# Patient Record
Sex: Male | Born: 1935
Health system: Southern US, Community
[De-identification: ages and names within clinical notes are randomized; demographics above are authoritative.]

## PROBLEM LIST (undated history)

## (undated) DIAGNOSIS — N200 Calculus of kidney: Secondary | ICD-10-CM

## (undated) DIAGNOSIS — E785 Hyperlipidemia, unspecified: Secondary | ICD-10-CM

## (undated) DIAGNOSIS — M1990 Unspecified osteoarthritis, unspecified site: Secondary | ICD-10-CM

## (undated) DIAGNOSIS — K5792 Diverticulitis of intestine, part unspecified, without perforation or abscess without bleeding: Secondary | ICD-10-CM

## (undated) DIAGNOSIS — K589 Irritable bowel syndrome without diarrhea: Secondary | ICD-10-CM

## (undated) DIAGNOSIS — I10 Essential (primary) hypertension: Secondary | ICD-10-CM

## (undated) DIAGNOSIS — K219 Gastro-esophageal reflux disease without esophagitis: Secondary | ICD-10-CM

## (undated) HISTORY — DX: Irritable bowel syndrome, unspecified: K58.9

## (undated) HISTORY — DX: Calculus of kidney: N20.0

## (undated) HISTORY — DX: Essential (primary) hypertension: I10

## (undated) HISTORY — DX: Hyperlipidemia, unspecified: E78.5

## (undated) HISTORY — PX: BACK SURGERY: SHX140

## (undated) HISTORY — DX: Diverticulitis of intestine, part unspecified, without perforation or abscess without bleeding: K57.92

## (undated) HISTORY — DX: Unspecified osteoarthritis, unspecified site: M19.90

## (undated) HISTORY — DX: Gastro-esophageal reflux disease without esophagitis: K21.9

## (undated) HISTORY — PX: OTHER SURGICAL HISTORY: SHX169

---
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
## 1998-11-27 ENCOUNTER — Emergency Department (HOSPITAL_COMMUNITY): Admission: EM | Admit: 1998-11-27 | Discharge: 1998-11-27 | Payer: Self-pay | Admitting: Emergency Medicine

## 1998-11-28 ENCOUNTER — Encounter: Payer: Self-pay | Admitting: Emergency Medicine

## 2000-07-01 ENCOUNTER — Encounter (INDEPENDENT_AMBULATORY_CARE_PROVIDER_SITE_OTHER): Payer: Self-pay | Admitting: Specialist

## 2000-07-01 ENCOUNTER — Other Ambulatory Visit: Admission: RE | Admit: 2000-07-01 | Discharge: 2000-07-01 | Payer: Self-pay | Admitting: Internal Medicine

## 2002-10-08 ENCOUNTER — Encounter: Payer: Self-pay | Admitting: Emergency Medicine

## 2002-10-08 ENCOUNTER — Emergency Department (HOSPITAL_COMMUNITY): Admission: EM | Admit: 2002-10-08 | Discharge: 2002-10-08 | Payer: Self-pay | Admitting: *Deleted

## 2002-10-29 ENCOUNTER — Ambulatory Visit (HOSPITAL_COMMUNITY): Admission: RE | Admit: 2002-10-29 | Discharge: 2002-10-29 | Payer: Self-pay | Admitting: Internal Medicine

## 2004-04-09 ENCOUNTER — Ambulatory Visit: Payer: Self-pay | Admitting: Internal Medicine

## 2004-05-09 ENCOUNTER — Ambulatory Visit: Payer: Self-pay | Admitting: Internal Medicine

## 2004-05-16 ENCOUNTER — Ambulatory Visit: Payer: Self-pay | Admitting: Internal Medicine

## 2004-06-20 ENCOUNTER — Ambulatory Visit: Payer: Self-pay | Admitting: Internal Medicine

## 2004-07-02 ENCOUNTER — Ambulatory Visit: Payer: Self-pay | Admitting: Internal Medicine

## 2004-07-11 ENCOUNTER — Ambulatory Visit: Payer: Self-pay | Admitting: Internal Medicine

## 2004-08-24 ENCOUNTER — Ambulatory Visit: Payer: Self-pay | Admitting: Internal Medicine

## 2004-09-14 ENCOUNTER — Ambulatory Visit: Payer: Self-pay | Admitting: Internal Medicine

## 2004-09-19 ENCOUNTER — Ambulatory Visit: Payer: Self-pay | Admitting: Family Medicine

## 2004-09-21 ENCOUNTER — Ambulatory Visit: Payer: Self-pay | Admitting: Internal Medicine

## 2004-09-25 ENCOUNTER — Ambulatory Visit: Payer: Self-pay | Admitting: Internal Medicine

## 2004-11-23 ENCOUNTER — Ambulatory Visit: Payer: Self-pay | Admitting: Internal Medicine

## 2004-12-31 ENCOUNTER — Ambulatory Visit: Payer: Self-pay | Admitting: Internal Medicine

## 2005-01-02 ENCOUNTER — Ambulatory Visit: Payer: Self-pay | Admitting: Internal Medicine

## 2005-01-07 ENCOUNTER — Ambulatory Visit: Payer: Self-pay | Admitting: Internal Medicine

## 2005-01-11 ENCOUNTER — Ambulatory Visit: Payer: Self-pay | Admitting: Internal Medicine

## 2005-03-25 ENCOUNTER — Ambulatory Visit: Payer: Self-pay | Admitting: Internal Medicine

## 2005-04-10 ENCOUNTER — Ambulatory Visit: Payer: Self-pay | Admitting: Internal Medicine

## 2005-06-14 ENCOUNTER — Ambulatory Visit: Payer: Self-pay | Admitting: Internal Medicine

## 2005-06-27 ENCOUNTER — Ambulatory Visit: Payer: Self-pay | Admitting: Internal Medicine

## 2005-11-05 ENCOUNTER — Ambulatory Visit: Payer: Self-pay | Admitting: Internal Medicine

## 2005-11-19 ENCOUNTER — Ambulatory Visit: Payer: Self-pay | Admitting: Internal Medicine

## 2006-07-18 ENCOUNTER — Ambulatory Visit: Payer: Self-pay | Admitting: Internal Medicine

## 2006-07-18 LAB — CONVERTED CEMR LAB
ALT: 17 units/L (ref 0–40)
AST: 18 units/L (ref 0–37)
Albumin: 3.9 g/dL (ref 3.5–5.2)
Alkaline Phosphatase: 52 units/L (ref 39–117)
BUN: 17 mg/dL (ref 6–23)
Basophils Absolute: 0.4 10*3/uL — ABNORMAL HIGH (ref 0.0–0.1)
Basophils Relative: 4.6 % — ABNORMAL HIGH (ref 0.0–1.0)
Bilirubin, Direct: 0.2 mg/dL (ref 0.0–0.3)
CO2: 25 meq/L (ref 19–32)
Calcium: 9.4 mg/dL (ref 8.4–10.5)
Chloride: 107 meq/L (ref 96–112)
Cholesterol: 208 mg/dL (ref 0–200)
Creatinine, Ser: 1.3 mg/dL (ref 0.4–1.5)
Direct LDL: 106.4 mg/dL
Eosinophils Absolute: 0.3 10*3/uL (ref 0.0–0.6)
Eosinophils Relative: 3.9 % (ref 0.0–5.0)
GFR calc Af Amer: 70 mL/min
GFR calc non Af Amer: 58 mL/min
Glucose, Bld: 92 mg/dL (ref 70–99)
HCT: 42.5 % (ref 39.0–52.0)
HDL: 73 mg/dL (ref >39.0)
Hemoglobin: 14.7 g/dL (ref 13.0–17.0)
Lymphocytes Relative: 25.4 % (ref 12.0–46.0)
MCHC: 34.7 g/dL (ref 30.0–36.0)
MCV: 102.7 fL — ABNORMAL HIGH (ref 78.0–100.0)
Monocytes Absolute: 0.6 10*3/uL (ref 0.2–0.7)
Monocytes Relative: 7.1 % (ref 3.0–11.0)
Neutro Abs: 4.9 10*3/uL (ref 1.4–7.7)
Neutrophils Relative %: 59 % (ref 43.0–77.0)
PSA: 0.9 ng/mL (ref 0.10–4.00)
Platelets: 212 10*3/uL (ref 150–400)
Potassium: 4.6 meq/L (ref 3.5–5.1)
RBC: 4.14 M/uL — ABNORMAL LOW (ref 4.22–5.81)
RDW: 13 % (ref 11.5–14.6)
Sodium: 139 meq/L (ref 135–145)
TSH: 0.89 microintl units/mL (ref 0.35–5.50)
Total Bilirubin: 0.9 mg/dL (ref 0.3–1.2)
Total CHOL/HDL Ratio: 2.8
Total Protein: 6 g/dL (ref 6.0–8.3)
Triglycerides: 138 mg/dL (ref 0–149)
VLDL: 28 mg/dL (ref 0–40)
WBC: 8.3 10*3/uL (ref 4.5–10.5)

## 2006-07-25 ENCOUNTER — Ambulatory Visit: Payer: Self-pay | Admitting: Internal Medicine

## 2006-08-01 DIAGNOSIS — E039 Hypothyroidism, unspecified: Secondary | ICD-10-CM

## 2006-08-01 DIAGNOSIS — E785 Hyperlipidemia, unspecified: Secondary | ICD-10-CM | POA: Insufficient documentation

## 2006-08-01 DIAGNOSIS — K5730 Diverticulosis of large intestine without perforation or abscess without bleeding: Secondary | ICD-10-CM | POA: Insufficient documentation

## 2006-08-11 ENCOUNTER — Ambulatory Visit: Payer: Self-pay | Admitting: Internal Medicine

## 2006-11-13 ENCOUNTER — Ambulatory Visit: Payer: Self-pay | Admitting: Internal Medicine

## 2006-11-21 ENCOUNTER — Ambulatory Visit: Payer: Self-pay | Admitting: Internal Medicine

## 2006-11-21 DIAGNOSIS — C44300 Unspecified malignant neoplasm of skin of unspecified part of face: Secondary | ICD-10-CM | POA: Insufficient documentation

## 2006-11-21 DIAGNOSIS — C44309 Unspecified malignant neoplasm of skin of other parts of face: Secondary | ICD-10-CM | POA: Insufficient documentation

## 2006-11-28 ENCOUNTER — Ambulatory Visit: Payer: Self-pay | Admitting: Internal Medicine

## 2006-12-01 ENCOUNTER — Encounter: Payer: Self-pay | Admitting: Internal Medicine

## 2006-12-03 ENCOUNTER — Telehealth: Payer: Self-pay | Admitting: Internal Medicine

## 2007-04-22 ENCOUNTER — Ambulatory Visit: Payer: Self-pay | Admitting: Internal Medicine

## 2007-04-24 ENCOUNTER — Ambulatory Visit (HOSPITAL_COMMUNITY): Admission: RE | Admit: 2007-04-24 | Discharge: 2007-04-24 | Payer: Self-pay | Admitting: Internal Medicine

## 2007-04-28 ENCOUNTER — Encounter: Payer: Self-pay | Admitting: Internal Medicine

## 2007-04-28 ENCOUNTER — Ambulatory Visit: Payer: Self-pay | Admitting: Internal Medicine

## 2007-05-05 ENCOUNTER — Ambulatory Visit: Payer: Self-pay | Admitting: Internal Medicine

## 2007-05-05 LAB — CONVERTED CEMR LAB
Protein, U semiquant: NEGATIVE
Urobilinogen, UA: 0.2

## 2007-05-06 ENCOUNTER — Ambulatory Visit: Payer: Self-pay | Admitting: Internal Medicine

## 2007-05-06 DIAGNOSIS — T887XXA Unspecified adverse effect of drug or medicament, initial encounter: Secondary | ICD-10-CM | POA: Insufficient documentation

## 2007-05-06 LAB — CONVERTED CEMR LAB: BUN: 16 mg/dL (ref 6–23)

## 2007-05-07 ENCOUNTER — Ambulatory Visit: Payer: Self-pay | Admitting: Cardiology

## 2007-06-04 ENCOUNTER — Ambulatory Visit: Payer: Self-pay | Admitting: Internal Medicine

## 2007-06-05 ENCOUNTER — Ambulatory Visit: Payer: Self-pay | Admitting: Gastroenterology

## 2007-06-05 ENCOUNTER — Telehealth: Payer: Self-pay | Admitting: Gastroenterology

## 2007-06-09 ENCOUNTER — Ambulatory Visit (HOSPITAL_COMMUNITY): Admission: RE | Admit: 2007-06-09 | Discharge: 2007-06-09 | Payer: Self-pay | Admitting: Gastroenterology

## 2007-06-09 ENCOUNTER — Encounter: Payer: Self-pay | Admitting: Gastroenterology

## 2007-06-09 ENCOUNTER — Encounter: Payer: Self-pay | Admitting: Internal Medicine

## 2007-06-15 ENCOUNTER — Ambulatory Visit: Payer: Self-pay | Admitting: Gastroenterology

## 2007-08-19 ENCOUNTER — Telehealth: Payer: Self-pay | Admitting: Internal Medicine

## 2007-08-20 ENCOUNTER — Ambulatory Visit: Payer: Self-pay | Admitting: Internal Medicine

## 2007-08-21 ENCOUNTER — Encounter: Payer: Self-pay | Admitting: Internal Medicine

## 2007-11-13 ENCOUNTER — Ambulatory Visit: Payer: Self-pay | Admitting: Internal Medicine

## 2007-11-25 ENCOUNTER — Telehealth: Payer: Self-pay | Admitting: Internal Medicine

## 2008-05-04 ENCOUNTER — Encounter: Payer: Self-pay | Admitting: Internal Medicine

## 2008-05-17 ENCOUNTER — Encounter: Payer: Self-pay | Admitting: Internal Medicine

## 2008-08-19 ENCOUNTER — Ambulatory Visit: Payer: Self-pay | Admitting: Internal Medicine

## 2008-08-19 LAB — CONVERTED CEMR LAB
Bilirubin Urine: NEGATIVE
Glucose, Urine, Semiquant: NEGATIVE
Ketones, urine, test strip: NEGATIVE
Protein, U semiquant: NEGATIVE
Urobilinogen, UA: 0.2
pH: 5.5

## 2008-08-22 LAB — CONVERTED CEMR LAB
BUN: 19 mg/dL (ref 6–23)
Basophils Absolute: 0.1 10*3/uL (ref 0.0–0.1)
Bilirubin, Direct: 0.1 mg/dL (ref 0.0–0.3)
Chloride: 107 meq/L (ref 96–112)
Cholesterol: 175 mg/dL (ref 0–200)
Creatinine, Ser: 1.4 mg/dL (ref 0.4–1.5)
Eosinophils Absolute: 0.3 10*3/uL (ref 0.0–0.7)
Eosinophils Relative: 3.6 % (ref 0.0–5.0)
Glucose, Bld: 110 mg/dL — ABNORMAL HIGH (ref 70–99)
HCT: 42 % (ref 39.0–52.0)
LDL Cholesterol: 92 mg/dL (ref 0–99)
Lymphs Abs: 1.5 10*3/uL (ref 0.7–4.0)
MCHC: 34.6 g/dL (ref 30.0–36.0)
MCV: 102.9 fL — ABNORMAL HIGH (ref 78.0–100.0)
Monocytes Absolute: 0.6 10*3/uL (ref 0.1–1.0)
Neutrophils Relative %: 65.9 % (ref 43.0–77.0)
PSA: 1.03 ng/mL (ref 0.10–4.00)
Platelets: 207 10*3/uL (ref 150.0–400.0)
RDW: 13.1 % (ref 11.5–14.6)
TSH: 1.65 microintl units/mL (ref 0.35–5.50)
Total Bilirubin: 1.1 mg/dL (ref 0.3–1.2)
Triglycerides: 80 mg/dL (ref 0.0–149.0)
WBC: 7.3 10*3/uL (ref 4.5–10.5)

## 2008-10-10 ENCOUNTER — Encounter: Payer: Self-pay | Admitting: Internal Medicine

## 2008-10-13 ENCOUNTER — Encounter: Payer: Self-pay | Admitting: Internal Medicine

## 2008-10-25 ENCOUNTER — Encounter: Admission: RE | Admit: 2008-10-25 | Discharge: 2008-10-25 | Payer: Self-pay | Admitting: Orthopedic Surgery

## 2008-10-28 ENCOUNTER — Telehealth: Payer: Self-pay | Admitting: Internal Medicine

## 2008-11-08 ENCOUNTER — Encounter: Admission: RE | Admit: 2008-11-08 | Discharge: 2008-11-08 | Payer: Self-pay | Admitting: Orthopedic Surgery

## 2008-12-02 ENCOUNTER — Encounter (INDEPENDENT_AMBULATORY_CARE_PROVIDER_SITE_OTHER): Payer: Self-pay | Admitting: *Deleted

## 2008-12-04 ENCOUNTER — Emergency Department (HOSPITAL_COMMUNITY): Admission: EM | Admit: 2008-12-04 | Discharge: 2008-12-04 | Payer: Self-pay | Admitting: Emergency Medicine

## 2008-12-06 ENCOUNTER — Ambulatory Visit: Payer: Self-pay | Admitting: Internal Medicine

## 2008-12-06 DIAGNOSIS — M12519 Traumatic arthropathy, unspecified shoulder: Secondary | ICD-10-CM | POA: Insufficient documentation

## 2008-12-06 DIAGNOSIS — M48061 Spinal stenosis, lumbar region without neurogenic claudication: Secondary | ICD-10-CM | POA: Insufficient documentation

## 2008-12-13 ENCOUNTER — Telehealth: Payer: Self-pay | Admitting: Internal Medicine

## 2009-01-30 ENCOUNTER — Encounter: Payer: Self-pay | Admitting: Internal Medicine

## 2009-02-08 ENCOUNTER — Encounter: Payer: Self-pay | Admitting: Internal Medicine

## 2009-03-13 ENCOUNTER — Encounter: Payer: Self-pay | Admitting: Internal Medicine

## 2009-03-20 ENCOUNTER — Encounter: Payer: Self-pay | Admitting: Internal Medicine

## 2009-04-24 ENCOUNTER — Encounter: Payer: Self-pay | Admitting: Internal Medicine

## 2009-06-14 DIAGNOSIS — K589 Irritable bowel syndrome without diarrhea: Secondary | ICD-10-CM

## 2009-06-14 DIAGNOSIS — Z8711 Personal history of peptic ulcer disease: Secondary | ICD-10-CM

## 2009-06-14 DIAGNOSIS — Z860100 Personal history of colon polyps, unspecified: Secondary | ICD-10-CM | POA: Insufficient documentation

## 2009-06-14 DIAGNOSIS — K648 Other hemorrhoids: Secondary | ICD-10-CM | POA: Insufficient documentation

## 2009-06-14 DIAGNOSIS — K219 Gastro-esophageal reflux disease without esophagitis: Secondary | ICD-10-CM

## 2009-06-14 DIAGNOSIS — E739 Lactose intolerance, unspecified: Secondary | ICD-10-CM

## 2009-06-14 DIAGNOSIS — K5732 Diverticulitis of large intestine without perforation or abscess without bleeding: Secondary | ICD-10-CM | POA: Insufficient documentation

## 2009-06-14 DIAGNOSIS — Z87442 Personal history of urinary calculi: Secondary | ICD-10-CM

## 2009-06-19 ENCOUNTER — Ambulatory Visit: Payer: Self-pay | Admitting: Internal Medicine

## 2009-07-12 ENCOUNTER — Telehealth: Payer: Self-pay | Admitting: Internal Medicine

## 2009-07-17 ENCOUNTER — Ambulatory Visit: Payer: Self-pay | Admitting: Internal Medicine

## 2009-07-17 DIAGNOSIS — T07XXXA Unspecified multiple injuries, initial encounter: Secondary | ICD-10-CM | POA: Insufficient documentation

## 2009-07-17 DIAGNOSIS — M542 Cervicalgia: Secondary | ICD-10-CM | POA: Insufficient documentation

## 2009-07-27 ENCOUNTER — Encounter: Payer: Self-pay | Admitting: Internal Medicine

## 2009-10-23 ENCOUNTER — Encounter: Payer: Self-pay | Admitting: Internal Medicine

## 2009-11-23 ENCOUNTER — Ambulatory Visit: Payer: Self-pay | Admitting: Internal Medicine

## 2010-02-24 IMAGING — CR DG HUMERUS 2V *L*
3 series · 3 of 3 positions shown · non-contrast
Comparison: None

CLINICAL DATA: Fall.  Left proximal humeral pain.

LEFT HUMERUS - 2+ VIEW

[t humerus ap left (1 of 3)]
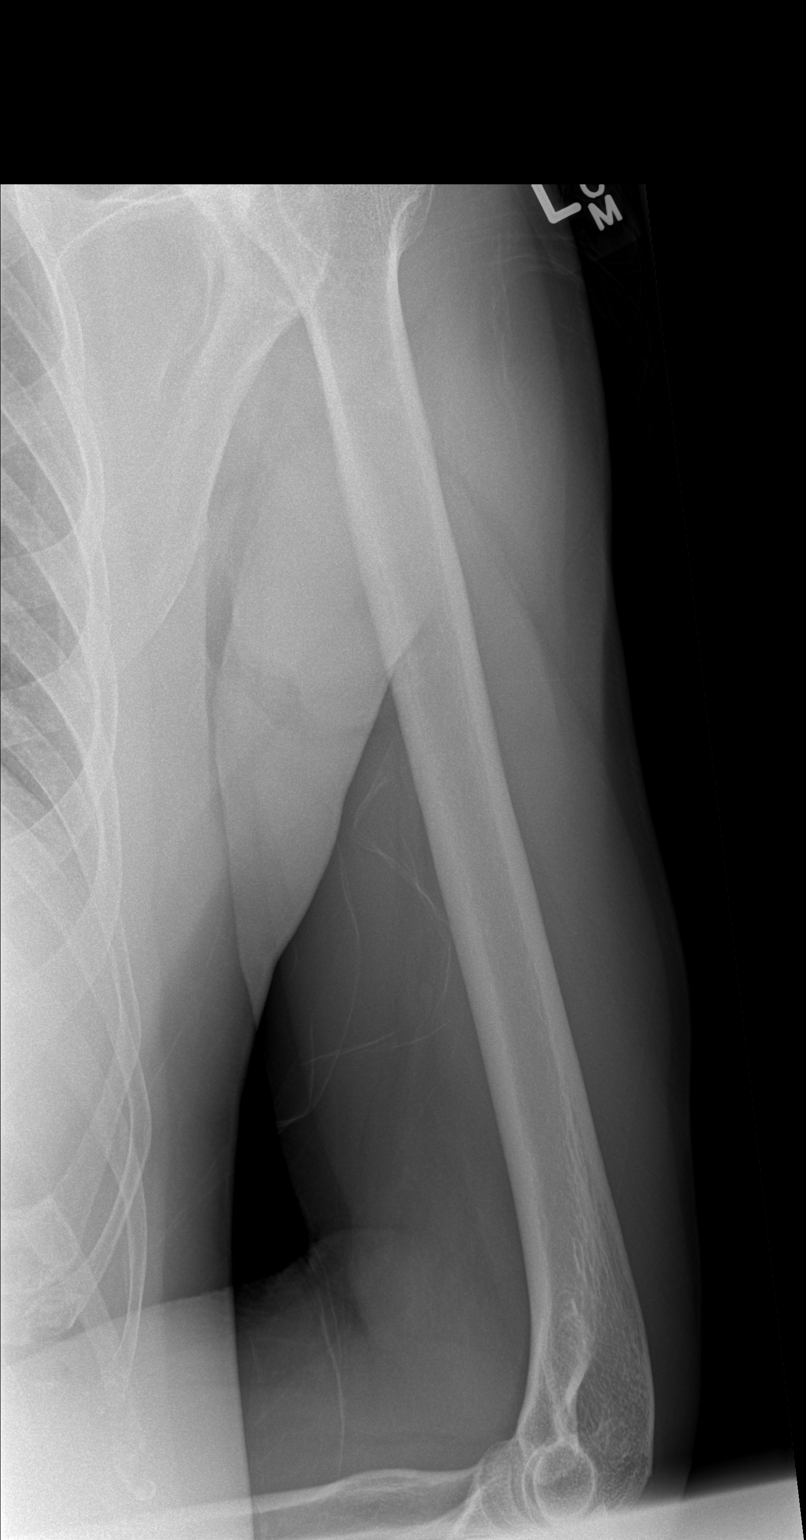

[t humerus ap left (2 of 3)]
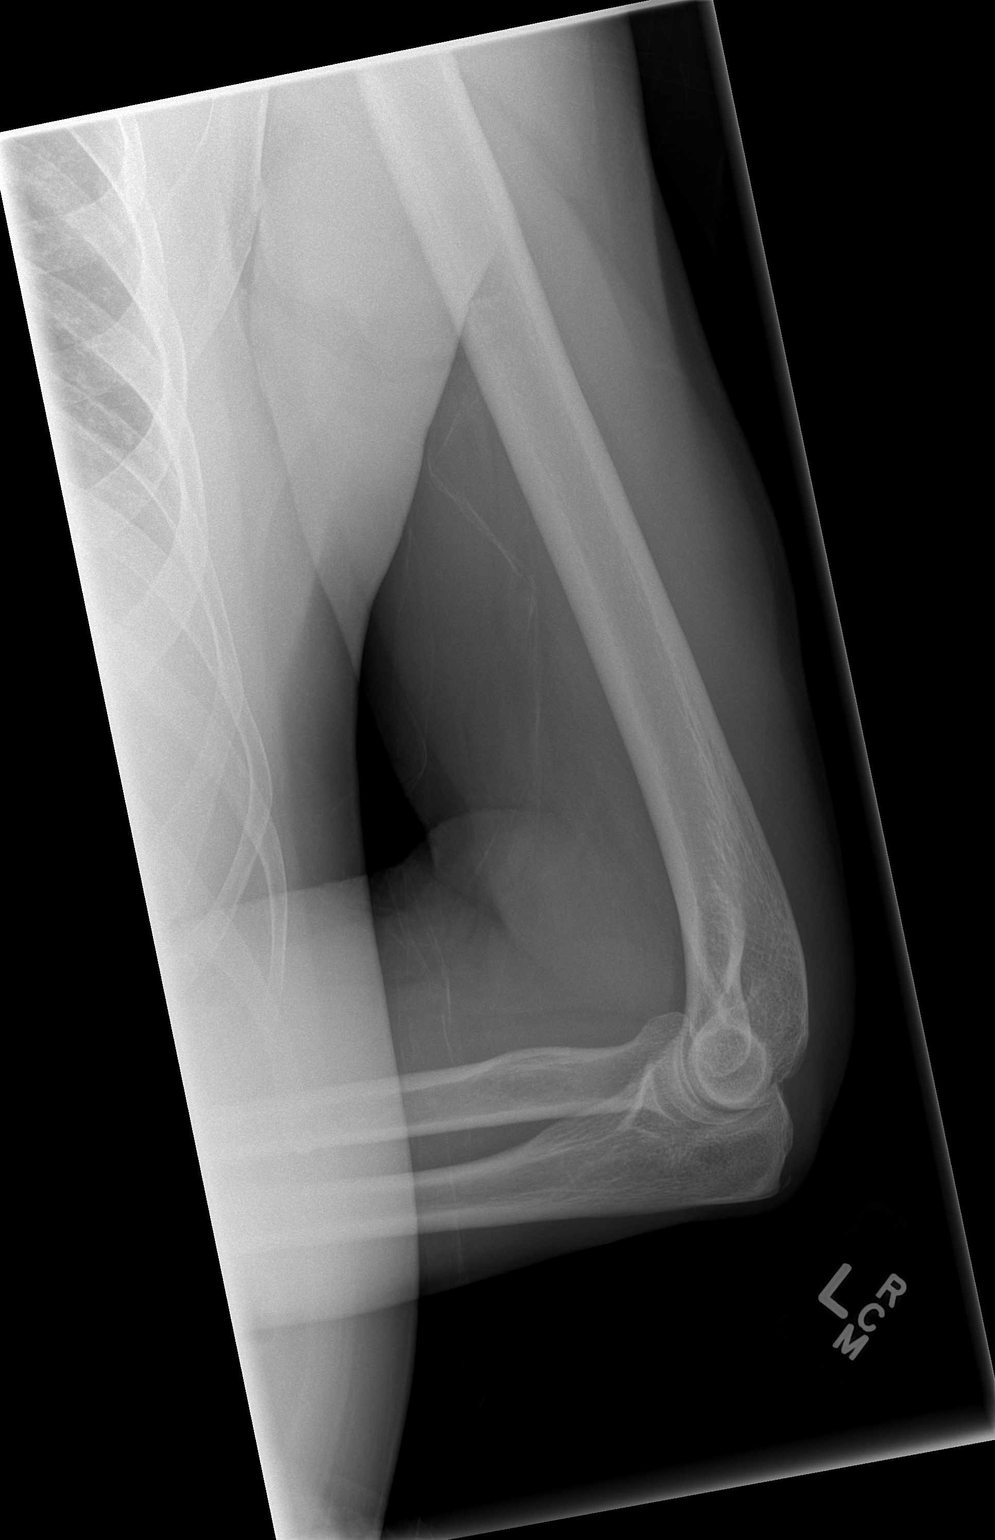

[t humerus ap left (3 of 3)]
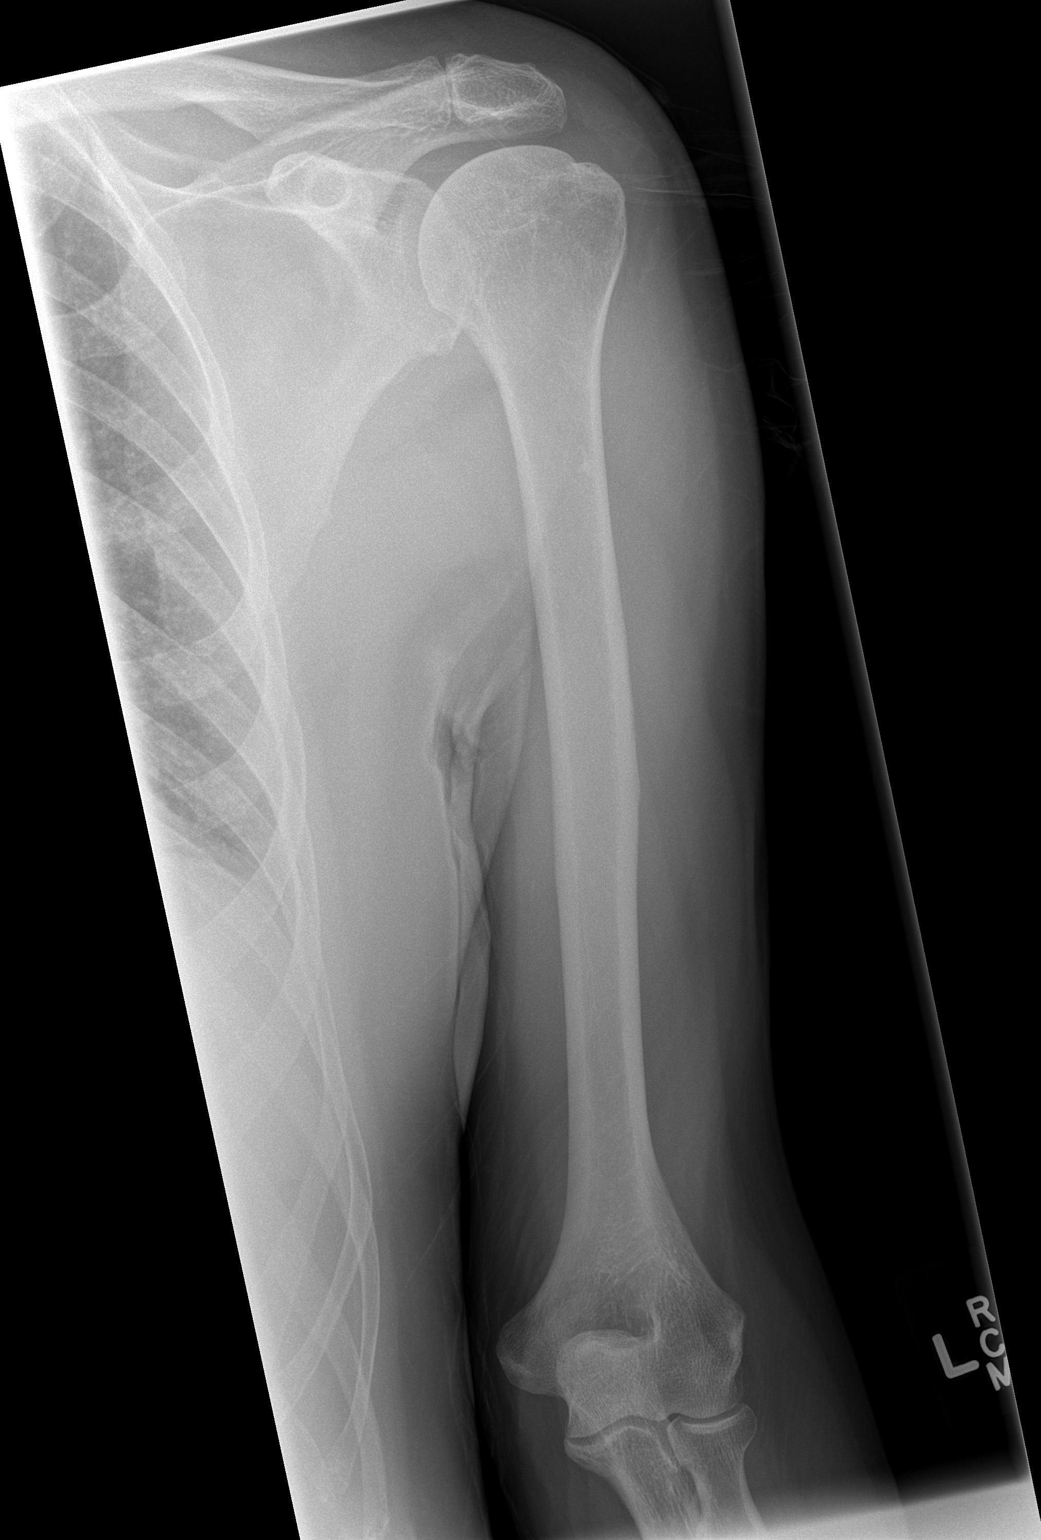

[3 of 3 positions shown; findings below may reference images not displayed]

FINDINGS: No humeral fracture noted.  No specific findings of
shoulder dislocation on this two view series.
IMPRESSION: 1.  No acute bony findings.

## 2010-03-15 NOTE — Assessment & Plan Note (Signed)
Summary: INJURIES FROM A FALL // RS   Vital Signs:  Patient profile:   77 year old male Weight:      155 pounds BMI:     24.36 Pulse rate:   78 / minute Pulse rhythm:   regular BP sitting:   120 / 76  (left arm) Cuff size:   regular  Vitals Entered By: Raechel Ache, RN (July 17, 2009 9:10 AM) CC: Larey Seat down 5 steps on deck yesterday afternoon. C/o pain neck, L shoulder, face, L thigh.   Primary Care Provider:  Birdie Sons, MD  CC:  Larey Seat down 5 steps on deck yesterday afternoon. C/o pain neck, L shoulder, face, and L thigh..  History of Present Illness: fell walking down steps yesterday (he still blames on left leg being numb after surgery).  fell and hit both forearms/face/thighs no weakness main issue now is that neck hurts especially when turning to right no radiation of pain no arm weakness  Reviewed immunization: tetanus UTD   Allergies (verified): No Known Drug Allergies  Past History:  Past Medical History: Last updated: 08/01/2006 Colonic polyps, hx of-adematous Diverticulosis, colon Hyperlipidemia Hypothyroidism congenital R ear deafness  Past Surgical History: Last updated: 07/16/09 Hemorrhoidectomy ulcer surgery  (vagotomy and surgical resection) 1964 T & A Back Surgery---January 2011  Family History: Last updated: 2009/07/16 mother--HTN deceased 59 yo father MI 39 yo sister MI 89 yo Family History of Stomach Cancer: Mother, Paternal Aunt Family History of Heart Disease: Maternal Uncle, Sister, Neice, Father No FH of Colon Cancer:  Social History: Last updated: 07/16/09 Married  Former Smoker--quit 1965 Alcohol Use - yes-very rare Illicit Drug Use - no  Risk Factors: Smoking Status: quit (12/06/2008)  Review of Systems       vision normal,chronic hearing loss All other systems reviewed and were negative   Physical Exam  General:  alert and well-developed.  multiple contusions around face no palpable tenderness to  face Eyes:  bruising around left eye Neck:  No deformities, masses, or tenderness noted. Lungs:  Normal respiratory effort, chest expands symmetrically. Lungs are clear to auscultation, no crackles or wheezes. Heart:  Normal rate and regular rhythm. S1 and S2 normal without gallop, murmur, click, rub or other extra sounds. Abdomen:  Bowel sounds positive,abdomen soft and non-tender without masses, organomegaly or hernias noted. Msk:  pain with rotating head to right DTRs normal   Impression & Recommendations:  Problem # 1:  CONTUSION OF MULTIPLE SITES NEC (ICD-924.8) suspect these will heal without problem  Problem # 2:  NECK PAIN (ICD-723.1)  given fall I'll send for xray suspect soft tissue injury/inflammation  side effects of meds discussed with patient and wife His updated medication list for this problem includes:    Aspirin 81 Mg Tbec (Aspirin) ..... Once daily    Diclofenac Sodium 50 Mg Tbec (Diclofenac sodium) .Marland Kitchen... 1 by mouth 2 times daily. take with food for 2 weeks    Hydrocodone-acetaminophen 5-325 Mg Tabs (Hydrocodone-acetaminophen) .Marland Kitchen... 1 by mouth up to 4 times per day as needed for pain  Orders: T-Cervical Spine Comp w/Flex & Ext (52841LK)  Complete Medication List: 1)  Lisinopril 20 Mg Tabs (Lisinopril) .... Take 1 tablet by mouth once a day 2)  Lipitor 20 Mg Tabs (Atorvastatin calcium) .... Take 1 tablet by mouth once a day 3)  Lorazepam 1 Mg Tabs (Lorazepam) .... Qd  as needed 4)  Aspirin 81 Mg Tbec (Aspirin) .... Once daily 5)  Dicyclomine Hcl 20 Mg  Tabs (Dicyclomine hcl) .... Take 1 tablet by mouth two times a day as needed 6)  Pantoprazole Sodium 40 Mg Tbec (Pantoprazole sodium) .... Take 1 tablet by mouth once a day 7)  Levothyroxine Sodium 50 Mcg Tabs (Levothyroxine sodium) .... One by mouth daily 8)  Tamsulosin Hcl 0.4 Mg Caps (Tamsulosin hcl) .... One capsule by mouth once daily 9)  Diclofenac Sodium 50 Mg Tbec (Diclofenac sodium) .Marland Kitchen.. 1 by mouth 2  times daily. take with food for 2 weeks 10)  Hydrocodone-acetaminophen 5-325 Mg Tabs (Hydrocodone-acetaminophen) .Marland Kitchen.. 1 by mouth up to 4 times per day as needed for pain  Patient Instructions: 1)  we will call you with xray result Prescriptions: HYDROCODONE-ACETAMINOPHEN 5-325 MG TABS (HYDROCODONE-ACETAMINOPHEN) 1 by mouth up to 4 times per day as needed for pain  #30 x 0   Entered and Authorized by:   Birdie Sons MD   Signed by:   Birdie Sons MD on 07/17/2009   Method used:   Print then Give to Patient   RxID:   [id number] DICLOFENAC SODIUM 50 MG TBEC (DICLOFENAC SODIUM) 1 by mouth 2 times daily. take with food for 2 weeks  #30 x 0   Entered and Authorized by:   Birdie Sons MD   Signed by:   Birdie Sons MD on 07/17/2009   Method used:   Electronically to        [facility]* (retail)       [address]       [place], [place]  [address]       Ph: [phone] or [phone]       Fax: [phone]   RxID:   [id number]

## 2010-03-15 NOTE — Letter (Signed)
[facility]: [facility]  [facility]   Imported By: Maryln Gottron 11/21/2009 13:03:41  _____________________________________________________________________  External Attachment:    Type:   Image     Comment:   External Document

## 2010-03-15 NOTE — Consult Note (Signed)
Summary: [facility]  [facility]   Imported By: Maryln Gottron 02/24/2009 08:25:22  _____________________________________________________________________  External Attachment:    Type:   Image     Comment:   External Document

## 2010-03-15 NOTE — Consult Note (Signed)
Summary: [facility]  [facility]   Imported By: Maryln Gottron 03/16/2009 14:22:41  _____________________________________________________________________  External Attachment:    Type:   Image     Comment:   External Document

## 2010-03-15 NOTE — Letter (Signed)
Summary: [facility]  [facility]   Imported By: Maryln Gottron 05/09/2009 15:55:37  _____________________________________________________________________  External Attachment:    Type:   Image     Comment:   External Document

## 2010-03-15 NOTE — Letter (Signed)
Summary: [facility]  [facility]   Imported By: Maryln Gottron 11/07/2009 15:20:38  _____________________________________________________________________  External Attachment:    Type:   Image     Comment:   External Document

## 2010-03-15 NOTE — Letter (Signed)
Summary: [facility]  [facility]   Imported By: Maryln Gottron 03/28/2009 13:20:15  _____________________________________________________________________  External Attachment:    Type:   Image     Comment:   External Document

## 2010-03-15 NOTE — Letter (Signed)
Summary: [facility]  [facility]   Imported By: Maryln Gottron 02/24/2009 09:30:31  _____________________________________________________________________  External Attachment:    Type:   Image     Comment:   External Document

## 2010-03-15 NOTE — Progress Notes (Signed)
Summary: lorazepam  Phone Note Refill Request Message from:  Patient on July 12, 2009 2:41 PM  Refills Requested: Medication #1:  LORAZEPAM 1 MG  TABS qd  as needed   Notes: Please sent Rx to [facility]...Marland KitchenMarland KitchenMarland Kitchen Pt will keep this med / Rx with [facility] because [facility] will not cover.    Initial call taken by: Debbra Riding,  July 12, 2009 2:42 PM  Follow-up for Phone Call        .see Rx. Patient wife notified.  Follow-up by: Gladis Riffle, RN,  July 12, 2009 3:47 PM    Prescriptions: LORAZEPAM 1 MG  TABS (LORAZEPAM) qd  as needed  #30 x 0   Entered by:   Gladis Riffle, RN   Authorized by:   Birdie Sons MD   Signed by:   Gladis Riffle, RN on 07/12/2009   Method used:   Telephoned to ...       [facility] (retail)       [address] [place], [place]  [address]       Ph: [phone] or [phone]       Fax: [phone]   RxID:   [id number]

## 2010-03-15 NOTE — Assessment & Plan Note (Signed)
Summary: RX RENEWAL.Marland KitchenMarland KitchenEM    History of Present Illness Visit Type: Follow-up Visit Primary GI MD: Lina Sar MD Primary Provider: Birdie Sons, MD Requesting Provider: na Chief Complaint: Patient states that he needs a new PPI because Pantoprazole is not working. Pt also c/o diarrhea and continued heartburn and acid reflux History of Present Illness:   This is a 77 year old white male with a history of peptic ulcer disease status post vagotomy and pyloroplasty in 1970s for duodenal ulcer. She has dumping syndrome causing intermittent diarrhea. He also has moderately severe diverticulosis of the left colon documented on his last endoscopy in May 2007. He had an adenomatous polyp in 2002. His small bowel biopsies taken during an endoscopy in 2009 did not show any evidence of sprue. He is on pantoprazole 40 mg a day and levbid 0.375 mg twice a day but he is not sure that it is helping. He has a history of kidney stones and a family history of stomach cancer in his mother.   GI Review of Systems    Reports acid reflux and  heartburn.      Denies abdominal pain, belching, bloating, chest pain, dysphagia with liquids, dysphagia with solids, loss of appetite, nausea, vomiting, vomiting blood, weight loss, and  weight gain.      Reports diarrhea.     Denies anal fissure, black tarry stools, change in bowel habit, constipation, diverticulosis, fecal incontinence, heme positive stool, hemorrhoids, irritable bowel syndrome, jaundice, light color stool, liver problems, rectal bleeding, and  rectal pain.    Current Medications (verified): 1)  Lisinopril 20 Mg  Tabs (Lisinopril) .... Take 1 Tablet By Mouth Once A Day 2)  Lipitor 20 Mg  Tabs (Atorvastatin Calcium) .... Take 1 Tablet By Mouth Once A Day 3)  Lorazepam 1 Mg  Tabs (Lorazepam) .... Qd  As Needed 4)  Aspirin 81 Mg  Tbec (Aspirin) .... Once Daily 5)  Dicyclomine Hcl 20 Mg  Tabs (Dicyclomine Hcl) .... Take 1 Tablet By Mouth Two Times A Day As  Needed 6)  Pantoprazole Sodium 40 Mg  Tbec (Pantoprazole Sodium) .... Take 1 Tablet By Mouth Once A Day 7)  Levothyroxine Sodium 50 Mcg Tabs (Levothyroxine Sodium) .... One By Mouth Daily 8)  Tamsulosin Hcl 0.4 Mg Caps (Tamsulosin Hcl) .... One Capsule By Mouth Once Daily  Allergies (verified): No Known Drug Allergies  Past History:  Past Medical History: Reviewed history from 08/01/2006 and no changes required. Colonic polyps, hx of-adematous Diverticulosis, colon Hyperlipidemia Hypothyroidism congenital R ear deafness  Past Surgical History: Hemorrhoidectomy ulcer surgery  (vagotomy and surgical resection) 1964 T & A Back Surgery---January 2011  Family History: mother--HTN deceased 44 yo father MI 54 yo sister MI 26 yo Family History of Stomach Cancer: Mother, Paternal Aunt Family History of Heart Disease: Maternal Uncle, Sister, Orlie Dakin, Father No FH of Colon Cancer:  Social History: Reviewed history from 06/14/2009 and no changes required. Married  Former Smoker--quit 1965 Alcohol Use - yes-very rare Illicit Drug Use - no  Review of Systems  The patient denies allergy/sinus, anemia, anxiety-new, arthritis/joint pain, back pain, blood in urine, breast changes/lumps, change in vision, confusion, cough, coughing up blood, depression-new, fainting, fatigue, fever, headaches-new, hearing problems, heart murmur, heart rhythm changes, itching, muscle pains/cramps, night sweats, nosebleeds, shortness of breath, skin rash, sleeping problems, sore throat, swelling of feet/legs, swollen lymph glands, thirst - excessive, urination - excessive, urination changes/pain, urine leakage, vision changes, and voice change.  Pertinent positive and negative review of systems were noted in the above HPI. All other ROS was otherwise negative.   Vital Signs:  Patient profile:   77 year old male Height:      67 inches Weight:      160 pounds BMI:     25.15 BSA:     1.84 Pulse rate:    72 / minute Pulse rhythm:   regular BP sitting:   132 / 80  (left arm) Cuff size:   regular  Vitals Entered By: Ok Anis CMA (Jun 19, 2009 3:22 PM)  Physical Exam  General:  Well developed, well nourished, no acute distress. Mouth:  No deformity or lesions, dentition normal. Neck:  Supple; no masses or thyromegaly. Lungs:  Clear throughout to auscultation. Heart:  Regular rate and rhythm; no murmurs, rubs,  or bruits. Abdomen:  Soft, nontender and nondistended. No masses, hepatosplenomegaly or hernias noted. Normal bowel sounds. Rectal:  soft Hemoccult negative stool. Extremities:  No clubbing, cyanosis, edema or deformities noted. Skin:  Intact without significant lesions or rashes. Psych:  Alert and cooperative. Normal mood and affect.   Impression & Recommendations:  Problem # 1:  GERD (ICD-530.81) Patient currently has no symptoms. He is on pantoprazole 40 mg daily. We will refill it.  Problem # 2:  PUD, HX OF (ICD-V12.71) Patient is status post vagotomy and  pyloroplasty in 1970. His diarrhea is likely related to this. He has tried on antispasmodics but does not think it helps. I have instructed him on a dumping diet. He is free to use the levbid on a p.r.n. basis. I have also suggested using Imodium. His recall colonoscopy is due in May 2017.  Patient Instructions: 1)  dumping diet brochure and instructions given. 2)  Recall colonoscopy May 2017. 3)  Pantoprazole 40 mg daily p.r.n. 4)  Copy sent to : Dr Cato Mulligan 5)  The medication list was reviewed and reconciled.  All changed / newly prescribed medications were explained.  A complete medication list was provided to the patient / caregiver. Prescriptions: PANTOPRAZOLE SODIUM 40 MG  TBEC (PANTOPRAZOLE SODIUM) Take 1 tablet by mouth once a day  #30 x 5   Entered by:   Lamona Curl CMA (AAMA)   Authorized by:   Hart Carwin MD   Signed by:   Lamona Curl CMA (AAMA) on 06/19/2009   Method used:    Electronically to        [facility]* (retail)       [address] [place], [place]  [address]       Ph: [phone] or [phone]       Fax: [phone]   RxID:   [id number]

## 2010-03-15 NOTE — Letter (Signed)
Summary: [facility]  [facility]   Imported By: Maryln Gottron 03/01/2009 13:09:50  _____________________________________________________________________  External Attachment:    Type:   Image     Comment:   External Document

## 2010-03-15 NOTE — Letter (Signed)
Summary: [facility]  [facility]   Imported By: Maryln Gottron 08/17/2009 12:26:00  _____________________________________________________________________  External Attachment:    Type:   Image     Comment:   External Document

## 2010-03-15 NOTE — Procedures (Signed)
Summary: COLON   Colonoscopy  Procedure date:  06/27/2005  Findings:      Location:  [facility].   Patient Name: Timothy Garrett, Timothy Garrett MRN:  Procedure Procedures: Colonoscopy CPT: [phone].  Personnel: Endoscopist: Brylinn Teaney L. Juanda Chance, MD.  Exam Location: Exam performed in Outpatient Clinic. Outpatient  Patient Consent: Procedure, Alternatives, Risks and Benefits discussed, consent obtained, from patient. Consent was obtained by the RN.  Indications  Surveillance of: Adenomatous Polyp(s). Initial polypectomy was performed in 2002. in May. 1-2 Polyps were found at Index Exam. Largest polyp removed was 1 to 5 mm. Prior polyp located in distal colon.  History  Current Medications: Patient is not currently taking Coumadin.  Pre-Exam Physical: Performed Jun 27, 2005. Entire physical exam was normal.  Comments: Pt. history reviewed/updated, physical exam performed prior to initiation of sedation?yes Exam Exam: Extent of exam reached: Cecum, extent intended: Cecum.  The cecum was identified by appendiceal orifice and IC valve. Colon retroflexion performed. ASA Classification: II. Tolerance: good.  Monitoring: Pulse and BP monitoring, Oximetry used. Supplemental O2 given.  Colon Prep Used Miralax for colon prep. Prep results: good.  Sedation Meds: Patient assessed and found to be appropriate for moderate (conscious) sedation. Fentanyl 75 mcg. given IV. Versed 7 mg. given IV.  Findings - NORMAL EXAM: Cecum.  - DIVERTICULOSIS: Descending Colon to Sigmoid Colon. ICD9: Diverticulosis: 562.10. Comments: moderately severe, hypertrophied folds,.  - HEMORRHOIDS: Internal. Size: Grade I. ICD9: Hemorrhoids, Internal: 455.0.    Comments: sclope withdrawal time about 8 minutes Assessment Abnormal examination, see findings above.  Diagnoses: 562.10: Diverticulosis.  455.0: Hemorrhoids, Internal.   Comments: no polyps Events  Unplanned Interventions: No intervention  was required.  Unplanned Events: There were no complications. Plans Patient Education: Patient given standard instructions for: Yearly hemoccult testing recommended. Patient instructed to get routine colonoscopy every 10 years.  Disposition: After procedure patient sent to recovery. After recovery patient sent home.   This report was created from the original endoscopy report, which was reviewed and signed by the above listed endoscopist.

## 2010-03-15 NOTE — Assessment & Plan Note (Signed)
Summary: FLU SHOT//[facility]   Nurse Visit   Allergies: No Known Drug Allergies  Orders Added: 1)  Admin 1st Vaccine [90471] 2)  Flu Vaccine 59yrs + [29562]   Flu Vaccine Consent Questions     Do you have a history of severe allergic reactions to this vaccine? no    Any prior history of allergic reactions to egg and/or gelatin? no    Do you have a sensitivity to the preservative Thimersol? no    Do you have a past history of Guillan-Barre Syndrome? no    Do you currently have an acute febrile illness? no    Have you ever had a severe reaction to latex? no    Vaccine information given and explained to patient? yes    Are you currently pregnant? no    Lot Number:[id number]   Exp Date:08/11/2010   Site Given  Left Deltoid IM Romualdo Bolk, CMA (AAMA)  November 23, 2009 2:42 PM

## 2010-03-15 NOTE — Progress Notes (Signed)
Summary: lisinopril, levothyroxine, lipitor  Phone Note Refill Request Message from:  Patient on July 12, 2009 2:37 PM  Refills Requested: Medication #1:  LISINOPRIL 20 MG  TABS Take 1 tablet by mouth once a day   Notes: Pt adv they are switching to [facility] and would like Rx for this med sent if possible.  Medication #2:  LEVOTHYROXINE SODIUM 50 MCG TABS one by mouth daily   Notes: Pt adv they are switching to [facility] and would like Rx for this med sent if possible.  Medication #3:  LIPITOR 20 MG  TABS Take 1 tablet by mouth once a day   Notes: Pt adv they are switching to [facility] and would like Rx for this med sent if possible.    Initial call taken by: Debbra Riding,  July 12, 2009 2:39 PM  Follow-up for Phone Call        see Rx. Patient wife notified.  Follow-up by: Gladis Riffle, RN,  July 12, 2009 3:38 PM    Prescriptions: LEVOTHYROXINE SODIUM 50 MCG TABS (LEVOTHYROXINE SODIUM) one by mouth daily  #90 x 3   Entered by:   Gladis Riffle, RN   Authorized by:   Birdie Sons MD   Signed by:   Gladis Riffle, RN on 07/12/2009   Method used:   Electronically to        [facility]* (mail-order)             ,          Ph: [phone]       Fax: [phone]   RxID:   [id number] LIPITOR 20 MG  TABS (ATORVASTATIN CALCIUM) Take 1 tablet by mouth once a day  # 90 x 3   Entered by:   Gladis Riffle, RN   Authorized by:   Birdie Sons MD   Signed by:   Gladis Riffle, RN on 07/12/2009   Method used:   Electronically to        [facility]* (mail-order)             ,          Ph: [phone]       Fax: [phone]   RxID:   [id number] LISINOPRIL 20 MG  TABS (LISINOPRIL) Take 1 tablet by mouth once a day  #90 x 3   Entered by:   Gladis Riffle, RN   Authorized by:   Birdie Sons MD   Signed by:   Gladis Riffle, RN on 07/12/2009   Method used:   Electronically to        [facility]* (mail-order)             ,          Ph: [phone]       Fax: [phone]   RxID:    [id number]

## 2010-03-15 NOTE — Letter (Signed)
Summary: [facility]  [facility]   Imported By: Maryln Gottron 05/09/2009 15:54:24  _____________________________________________________________________  External Attachment:    Type:   Image     Comment:   External Document

## 2010-04-16 ENCOUNTER — Other Ambulatory Visit: Payer: Self-pay | Admitting: Internal Medicine

## 2010-04-18 ENCOUNTER — Other Ambulatory Visit: Payer: Self-pay | Admitting: Internal Medicine

## 2010-04-18 DIAGNOSIS — F419 Anxiety disorder, unspecified: Secondary | ICD-10-CM

## 2010-04-18 NOTE — Telephone Encounter (Signed)
Triage vm-------wife would like to know why his Lorazepam was denied at the pharmacy? He hasn't been requested to come in to see Dr Cato Mulligan.

## 2010-04-19 MED ORDER — LORAZEPAM 1 MG PO TABS: 1.0000 mg | ORAL_TABLET | Freq: Every day | ORAL | Status: DC | PRN

## 2010-04-19 NOTE — Telephone Encounter (Signed)
rx called in, pt aware 

## 2010-05-17 LAB — URINALYSIS, ROUTINE W REFLEX MICROSCOPIC
Bilirubin Urine: NEGATIVE
Nitrite: NEGATIVE
Specific Gravity, Urine: 1.025 (ref 1.005–1.030)
Urobilinogen, UA: 0.2 mg/dL (ref 0.0–1.0)
pH: 5.5 (ref 5.0–8.0)

## 2010-05-17 LAB — URINE MICROSCOPIC-ADD ON

## 2010-06-05 ENCOUNTER — Other Ambulatory Visit: Payer: Self-pay | Admitting: Internal Medicine

## 2010-06-05 DIAGNOSIS — I10 Essential (primary) hypertension: Secondary | ICD-10-CM

## 2010-06-05 DIAGNOSIS — E039 Hypothyroidism, unspecified: Secondary | ICD-10-CM

## 2010-06-26 NOTE — Assessment & Plan Note (Signed)
[facility]                         GASTROENTEROLOGY OFFICE NOTE   NAME:Timothy Garrett                       MRN:          [id number]  DATE:04/22/2007                            DOB:          November 30, 1931    Timothy Garrett is a very nice 77 year old gentleman, who has a history of  diverticulosis, irritable bowel syndrome and lactose intolerance.  We  have seen him for screening colonoscopies.  The last one in May 2007,  showed moderately severe diverticulosis of the left colon and  hyperplastic polyp of the left colon.  He is here today because of  increasing bloating.  While he is eating or shortly after he eats, his  abdomen swells and he is quite uncomfortable, sometimes nauseated.  He  has occasional heartburn.  His weight has been stable.  His bowel habits  are quite irregular because of history of vagotomy and pyloroplasty by  Dr. __________ in 7829F for what sounds like chronic peptic ulcer  disease of the duodenum.  He has had what resembles dumping syndrome,  which he has managed with dietary modifications and most recently with  Levbid 0.375 mg, which was then switched to dicyclomine, but patient  feels that Levbid worked better.  He denies any rectal bleeding.   MEDICATIONS:  1. Lipitor 10 mg p.o. daily.  2. Synthroid 0.5 mg daily.  3. Lisinopril 10 mg p.o. daily.   PAST HISTORY:  Significant for high blood pressure, high cholesterol,  thyroid problems, kidney stones on the right, vagotomy and pyloroplasty,  hemorrhoid surgery.   FAMILY HISTORY:  Positive for heart disease in his father.   SOCIAL HISTORY:  Married with two children.  He is retired, does not  smoke and does not drink alcohol.   REVIEW OF SYSTEMS:  Weight has been stable.  He wears glasses.  He has  occasional back pain and right-flank pain, hearing problems and  musculoskeletal pain.   PHYSICAL EXAM:  Blood pressure 112/68, pulse 60 and weight 156.8 pounds,  which is  stable for him.  He was alert, oriented, in no distress.  NECK:  Supple, without adenopathy.  Sclera is not icteric.  LUNGS:  Clear to auscultation.  COR:  With normal S1 and normal S2.  ABDOMEN:  Soft with well-healed post exploratory laparotomy scar in mid-  abdomen.  Bowel sounds were normoactive, somewhat hyperactive in upper  abdomen.  Lower abdomen was normal with mild tenderness in epigastrium  to the left of the epigastrium.  No pulsations.  Liver edge at costal  margin.  RECTAL EXAM:  There was soft Hemoccult-negative stool.   IMPRESSION:  Patient is a 77 year old gentleman with progressive  symptoms of bloating and abdominal distention in the setting of prior  vagotomy and pyloroplasty for peptic ulcer disease, which was done more  than 40 years ago.  One would suspect that he may have some degree of  gastroparesis and incomplete gastric emptying.  There is also an  increased incidence of gallbladder disease in post-vagotomy patients  and, with his complaints of abdominal pain, radiating to the right  flank, we need  to rule out possibility of cholelithiasis.  His symptoms  of cramping and bloating also could be related to the post-vagotomy  diarrhea and dumping syndrome.  We need to rule out possibility of  restenosis of the gastric or duodenal outlet or recurrent peptic ulcer  disease.   PLAN:  1. Samples of pantoprazole 40 mg daily.  2. Refill Levbid 0.375 mg twice a day.  3. Upper endoscopy scheduled.  4. Upper abdominal ultrasound.  5. Consider gastric emptying scan, depending on the findings of the      ultrasound, endoscopy and also on the response to the PPIs.     Timothy Garrett. Timothy Chance, MD  Electronically Signed    DMB/MedQ  DD: 04/22/2007  DT: 04/22/2007  Job #: [id number]   cc:   Valetta Mole. Swords, MD

## 2010-07-03 ENCOUNTER — Other Ambulatory Visit: Payer: Self-pay | Admitting: Internal Medicine

## 2010-09-19 ENCOUNTER — Other Ambulatory Visit: Payer: Self-pay | Admitting: Internal Medicine

## 2010-10-01 ENCOUNTER — Encounter: Payer: Self-pay | Admitting: Family Medicine

## 2010-10-01 ENCOUNTER — Ambulatory Visit (INDEPENDENT_AMBULATORY_CARE_PROVIDER_SITE_OTHER): Payer: Medicare Other | Admitting: Family Medicine

## 2010-10-01 VITALS — BP 118/72 | HR 81 | Temp 98.3°F | Wt 155.0 lb

## 2010-10-01 DIAGNOSIS — K5732 Diverticulitis of large intestine without perforation or abscess without bleeding: Secondary | ICD-10-CM

## 2010-10-01 DIAGNOSIS — K5792 Diverticulitis of intestine, part unspecified, without perforation or abscess without bleeding: Secondary | ICD-10-CM

## 2010-10-01 MED ORDER — CIPROFLOXACIN HCL 500 MG PO TABS: 500.0000 mg | ORAL_TABLET | Freq: Two times a day (BID) | ORAL | Status: AC

## 2010-10-01 NOTE — Progress Notes (Signed)
  Subjective:    Patient ID: Timothy Garrett, male    DOB: May 15, 1931, 77 y.o.   MRN: [id number]  HPI Here for 3 days of sharp intermittent LLQ pains and fever to 100 degrees. No urinary symptoms. No nausea. He says it feels like his diverticulitis again. No change in BMs.    Review of Systems  Constitutional: Positive for fever.  Gastrointestinal: Positive for abdominal pain. Negative for nausea, vomiting, diarrhea, constipation, blood in stool, abdominal distention, anal bleeding and rectal pain.  Genitourinary: Negative.        Objective:   Physical Exam  Constitutional: He appears well-developed and well-nourished.  Cardiovascular: Normal rate, regular rhythm, normal heart sounds and intact distal pulses.   Pulmonary/Chest: Effort normal and breath sounds normal.  Abdominal: Soft. Bowel sounds are normal. He exhibits no distension and no mass. There is no rebound and no guarding.       Moderately tender in the LLQ          Assessment & Plan:  Drink fluids. Recheck prn

## 2010-10-05 ENCOUNTER — Other Ambulatory Visit: Payer: Self-pay | Admitting: Internal Medicine

## 2010-10-05 DIAGNOSIS — I10 Essential (primary) hypertension: Secondary | ICD-10-CM

## 2010-10-05 DIAGNOSIS — E039 Hypothyroidism, unspecified: Secondary | ICD-10-CM

## 2010-10-05 MED ORDER — LISINOPRIL 20 MG PO TABS: 20.0000 mg | ORAL_TABLET | Freq: Every day | ORAL | Status: DC

## 2010-10-05 MED ORDER — LEVOTHYROXINE SODIUM 50 MCG PO TABS: 50.0000 ug | ORAL_TABLET | Freq: Every day | ORAL | Status: DC

## 2010-10-05 MED ORDER — ATORVASTATIN CALCIUM 20 MG PO TABS: 20.0000 mg | ORAL_TABLET | Freq: Every day | ORAL | Status: DC

## 2010-10-05 NOTE — Telephone Encounter (Signed)
Pt was in for ov to see Dr Clent Ridges on 10/01/10 because Dr Cato Mulligan was not avail. Pt is req a refill for lisinopril (PRINIVIL,ZESTRIL) 20 MG tablet,atorvastatin 20 mg tab, Levothyroxine 50 mcg,  to be sent in to [facility] order pharmacy.

## 2010-10-08 ENCOUNTER — Telehealth: Payer: Self-pay | Admitting: Internal Medicine

## 2010-10-08 DIAGNOSIS — I10 Essential (primary) hypertension: Secondary | ICD-10-CM

## 2010-10-08 DIAGNOSIS — E039 Hypothyroidism, unspecified: Secondary | ICD-10-CM

## 2010-10-08 MED ORDER — LEVOTHYROXINE SODIUM 50 MCG PO TABS: 50.0000 ug | ORAL_TABLET | Freq: Every day | ORAL | Status: DC

## 2010-10-08 MED ORDER — ATORVASTATIN CALCIUM 20 MG PO TABS: 20.0000 mg | ORAL_TABLET | Freq: Every day | ORAL | Status: DC

## 2010-10-08 MED ORDER — LISINOPRIL 20 MG PO TABS: 20.0000 mg | ORAL_TABLET | Freq: Every day | ORAL | Status: DC

## 2010-10-08 NOTE — Telephone Encounter (Signed)
rx's sent in electronically 

## 2010-10-08 NOTE — Telephone Encounter (Signed)
Please resend his Lipitor, Synthroid and Lisinopril to [facility] order. Thanks.

## 2010-10-10 ENCOUNTER — Other Ambulatory Visit: Payer: Self-pay | Admitting: *Deleted

## 2010-10-10 DIAGNOSIS — E039 Hypothyroidism, unspecified: Secondary | ICD-10-CM

## 2010-10-10 DIAGNOSIS — I10 Essential (primary) hypertension: Secondary | ICD-10-CM

## 2010-10-10 MED ORDER — ATORVASTATIN CALCIUM 20 MG PO TABS: 20.0000 mg | ORAL_TABLET | Freq: Every day | ORAL | Status: DC

## 2010-10-10 MED ORDER — LEVOTHYROXINE SODIUM 50 MCG PO TABS: 50.0000 ug | ORAL_TABLET | Freq: Every day | ORAL | Status: DC

## 2010-10-10 MED ORDER — LISINOPRIL 20 MG PO TABS: 20.0000 mg | ORAL_TABLET | Freq: Every day | ORAL | Status: DC

## 2010-10-10 NOTE — Telephone Encounter (Signed)
Sent in error to [facility].  Sent in [facility] per pt request

## 2010-10-10 NOTE — Telephone Encounter (Signed)
Refill Lipitor, Synthroid,Lisinopril to [facility].

## 2010-10-10 NOTE — Telephone Encounter (Signed)
Pt needs appt then I call in meds

## 2010-11-16 ENCOUNTER — Ambulatory Visit (INDEPENDENT_AMBULATORY_CARE_PROVIDER_SITE_OTHER): Payer: Medicare Other

## 2010-11-16 DIAGNOSIS — Z23 Encounter for immunization: Secondary | ICD-10-CM

## 2011-01-21 ENCOUNTER — Other Ambulatory Visit: Payer: Self-pay | Admitting: Internal Medicine

## 2014-02-11 HISTORY — PX: EYE SURGERY: SHX253

## 2014-09-21 ENCOUNTER — Encounter: Payer: Self-pay | Admitting: Internal Medicine

## 2015-02-01 ENCOUNTER — Encounter: Payer: Self-pay | Admitting: Gastroenterology

## 2015-03-22 ENCOUNTER — Encounter: Payer: Self-pay | Admitting: Gastroenterology

## 2015-03-22 ENCOUNTER — Ambulatory Visit (INDEPENDENT_AMBULATORY_CARE_PROVIDER_SITE_OTHER): Payer: Medicare Other | Admitting: Gastroenterology

## 2015-03-22 VITALS — BP 122/68 | HR 66 | Ht 67.0 in | Wt 157.2 lb

## 2015-03-22 DIAGNOSIS — K5733 Diverticulitis of large intestine without perforation or abscess with bleeding: Secondary | ICD-10-CM

## 2015-03-22 DIAGNOSIS — R1031 Right lower quadrant pain: Secondary | ICD-10-CM | POA: Diagnosis not present

## 2015-03-22 DIAGNOSIS — R935 Abnormal findings on diagnostic imaging of other abdominal regions, including retroperitoneum: Secondary | ICD-10-CM

## 2015-03-22 DIAGNOSIS — R1032 Left lower quadrant pain: Secondary | ICD-10-CM | POA: Diagnosis not present

## 2015-03-22 MED ORDER — METRONIDAZOLE 500 MG PO TABS: 500.0000 mg | ORAL_TABLET | Freq: Three times a day (TID) | ORAL | Status: AC

## 2015-03-22 MED ORDER — CIPROFLOXACIN HCL 500 MG PO TABS: 500.0000 mg | ORAL_TABLET | Freq: Two times a day (BID) | ORAL | Status: AC

## 2015-03-22 NOTE — Patient Instructions (Addendum)
We have sent the following medications to your pharmacy for you to pick up at your convenience: Cipro and Flagyl   Please call back in 2 weeks with an update.  Thank-you for choosing [facility]  Dr Wilfrid Lund III

## 2015-03-22 NOTE — Progress Notes (Signed)
Patient ID: Timothy Garrett, male   DOB: Apr 29, 1931, 82 y.o.   MRN: [id number] Gastroenterology and Hepatology Consult Note:  History: Timothy Garrett 03/22/2015  Referring physician: Chesley Noon, MD  Reason for consult/chief complaint: Diverticulitis   Subjective HPI:  This is an 82 yo man referred after a recent ED visit for LLQ abd pain.  He had about  2 weeks of escalating LLQ pain, went to PCP and saw PA.  They sent him to ED, and reportedly found either heme pos stool or visible blood.  The patient had not seen any blood, and still has not.  All ED reports reviewed Timothy Garrett, Timothy Garrett - seen on Care everywhere).  WBC 14 k, Cr 1.5  LFTs nml, CT scan showed sig diverticulitis.  There were also a few scattered "nodules"  around liver and spleen, no prior for comparison.  rec'd 80 days cipro/flgyl, improved a lot, but never pain free.  Limited historian, unclear if there was chronic abd pain prior to that , carries Dx of IBS, is on dicyclomine.  Saw Timothy Garrett for GERD as well.   Now has occ'l crampy LLQ and RLQ pain.  ROS:  Review of Systems Positive for arthritis, hearing loss, remainder of systems negative except as above.  Past Medical History: Past Medical History  Diagnosis Date  . Diverticulitis   . Arthritis   . Hyperlipemia   . Hypertension   . IBS (irritable bowel syndrome)   . Kidney stones      Past Surgical History: Past Surgical History  Procedure Laterality Date  . Back surgery    . Ulcer surgery     Pyloroplasty/vagotomy in his 28's for PUD  Family History: Family History  Problem Relation Age of Onset  . Stomach cancer Mother   . Heart disease Father     Social History: Social History   Social History  . Marital Status: Married    Spouse Name: Timothy Garrett  . Number of Children: Timothy Garrett  . Years of Education: Timothy Garrett   Social History Main Topics  . Smoking status: Never Smoker   . Smokeless tobacco: Never Used  . Alcohol Use: No  . Drug Use: No  .  Sexual Activity: Not Asked   Other Topics Concern  . None   Social History Narrative    Allergies: No Known Allergies  Outpatient Meds: Current Outpatient Prescriptions  Medication Sig Dispense Refill  . aspirin 81 MG tablet Take 81 mg by mouth daily.    Timothy Garrett Kitchen atorvastatin (LIPITOR) 20 MG tablet Take 1 tablet (20 mg total) by mouth daily. 90 tablet 0  . dicyclomine (BENTYL) 20 MG tablet TAKE 1 TABLET TWICE A DAY AS NEEDED 180 tablet 1  . gabapentin (NEURONTIN) 100 MG capsule Take 100 mg by mouth 3 (three) times daily.    Timothy Garrett Kitchen levothyroxine (SYNTHROID, LEVOTHROID) 50 MCG tablet Take 1 tablet (50 mcg total) by mouth daily. 90 tablet 0  . lisinopril (PRINIVIL,ZESTRIL) 20 MG tablet Take 1 tablet (20 mg total) by mouth daily. 90 tablet 0  . LORazepam (ATIVAN) 1 MG tablet Take 1 tablet (1 mg total) by mouth daily as needed for anxiety. 30 tablet 2  . pantoprazole (PROTONIX) 40 MG tablet TAKE 1 TABLET DAILY 90 tablet 0  . Tamsulosin HCl (FLOMAX) 0.4 MG CAPS Take 0.4 mg by mouth daily.      . ciprofloxacin (CIPRO) 500 MG tablet Take 1 tablet (500 mg total) by mouth 2 (two) times daily. 20 tablet 0  . metroNIDAZOLE (  FLAGYL) 500 MG tablet Take 1 tablet (500 mg total) by mouth 3 (three) times daily. 30 tablet 0   No current facility-administered medications for this visit.      ___________________________________________________________________ Objective  Exam:  BP 122/68 mmHg  Pulse 66  Ht 5\' 7"  (1.702 m)  Wt 71.305 kg (157 lb 3.2 oz)  BMI 24.62 kg/m2  General: this is a well-appearing , elderly white male patient accompanied by daughter  Eyes: sclera anicteric, no redness  ENT: oral mucosa moist without lesions, no cervical or supraclavicular lymphadenopathy, good dentition, HOH  CV: RRR without murmur, S1/S2, no JVD,, no peripheral edema  Resp: clear to auscultation bilaterally, normal RR and effort noted  GI: soft, mild LLQ and RLQ tenderness, with active bowel sounds. No  guarding or palpable organomegaly noted  Skin; warm and dry, no rash or jaundice noted  Neuro: awake, alert and oriented x 3. Normal gross motor function and fluent speech  Labs:  See above. All ED results and notes reviewed Sigmoidoscopy 2009 showed severe sig tics Radiologic Studies:  See above  Assessment: Encounter Diagnoses  Name Primary?  . Diverticulitis of large intestine without perforation or abscess with bleeding Yes  . LLQ pain   . RLQ abdominal pain   . Abnormal CT of the abdomen    Unclear if pain is persistent diverticulitis or underlying IBS. CT "nodules" of unclear significance, likely benign.  Plan: 10 more days cipro and flagyl.  Daughter will call us in 2 weeks with update.  If not better, will repeat CT scan.   Thank you for the courtesy of this consult. 82 y.o  Please call me with any questions or concerns.  Nelida Meuse III

## 2015-04-06 ENCOUNTER — Telehealth: Payer: Self-pay | Admitting: Gastroenterology

## 2015-04-06 NOTE — Telephone Encounter (Addendum)
Pt continues to have abd discomfort on the mid left side, same as the pain before the second course of abx, he finished the course on Saturday and on Monday the pain returned.  The pain has lessened since Monday, but it is still noticeable when pressing on the abdomen.  Please Advise

## 2015-04-06 NOTE — Telephone Encounter (Signed)
I tried to reach the pt on the number provided and the line has been disconnected.   Message left on daughters number listed under emergency contact.

## 2015-04-06 NOTE — Telephone Encounter (Signed)
Needs CT scan abdomen and pelvis to see if persistent diverticulitis. Needs creatinine first to see if OK for IV contrast as well as oral

## 2015-04-07 NOTE — Telephone Encounter (Signed)
I spoke with the pt and he states today that he has no pain and feels better, he says he wants to wait on any further testing.  He will call back if the pain returns.  Forwarded to Dr Loletha Carrow for review.

## 2015-04-07 NOTE — Telephone Encounter (Signed)
Left message on machine to call back  

## 2015-05-30 ENCOUNTER — Encounter: Payer: Self-pay | Admitting: Gastroenterology

## 2016-03-15 DIAGNOSIS — M1711 Unilateral primary osteoarthritis, right knee: Secondary | ICD-10-CM | POA: Diagnosis not present

## 2016-03-22 DIAGNOSIS — M1711 Unilateral primary osteoarthritis, right knee: Secondary | ICD-10-CM | POA: Diagnosis not present

## 2016-06-11 DIAGNOSIS — R1032 Left lower quadrant pain: Secondary | ICD-10-CM | POA: Diagnosis not present

## 2016-06-11 DIAGNOSIS — Z8719 Personal history of other diseases of the digestive system: Secondary | ICD-10-CM | POA: Diagnosis not present

## 2016-06-11 DIAGNOSIS — I10 Essential (primary) hypertension: Secondary | ICD-10-CM | POA: Diagnosis not present

## 2016-06-12 DIAGNOSIS — K5732 Diverticulitis of large intestine without perforation or abscess without bleeding: Secondary | ICD-10-CM | POA: Diagnosis not present

## 2016-06-13 DIAGNOSIS — K5792 Diverticulitis of intestine, part unspecified, without perforation or abscess without bleeding: Secondary | ICD-10-CM | POA: Diagnosis not present

## 2016-06-13 DIAGNOSIS — I10 Essential (primary) hypertension: Secondary | ICD-10-CM | POA: Diagnosis not present

## 2016-06-13 DIAGNOSIS — H6120 Impacted cerumen, unspecified ear: Secondary | ICD-10-CM | POA: Diagnosis not present

## 2016-06-27 DIAGNOSIS — I10 Essential (primary) hypertension: Secondary | ICD-10-CM | POA: Diagnosis not present

## 2016-06-27 DIAGNOSIS — K5792 Diverticulitis of intestine, part unspecified, without perforation or abscess without bleeding: Secondary | ICD-10-CM | POA: Diagnosis not present

## 2016-07-10 DIAGNOSIS — I10 Essential (primary) hypertension: Secondary | ICD-10-CM | POA: Diagnosis not present

## 2016-07-10 DIAGNOSIS — Z125 Encounter for screening for malignant neoplasm of prostate: Secondary | ICD-10-CM | POA: Diagnosis not present

## 2016-07-10 DIAGNOSIS — E785 Hyperlipidemia, unspecified: Secondary | ICD-10-CM | POA: Diagnosis not present

## 2016-07-10 DIAGNOSIS — E039 Hypothyroidism, unspecified: Secondary | ICD-10-CM | POA: Diagnosis not present

## 2016-09-16 DIAGNOSIS — I10 Essential (primary) hypertension: Secondary | ICD-10-CM | POA: Diagnosis not present

## 2016-09-16 DIAGNOSIS — K5792 Diverticulitis of intestine, part unspecified, without perforation or abscess without bleeding: Secondary | ICD-10-CM | POA: Diagnosis not present

## 2016-09-27 DIAGNOSIS — H26493 Other secondary cataract, bilateral: Secondary | ICD-10-CM | POA: Diagnosis not present

## 2016-09-27 DIAGNOSIS — H524 Presbyopia: Secondary | ICD-10-CM | POA: Diagnosis not present

## 2016-09-27 DIAGNOSIS — H43813 Vitreous degeneration, bilateral: Secondary | ICD-10-CM | POA: Diagnosis not present

## 2016-09-27 DIAGNOSIS — H35362 Drusen (degenerative) of macula, left eye: Secondary | ICD-10-CM | POA: Diagnosis not present

## 2016-11-01 DIAGNOSIS — H43813 Vitreous degeneration, bilateral: Secondary | ICD-10-CM | POA: Diagnosis not present

## 2016-11-01 DIAGNOSIS — H26493 Other secondary cataract, bilateral: Secondary | ICD-10-CM | POA: Diagnosis not present

## 2016-11-01 DIAGNOSIS — H35362 Drusen (degenerative) of macula, left eye: Secondary | ICD-10-CM | POA: Diagnosis not present

## 2016-11-18 DIAGNOSIS — Z23 Encounter for immunization: Secondary | ICD-10-CM | POA: Diagnosis not present

## 2016-11-28 ENCOUNTER — Ambulatory Visit: Payer: Self-pay | Admitting: Podiatry

## 2017-01-13 DIAGNOSIS — I10 Essential (primary) hypertension: Secondary | ICD-10-CM | POA: Diagnosis not present

## 2017-01-13 DIAGNOSIS — Z1211 Encounter for screening for malignant neoplasm of colon: Secondary | ICD-10-CM | POA: Diagnosis not present

## 2017-01-13 DIAGNOSIS — E785 Hyperlipidemia, unspecified: Secondary | ICD-10-CM | POA: Diagnosis not present

## 2017-01-13 DIAGNOSIS — E039 Hypothyroidism, unspecified: Secondary | ICD-10-CM | POA: Diagnosis not present

## 2017-01-13 DIAGNOSIS — Z0000 Encounter for general adult medical examination without abnormal findings: Secondary | ICD-10-CM | POA: Diagnosis not present

## 2017-04-28 ENCOUNTER — Other Ambulatory Visit: Payer: Self-pay

## 2017-04-28 ENCOUNTER — Emergency Department (HOSPITAL_COMMUNITY)
Admission: EM | Admit: 2017-04-28 | Discharge: 2017-04-28 | Disposition: A | Discharge status: Home / Self Care | Payer: Medicare Other | Attending: Emergency Medicine | Admitting: Emergency Medicine

## 2017-04-28 ENCOUNTER — Emergency Department (HOSPITAL_COMMUNITY): Payer: Medicare Other

## 2017-04-28 ENCOUNTER — Encounter (HOSPITAL_COMMUNITY): Payer: Self-pay

## 2017-04-28 DIAGNOSIS — I10 Essential (primary) hypertension: Secondary | ICD-10-CM | POA: Diagnosis not present

## 2017-04-28 DIAGNOSIS — Z79899 Other long term (current) drug therapy: Secondary | ICD-10-CM | POA: Diagnosis not present

## 2017-04-28 DIAGNOSIS — M79602 Pain in left arm: Secondary | ICD-10-CM | POA: Diagnosis not present

## 2017-04-28 DIAGNOSIS — M25512 Pain in left shoulder: Secondary | ICD-10-CM | POA: Diagnosis not present

## 2017-04-28 DIAGNOSIS — R51 Headache: Secondary | ICD-10-CM | POA: Diagnosis not present

## 2017-04-28 DIAGNOSIS — R519 Headache, unspecified: Secondary | ICD-10-CM

## 2017-04-28 DIAGNOSIS — Z7982 Long term (current) use of aspirin: Secondary | ICD-10-CM | POA: Insufficient documentation

## 2017-04-28 LAB — CBC
HCT: 40.9 % (ref 39.0–52.0)
Hemoglobin: 13.5 g/dL (ref 13.0–17.0)
MCH: 34.4 pg — ABNORMAL HIGH (ref 26.0–34.0)
MCHC: 33 g/dL (ref 30.0–36.0)
MCV: 104.1 fL — ABNORMAL HIGH (ref 78.0–100.0)
Platelets: 211 10*3/uL (ref 150–400)
RBC: 3.93 MIL/uL — ABNORMAL LOW (ref 4.22–5.81)
RDW: 13.6 % (ref 11.5–15.5)
WBC: 9.1 10*3/uL (ref 4.0–10.5)

## 2017-04-28 LAB — BASIC METABOLIC PANEL
Anion gap: 9 (ref 5–15)
BUN: 15 mg/dL (ref 6–20)
CO2: 25 mmol/L (ref 22–32)
Calcium: 9.1 mg/dL (ref 8.9–10.3)
Chloride: 106 mmol/L (ref 101–111)
Creatinine, Ser: 1.5 mg/dL — ABNORMAL HIGH (ref 0.61–1.24)
GFR calc Af Amer: 48 mL/min — ABNORMAL LOW (ref >60)
GFR calc non Af Amer: 41 mL/min — ABNORMAL LOW (ref >60)
Glucose, Bld: 93 mg/dL (ref 65–99)
Potassium: 4.1 mmol/L (ref 3.5–5.1)
Sodium: 140 mmol/L (ref 135–145)

## 2017-04-28 LAB — I-STAT TROPONIN, ED: Troponin i, poc: 0 ng/mL (ref 0.00–0.08)

## 2017-04-28 LAB — SEDIMENTATION RATE: Sed Rate: 10 mm/hr (ref 0–16)

## 2017-04-28 LAB — TROPONIN I: Troponin I: <0.03 ng/mL (ref <0.03)

## 2017-04-28 LAB — C-REACTIVE PROTEIN: CRP: 2.2 mg/dL — ABNORMAL HIGH (ref <1.0)

## 2017-04-28 MED ORDER — PREDNISONE 20 MG PO TABS
60.0000 mg | ORAL_TABLET | Freq: Once | ORAL | Status: AC
  Administered 2017-04-28: 60 mg via ORAL
  Filled 2017-04-28: qty 3

## 2017-04-28 MED ORDER — PREDNISONE 50 MG PO TABS: 50.0000 mg | ORAL_TABLET | Freq: Every day | ORAL | 0 refills | Status: DC

## 2017-04-28 MED ORDER — ACETAMINOPHEN 500 MG PO TABS
1000.0000 mg | ORAL_TABLET | Freq: Once | ORAL | Status: AC
Start: 2017-04-28 — End: 2017-04-28
  Administered 2017-04-28: 1000 mg via ORAL
  Filled 2017-04-28: qty 2

## 2017-04-28 NOTE — ED Notes (Signed)
Patient verbalizes understanding of discharge instructions. Opportunity for questioning and answers were provided. Armband removed by staff, pt discharged from ED ambulatory.   

## 2017-04-28 NOTE — ED Triage Notes (Signed)
Pt endorses jaw pain radiating down the left arm x 3 days. Denies CP or any other sx. Sent by Dr For EKG. VSS.

## 2017-04-28 NOTE — ED Provider Notes (Signed)
[facility] EMERGENCY DEPARTMENT Provider Note   CSN: [id number] Arrival date & time: 04/28/17  1530     History   Chief Complaint Chief Complaint  Patient presents with  . Jaw Pain  . Shoulder Pain    HPI Timothy Garrett is a 84 y.o. male.  HPI   84 year old male with headache and jaw pain.  Symptom onset about 3 days ago.  Pain is in the left forehead, left retro-orbital and left temporal region.  Increased with talking and chewing food.  No acute visual changes. Also intermittent pain in L arm. No fever or chills. No hx of similar symptoms. No known cardiac history.   Past Medical History:  Diagnosis Date  . Arthritis   . Diverticulitis   . Hyperlipemia   . Hypertension   . IBS (irritable bowel syndrome)   . Kidney stones     Patient Active Problem List   Diagnosis Date Noted  . NECK PAIN 07/17/2009  . CONTUSION OF MULTIPLE SITES NEC 07/17/2009  . LACTOSE INTOLERANCE 06/14/2009  . INTERNAL HEMORRHOIDS 06/14/2009  . GERD 06/14/2009  . DIVERTICULITIS, COLON 06/14/2009  . IRRITABLE BOWEL SYNDROME 06/14/2009  . PUD, HX OF 06/14/2009  . COLONIC POLYPS, HYPERPLASTIC, HX OF 06/14/2009  . NEPHROLITHIASIS, HX OF 06/14/2009  . TRAUMATIC ARTHROPATHY SHOULDER REGION 12/06/2008  . SPINAL STENOSIS, LUMBAR 12/06/2008  . UNS ADVRS EFF UNS RX MEDICINAL&BIOLOGICAL SBSTNC 05/06/2007  . NEOP, MALIGNANT, SKIN, FACE NEC 11/21/2006  . NEOP, MALIGNANT, SKIN, FACE NEC 11/21/2006  . HYPOTHYROIDISM 08/01/2006  . HYPERLIPIDEMIA 08/01/2006  . DIVERTICULOSIS, COLON 08/01/2006    Past Surgical History:  Procedure Laterality Date  . BACK SURGERY    . ulcer surgery         Home Medications    Prior to Admission medications   Medication Sig Start Date End Date Taking? Authorizing Provider  aspirin 81 MG tablet Take 81 mg by mouth daily.    [provider]  atorvastatin (LIPITOR) 20 MG tablet Take 1 tablet (20 mg total) by mouth daily. 10/10/10   Swords,  Darrick Penna, MD  dicyclomine (BENTYL) 20 MG tablet TAKE 1 TABLET TWICE A DAY AS NEEDED 01/21/11   Lafayette Dragon, MD  gabapentin (NEURONTIN) 100 MG capsule Take 100 mg by mouth 3 (three) times daily.    [provider]  levothyroxine (SYNTHROID, LEVOTHROID) 50 MCG tablet Take 1 tablet (50 mcg total) by mouth daily. 10/10/10   Swords, Darrick Penna, MD  lisinopril (PRINIVIL,ZESTRIL) 20 MG tablet Take 1 tablet (20 mg total) by mouth daily. 10/10/10   Swords, Darrick Penna, MD  LORazepam (ATIVAN) 1 MG tablet Take 1 tablet (1 mg total) by mouth daily as needed for anxiety. 04/19/10   Swords, Darrick Penna, MD  pantoprazole (PROTONIX) 40 MG tablet TAKE 1 TABLET DAILY 09/19/10   Lafayette Dragon, MD  Tamsulosin HCl (FLOMAX) 0.4 MG CAPS Take 0.4 mg by mouth daily.      [provider]    Family History Family History  Problem Relation Age of Onset  . Stomach cancer Mother   . Heart disease Father     Social History Social History   Tobacco Use  . Smoking status: Never Smoker  . Smokeless tobacco: Never Used  Substance Use Topics  . Alcohol use: No  . Drug use: No     Allergies   Patient has no known allergies.   Review of Systems Review of Systems  All systems reviewed and  negative, other than as noted in HPI.  Physical Exam Updated Vital Signs BP 130/71   Pulse 62   Temp 97.9 F (36.6 C) (Oral)   Resp 13   SpO2 98%   Physical Exam  Constitutional: He appears well-developed and well-nourished. No distress.  HENT:  Head: Normocephalic and atraumatic.  TTP focally over temporal artery. Pulse feels symmetric as compared to R though. No palpable beading.   Eyes: Conjunctivae are normal. Right eye exhibits no discharge. Left eye exhibits no discharge.  Neck: Neck supple.  Cardiovascular: Normal rate, regular rhythm and normal heart sounds. Exam reveals no gallop and no friction rub.  No murmur heard. Pulmonary/Chest: Effort normal and breath sounds normal. No respiratory distress.    Abdominal: Soft. He exhibits no distension. There is no tenderness.  Musculoskeletal: He exhibits no edema or tenderness.  Neurological: He is alert.  Skin: Skin is warm and dry.  Psychiatric: He has a normal mood and affect. His behavior is normal. Thought content normal.  Nursing note and vitals reviewed.    ED Treatments / Results  Labs (all labs ordered are listed, but only abnormal results are displayed) Labs Reviewed  BASIC METABOLIC PANEL - Abnormal; Notable for the following components:      Result Value   Creatinine, Ser 1.50 (*)    GFR calc non Af Amer 41 (*)    GFR calc Af Amer 48 (*)    All other components within normal limits  CBC - Abnormal; Notable for the following components:   RBC 3.93 (*)    MCV 104.1 (*)    MCH 34.4 (*)    All other components within normal limits  C-REACTIVE PROTEIN - Abnormal; Notable for the following components:   CRP 2.2 (*)    All other components within normal limits  SEDIMENTATION RATE  TROPONIN I  I-STAT TROPONIN, ED    EKG  EKG Interpretation  Date/Time:  Monday April 28 2017 15:51:29 EDT Ventricular Rate:  74 PR Interval:  170 QRS Duration: 86 QT Interval:  376 QTC Calculation: 417 R Axis:   49 Text Interpretation:  Sinus rhythm with occasional Premature ventricular complexes Otherwise normal ECG Confirmed by Virgel Manifold [id number]) on 04/28/2017 7:29:44 PM       Radiology Dg Chest 2 View  Result Date: 04/28/2017 CLINICAL DATA:  Left arm pain for several days, initial encounter EXAM: CHEST - 2 VIEW COMPARISON:  None. FINDINGS: Cardiac shadow is within normal limits. Mild aortic calcifications are noted. The lungs are well aerated bilaterally. No focal infiltrate or sizable effusion is seen. No acute bony abnormality is noted. IMPRESSION: No acute abnormality seen. Electronically Signed   By: Inez Catalina M.D.   On: 04/28/2017 16:48    Procedures Procedures (including critical care time)  Medications Ordered in  ED Medications - No data to display   Initial Impression / Assessment and Plan / ED Course  I have reviewed the triage vital signs and the nursing notes.  Pertinent labs & imaging results that were available during my care of the patient were reviewed by me and considered in my medical decision making (see chart for details).     84 year old male who clinically I suspect has giant cell arteritis.  Left sided headache.  He reports jaw claudication.  Interestingly, he also reports intermittent left upper extremity pain. Arm claudication can potentially seen in giant cell arteritis although not very commonly.  We will check an ESR and CRP.  If elevated he  will be placed on high-dose steroids and follow-up for temporal artery biopsy.  I doubt a cardiac etiology.  Atypical symptoms.  EKG not suggestive.  Troponins normals.    ESR normal. CRP only minimally elevated. Probably makes GCA a lot less likely but doesn't rule out. Plan is still the same. I still have low suspicion for alternative emergent diagnosis   Final Clinical Impressions(s) / ED Diagnoses   Final diagnoses:  Acute nonintractable headache, unspecified headache type    ED Discharge Orders    None       Virgel Manifold, MD 04/29/17 1709

## 2017-05-07 DIAGNOSIS — M316 Other giant cell arteritis: Secondary | ICD-10-CM | POA: Diagnosis not present

## 2017-05-07 DIAGNOSIS — I10 Essential (primary) hypertension: Secondary | ICD-10-CM | POA: Diagnosis not present

## 2017-05-12 DIAGNOSIS — R6884 Jaw pain: Secondary | ICD-10-CM | POA: Diagnosis not present

## 2017-05-26 ENCOUNTER — Other Ambulatory Visit: Payer: Self-pay

## 2017-05-26 DIAGNOSIS — M316 Other giant cell arteritis: Secondary | ICD-10-CM

## 2017-06-02 DIAGNOSIS — I10 Essential (primary) hypertension: Secondary | ICD-10-CM | POA: Diagnosis not present

## 2017-06-02 DIAGNOSIS — R300 Dysuria: Secondary | ICD-10-CM | POA: Diagnosis not present

## 2017-06-02 DIAGNOSIS — R509 Fever, unspecified: Secondary | ICD-10-CM | POA: Diagnosis not present

## 2017-06-02 DIAGNOSIS — N410 Acute prostatitis: Secondary | ICD-10-CM | POA: Diagnosis not present

## 2017-06-04 DIAGNOSIS — I10 Essential (primary) hypertension: Secondary | ICD-10-CM | POA: Diagnosis not present

## 2017-06-04 DIAGNOSIS — D649 Anemia, unspecified: Secondary | ICD-10-CM | POA: Diagnosis not present

## 2017-06-04 DIAGNOSIS — N419 Inflammatory disease of prostate, unspecified: Secondary | ICD-10-CM | POA: Diagnosis not present

## 2017-06-12 ENCOUNTER — Encounter (HOSPITAL_COMMUNITY): Payer: Medicare Other

## 2017-06-12 ENCOUNTER — Encounter: Payer: Medicare Other | Admitting: Vascular Surgery

## 2017-06-19 DIAGNOSIS — I10 Essential (primary) hypertension: Secondary | ICD-10-CM | POA: Diagnosis not present

## 2017-06-19 DIAGNOSIS — R600 Localized edema: Secondary | ICD-10-CM | POA: Diagnosis not present

## 2017-06-19 DIAGNOSIS — M79662 Pain in left lower leg: Secondary | ICD-10-CM | POA: Diagnosis not present

## 2017-07-14 DIAGNOSIS — M5441 Lumbago with sciatica, right side: Secondary | ICD-10-CM | POA: Diagnosis not present

## 2017-07-14 DIAGNOSIS — D367 Benign neoplasm of other specified sites: Secondary | ICD-10-CM | POA: Diagnosis not present

## 2017-07-14 DIAGNOSIS — I10 Essential (primary) hypertension: Secondary | ICD-10-CM | POA: Diagnosis not present

## 2017-07-14 DIAGNOSIS — E039 Hypothyroidism, unspecified: Secondary | ICD-10-CM | POA: Diagnosis not present

## 2017-07-21 DIAGNOSIS — I10 Essential (primary) hypertension: Secondary | ICD-10-CM | POA: Diagnosis not present

## 2017-07-21 DIAGNOSIS — R2242 Localized swelling, mass and lump, left lower limb: Secondary | ICD-10-CM | POA: Diagnosis not present

## 2017-07-29 DIAGNOSIS — R2242 Localized swelling, mass and lump, left lower limb: Secondary | ICD-10-CM | POA: Diagnosis not present

## 2017-08-04 DIAGNOSIS — R2242 Localized swelling, mass and lump, left lower limb: Secondary | ICD-10-CM | POA: Diagnosis not present

## 2017-08-04 DIAGNOSIS — M7989 Other specified soft tissue disorders: Secondary | ICD-10-CM | POA: Diagnosis not present

## 2017-08-04 DIAGNOSIS — I10 Essential (primary) hypertension: Secondary | ICD-10-CM | POA: Diagnosis not present

## 2017-08-07 DIAGNOSIS — L03116 Cellulitis of left lower limb: Secondary | ICD-10-CM | POA: Diagnosis not present

## 2017-08-07 DIAGNOSIS — I10 Essential (primary) hypertension: Secondary | ICD-10-CM | POA: Diagnosis not present

## 2017-08-11 DIAGNOSIS — I10 Essential (primary) hypertension: Secondary | ICD-10-CM | POA: Diagnosis not present

## 2017-08-11 DIAGNOSIS — L03116 Cellulitis of left lower limb: Secondary | ICD-10-CM | POA: Diagnosis not present

## 2017-08-11 DIAGNOSIS — R2242 Localized swelling, mass and lump, left lower limb: Secondary | ICD-10-CM | POA: Diagnosis not present

## 2017-08-18 DIAGNOSIS — I10 Essential (primary) hypertension: Secondary | ICD-10-CM | POA: Diagnosis not present

## 2017-08-18 DIAGNOSIS — L03116 Cellulitis of left lower limb: Secondary | ICD-10-CM | POA: Diagnosis not present

## 2017-08-22 DIAGNOSIS — R2242 Localized swelling, mass and lump, left lower limb: Secondary | ICD-10-CM | POA: Diagnosis not present

## 2017-08-22 DIAGNOSIS — I10 Essential (primary) hypertension: Secondary | ICD-10-CM | POA: Diagnosis not present

## 2017-09-05 DIAGNOSIS — Z09 Encounter for follow-up examination after completed treatment for conditions other than malignant neoplasm: Secondary | ICD-10-CM | POA: Diagnosis not present

## 2017-09-25 DIAGNOSIS — I10 Essential (primary) hypertension: Secondary | ICD-10-CM | POA: Diagnosis not present

## 2017-09-25 DIAGNOSIS — K5792 Diverticulitis of intestine, part unspecified, without perforation or abscess without bleeding: Secondary | ICD-10-CM | POA: Diagnosis not present

## 2017-09-30 DIAGNOSIS — K5792 Diverticulitis of intestine, part unspecified, without perforation or abscess without bleeding: Secondary | ICD-10-CM | POA: Diagnosis not present

## 2017-09-30 DIAGNOSIS — I10 Essential (primary) hypertension: Secondary | ICD-10-CM | POA: Diagnosis not present

## 2017-11-04 DIAGNOSIS — H26493 Other secondary cataract, bilateral: Secondary | ICD-10-CM | POA: Diagnosis not present

## 2017-11-04 DIAGNOSIS — H43813 Vitreous degeneration, bilateral: Secondary | ICD-10-CM | POA: Diagnosis not present

## 2017-11-04 DIAGNOSIS — H35362 Drusen (degenerative) of macula, left eye: Secondary | ICD-10-CM | POA: Diagnosis not present

## 2017-11-04 DIAGNOSIS — H35371 Puckering of macula, right eye: Secondary | ICD-10-CM | POA: Diagnosis not present

## 2017-11-27 DIAGNOSIS — Z23 Encounter for immunization: Secondary | ICD-10-CM | POA: Diagnosis not present

## 2017-12-29 DIAGNOSIS — M7551 Bursitis of right shoulder: Secondary | ICD-10-CM | POA: Diagnosis not present

## 2017-12-29 DIAGNOSIS — I10 Essential (primary) hypertension: Secondary | ICD-10-CM | POA: Diagnosis not present

## 2018-01-06 DIAGNOSIS — N390 Urinary tract infection, site not specified: Secondary | ICD-10-CM | POA: Diagnosis not present

## 2018-01-06 DIAGNOSIS — I10 Essential (primary) hypertension: Secondary | ICD-10-CM | POA: Diagnosis not present

## 2018-01-06 DIAGNOSIS — R300 Dysuria: Secondary | ICD-10-CM | POA: Diagnosis not present

## 2018-01-12 DIAGNOSIS — E039 Hypothyroidism, unspecified: Secondary | ICD-10-CM | POA: Diagnosis not present

## 2018-01-12 DIAGNOSIS — N390 Urinary tract infection, site not specified: Secondary | ICD-10-CM | POA: Diagnosis not present

## 2018-01-12 DIAGNOSIS — E785 Hyperlipidemia, unspecified: Secondary | ICD-10-CM | POA: Diagnosis not present

## 2018-01-12 DIAGNOSIS — I10 Essential (primary) hypertension: Secondary | ICD-10-CM | POA: Diagnosis not present

## 2018-01-20 DIAGNOSIS — N390 Urinary tract infection, site not specified: Secondary | ICD-10-CM | POA: Diagnosis not present

## 2018-01-20 DIAGNOSIS — R319 Hematuria, unspecified: Secondary | ICD-10-CM | POA: Diagnosis not present

## 2018-01-20 DIAGNOSIS — R300 Dysuria: Secondary | ICD-10-CM | POA: Diagnosis not present

## 2018-01-20 DIAGNOSIS — I10 Essential (primary) hypertension: Secondary | ICD-10-CM | POA: Diagnosis not present

## 2018-01-27 DIAGNOSIS — I10 Essential (primary) hypertension: Secondary | ICD-10-CM | POA: Diagnosis not present

## 2018-02-24 ENCOUNTER — Other Ambulatory Visit: Payer: Self-pay

## 2018-02-24 ENCOUNTER — Encounter: Payer: Self-pay | Admitting: Family Medicine

## 2018-02-24 ENCOUNTER — Ambulatory Visit: Payer: Medicare Other | Admitting: Family Medicine

## 2018-02-24 VITALS — BP 112/68 | HR 75 | Temp 97.9°F | Ht 65.25 in | Wt 157.3 lb

## 2018-02-24 DIAGNOSIS — K5730 Diverticulosis of large intestine without perforation or abscess without bleeding: Secondary | ICD-10-CM | POA: Diagnosis not present

## 2018-02-24 DIAGNOSIS — K219 Gastro-esophageal reflux disease without esophagitis: Secondary | ICD-10-CM | POA: Diagnosis not present

## 2018-02-24 DIAGNOSIS — N400 Enlarged prostate without lower urinary tract symptoms: Secondary | ICD-10-CM

## 2018-02-24 DIAGNOSIS — Z87442 Personal history of urinary calculi: Secondary | ICD-10-CM

## 2018-02-24 DIAGNOSIS — E039 Hypothyroidism, unspecified: Secondary | ICD-10-CM

## 2018-02-24 DIAGNOSIS — E785 Hyperlipidemia, unspecified: Secondary | ICD-10-CM

## 2018-02-24 NOTE — Patient Instructions (Signed)
Consider setting up Medicare Wellness visit with our Health Coach.

## 2018-02-24 NOTE — Progress Notes (Signed)
Subjective:     Patient ID: Timothy Garrett, male   DOB: [birth year], 85 y.o.   MRN: [id number]  HPI Patient is seen new to establish care.  He has chronic problems including history of reported irritable bowel syndrome, GERD, diverticulosis of the colon, hypothyroidism, BPH, history of lumbar stenosis, history of kidney stones, remote history of peptic ulcer disease, hyperlipidemia.    Patient is originally from [facility].  He states his mom died age 104 of reported complications of hypertension.  Dad died reportedly of MI at age 89.  Patient himself has never had any cardiac issues.  Non-smoker  Patient states he has had a couple of diverticulitis flares in the past year.  Overall though doing well.  He takes gabapentin at night reportedly for history of severe cramps.  Denies any adverse side effects.  All medications reviewed.  Compliant with all.  He had labs done in December from prior practice and these were reviewed.  His lipids were well controlled.  His thyroid was at goal.  Past Medical History:  Diagnosis Date  . Arthritis   . Diverticulitis   . Diverticulitis   . GERD (gastroesophageal reflux disease)    Heartburn  . Hyperlipemia   . Hypertension   . IBS (irritable bowel syndrome)   . Kidney stones    Past Surgical History:  Procedure Laterality Date  . BACK SURGERY    . TONSILLECTOMY  1941  . ulcer surgery      reports that he has quit smoking. He has never used smokeless tobacco. He reports that he does not drink alcohol or use drugs. family history includes Heart attack in his father, paternal grandfather, and sister; Heart disease in his father; Hypertension in his mother; Stomach cancer in his mother; Stroke in his maternal grandfather. Allergies  Allergen Reactions  . Sulfamethoxazole-Trimethoprim Itching     Review of Systems  Constitutional: Negative for appetite change, fatigue and unexpected weight change.  Eyes: Negative for visual  disturbance.  Respiratory: Negative for cough, chest tightness and shortness of breath.   Cardiovascular: Negative for chest pain, palpitations and leg swelling.  Gastrointestinal: Negative for abdominal pain.  Endocrine: Negative for polydipsia and polyuria.  Neurological: Negative for dizziness, syncope, weakness, light-headedness and headaches.  Psychiatric/Behavioral: Negative for dysphoric mood.       Objective:   Physical Exam Constitutional:      Appearance: He is well-developed.  HENT:     Right Ear: External ear normal.     Left Ear: External ear normal.  Eyes:     Pupils: Pupils are equal, round, and reactive to light.  Neck:     Musculoskeletal: Neck supple.     Thyroid: No thyromegaly.  Cardiovascular:     Rate and Rhythm: Normal rate and regular rhythm.  Pulmonary:     Effort: Pulmonary effort is normal. No respiratory distress.     Breath sounds: Normal breath sounds. No wheezing or rales.  Musculoskeletal:     Comments: Trace edema lower legs bilaterally  Neurological:     Mental Status: He is alert and oriented to person, place, and time.        Assessment:     #1 hypothyroidism.  Recent TSH at goal  #2 hypertension stable on lisinopril  #3 past history of lumbar stenosis  #4 hyperlipidemia treated with atorvastatin.  Recent lipids at goal  #5 past history of diverticulitis  #6 irritable bowel syndrome-stable on dicyclomine twice daily    Plan:     -  Patient already had flu vaccine.  It appears his immunizations are up-to-date. -We suggested that he consider scheduling Medicare wellness visit -We will plan on follow-up in 6 months for routine medical follow-up and will be due for labs around that time.  Sooner as needed  Eulas Post MD [facility]

## 2018-03-18 ENCOUNTER — Other Ambulatory Visit: Payer: Self-pay

## 2018-03-18 MED ORDER — ATORVASTATIN CALCIUM 20 MG PO TABS: 10.0000 mg | ORAL_TABLET | Freq: Every day | ORAL | 1 refills | Status: DC

## 2018-03-18 MED ORDER — TAMSULOSIN HCL 0.4 MG PO CAPS: 0.4000 mg | ORAL_CAPSULE | Freq: Every day | ORAL | 3 refills | Status: DC

## 2018-03-23 NOTE — Progress Notes (Addendum)
Subjective:   Timothy Garrett is a 85 y.o. male who presents for an Initial Medicare Annual Wellness Visit.  Review of Systems  No ROS.  Medicare Wellness Visit. Additional risk factors are reflected in the social history.  Cardiac Risk Factors include: advanced age (>27men, >46 women);dyslipidemia;male gender;sedentary lifestyle;family history of premature cardiovascular disease Sleep patterns: feels rested on waking, gets up 2 times nightly to void and sleeps 8 hours nightly.    Home Safety/Smoke Alarms: Feels safe in home. Smoke alarms in place.  Living environment; residence and Firearm Safety: 1-story house/ trailer. No use or need for DME at this time. Recently down-sized.  Seat Belt Safety/Bike Helmet: Wears seat belt.  Male:   CCS- 06/2005; abnormal. Due for repeat technically but pt. Over 80yo. Pt. declines repeat exam. States has not had any diverticulitis flare ups recently.  PSA-  Lab Results  Component Value Date   PSA 1.03 08/19/2008   PSA 0.90 07/18/2006      Objective:    Today's Vitals   03/24/18 0938  BP: 120/66  Pulse: 73  Resp: 14  Temp: 97.8 F (36.6 C)  TempSrc: Oral  SpO2: 95%  Weight: 158 lb (71.7 kg)  Height: 5\' 5"  (1.651 m)  PainSc: 2   PainLoc: Shoulder   Body mass index is 26.29 kg/m.  Advanced Directives 03/24/2018 04/28/2017  Does Patient Have a Medical Advance Directive? Yes No  Type of Paramedic of [place];Living will -  Does patient want to make changes to medical advance directive? No - Patient declined -  Copy of [place] in Chart? No - copy requested -  Would patient like information on creating a medical advance directive? - No - Patient declined    Current Medications (verified) Outpatient Encounter Medications as of 03/24/2018  Medication Sig  . acetaminophen (TYLENOL) 500 MG tablet Take 1,000 mg by mouth every 6 (six) hours as needed (for pain).  Marland Kitchen aspirin 81 MG tablet  Take 81 mg by mouth at bedtime.   Marland Kitchen atorvastatin (LIPITOR) 20 MG tablet Take 0.5 tablets (10 mg total) by mouth at bedtime.  . Cholecalciferol (VITAMIN D) 50 MCG (2000 UT) CAPS Take 1 capsule by mouth daily.  Marland Kitchen dicyclomine (BENTYL) 20 MG tablet TAKE 1 TABLET TWICE A DAY AS NEEDED (Patient taking differently: Take 20 mg by mouth two times a day- morning and night)  . gabapentin (NEURONTIN) 100 MG capsule Take 200-300 mg by mouth See admin instructions. Take 200 mg by mouth in the morning and 300 mg at bedtime  . levothyroxine (SYNTHROID, LEVOTHROID) 50 MCG tablet Take 1 tablet (50 mcg total) by mouth daily.  Marland Kitchen lisinopril (PRINIVIL,ZESTRIL) 20 MG tablet Take 1 tablet (20 mg total) by mouth daily. (Patient taking differently: Take 10 mg by mouth at bedtime. )  . LORazepam (ATIVAN) 1 MG tablet Take 1 tablet (1 mg total) by mouth daily as needed for anxiety. (Patient taking differently: Take 0.5-1 mg by mouth at bedtime. )  . Multiple Vitamins-Minerals (CENTRUM SILVER 50+MEN) TABS Take 1 tablet by mouth daily with breakfast.  . pantoprazole (PROTONIX) 40 MG tablet TAKE 1 TABLET DAILY (Patient taking differently: Take 40 mg by mouth once a day)  . ranitidine (ZANTAC) 150 MG tablet Take 150 mg by mouth 2 (two) times daily.  . tamsulosin (FLOMAX) 0.4 MG CAPS capsule Take 1 capsule (0.4 mg total) by mouth at bedtime.   No facility-administered encounter medications on file as of 03/24/2018.  Allergies (verified) Sulfamethoxazole-trimethoprim   History: Past Medical History:  Diagnosis Date  . Arthritis   . Diverticulitis   . Diverticulitis   . GERD (gastroesophageal reflux disease)    Heartburn  . Hyperlipemia   . Hypertension   . IBS (irritable bowel syndrome)   . Kidney stones    Past Surgical History:  Procedure Laterality Date  . BACK SURGERY    . EYE SURGERY  2016   cataract surgery  . TONSILLECTOMY  1941  . ulcer surgery     Family History  Problem Relation Age of Onset  .  Stomach cancer Mother   . Hypertension Mother   . Heart disease Father   . Heart attack Father   . Heart attack Sister   . Stroke Maternal Grandfather   . Heart attack Paternal Grandfather    Social History   Socioeconomic History  . Marital status: Married    Spouse name: Not on file  . Number of children: 2  . Years of education: Not on file  . Highest education level: Not on file  Occupational History  . 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 Financial resource strain: Not hard at all  . Food insecurity:    Worry: Never true    Inability: Never true  . Transportation needs:    Medical: No    Non-medical: No  Tobacco Use  . Smoking status: Former Research scientist (life sciences)  . Smokeless tobacco: Never Used  . Tobacco comment: Quit 60 years ago  Substance and Sexual Activity  . Alcohol use: No  . Drug use: No  . Sexual activity: Not Currently  Lifestyle  . Physical activity:    Days per week: 0 days    Minutes per session: 0 min  . Stress: Not at all  Relationships  . Social connections:    Talks on phone: More than three times a week    Gets together: Twice a week    Attends religious service: Not on file    Active member of club or organization: No    Attends meetings of clubs or organizations: Never    Relationship status: Married  Other Topics Concern  . Not on file  Social History Narrative   Lives with wife on one level condo, recently downsized   Has two children, local, 4 grandchildren   Has one dog, Sam, enjoys walking it, but no other regular exercise   Enjoys doing Haematologist, wood-working   Tobacco Counseling Counseling given: Not Answered Comment: Quit 60 years ago   Activities of Daily Living In your present state of health, do you have any difficulty performing the following activities: 03/24/2018 02/24/2018  Hearing? 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 N N  Difficulty concentrating or making decisions? N N  Walking or  climbing stairs? N N  Dressing or bathing? N N  Doing errands, shopping? N N  Preparing Food and eating ? N -  Using the Toilet? N -  In the past six months, have you accidently leaked urine? N -  Do you have problems with loss of bowel control? N -  Managing your Medications? N -  Managing your Finances? N -  Housekeeping or managing your Housekeeping? N -  Some recent data might be hidden     Immunizations and Health Maintenance Immunization History  Administered Date(s) Administered  . Influenza Split 11/16/2010  . Influenza Whole 11/13/2006, 11/17/2006, 11/13/2007, 11/23/2009  .  Influenza, Seasonal, Injecte, Preservative Fre 11/25/2014  . Pneumococcal Conjugate-13 07/06/2014  . Pneumococcal Polysaccharide-23 02/21/1999  . Pneumococcal-Unspecified 02/21/1999  . Td 02/11/1997, 08/19/2008  . Tdap 08/11/2008   There are no preventive care reminders to display for this patient.  Patient Care Team: Eulas Post, MD as PCP - General (Family Medicine) Loletha Carrow Kirke Corin, MD as Consulting Physician (Gastroenterology) Calvert Cantor, MD as Consulting Physician (Ophthalmology)  Indicate any recent Medical Services you may have received from other than [facility] providers in the past year (date may be approximate).    Assessment:   This is a routine wellness examination for [employer]. Physical assessment deferred to PCP.   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 Difficulty with hearing in L ear. Did not pass whisper test, no hearing aide present during visit.   Wears hearing aide occasionally, which does help, but does not wear it all the time d/t issues with background noise. Audiology providers given to pt.  Vision Screening Comments: No acute vision issues; sees eye doctor routinely.  Dietary issues and exercise activities discussed: Current Exercise Habits: The patient does not participate in regular exercise at present, Exercise limited by:  orthopedic condition(s);cardiac condition(s) Diet (meal preparation, eat out, water intake, caffeinated beverages, dairy products, fruits and vegetables): in general, an "unhealthy" diet, on average, 3 meals per day, high salt. Pt. admitted to not drinking enough water, education provided. DASH diet discussed d/t pt. and familial cardiac hx, written hand-out provided.     Goals    . Patient Stated     Maintain independence!       Depression Screen PHQ 2/9 Scores 03/24/2018 02/24/2018  PHQ - 2 Score 0 0  PHQ- 9 Score 0 -    Fall Risk Fall Risk  03/24/2018 02/24/2018  Falls in the past year? 1 0  Injury with Fall? 1 -  Comment sprained shoulder, but ok now -  Risk for fall due to : History of fall(s) -  Risk for fall due to: Comment tripped by dog -  Follow up Falls prevention discussed;Education provided -     Cognitive Function: MMSE - Mini Mental State Exam 03/24/2018  Orientation to time 5  Orientation to Place 5  Registration 3  Attention/ Calculation 4  Recall 3  Language- name 2 objects 1  Language- repeat 1  Language- follow 3 step command 3  Language- read & follow direction 1  Write a sentence 1  Copy design 1  Total score 28   Pt. Denies any concerns with memory, but had some recall difficulty, particularly with calculation.      Ad8 score reviewed for issues:  Issues making decisions: no  Less interest in hobbies / activities: no  Repeats questions, stories (family complaining): no  Trouble using ordinary gadgets (microwave, computer, phone):no  Forgets the month or year: no  Mismanaging finances: no  Remembering appts: no  Daily problems with thinking and/or memory: no Ad8 score is= 0  Screening Tests Health Maintenance  Topic Date Due  . TETANUS/TDAP  08/20/2018  . INFLUENZA VACCINE  Completed  . PNA vac Low Risk Adult  Completed      Plan:    Bring a copy of your living will and/or healthcare power of attorney to your next office  visit.  Continue doing brain stimulating activities (puzzles, reading, adult coloring books, staying active) to keep memory sharp.   Consider walking more, keeping fall precautions in mind so as to  avoid injury/fracture. Refer to shoulder exercise information provided given R shoulder pain s/p dog pulling at leash. Schedule a visit with Dr. Elease Hashimoto if pain not improved.  DASH diet information provided.   I have personally reviewed and noted the following in the patient's chart:   . Medical and social history . Use of alcohol, tobacco or illicit drugs  . Current medications and supplements . Functional ability and status . Nutritional status . Physical activity . Advanced directives . List of other physicians . Hospitalizations, surgeries, and ER visits in previous 12 months . Vitals . Screenings to include cognitive, depression, and falls . Referrals and appointments  In addition, I have reviewed and discussed with patient certain preventive protocols, quality metrics, and best practice recommendations. A written personalized care plan for preventive services as well as general preventive health recommendations were provided to patient.     Alphia Moh, RN   03/24/2018   I have reviewed the documentation for the AWV and [place] provided by the health coach and agree with their documentation. I was immediately available for any questions  Eulas Post MD [facility]

## 2018-03-24 ENCOUNTER — Ambulatory Visit (INDEPENDENT_AMBULATORY_CARE_PROVIDER_SITE_OTHER): Payer: Medicare Other

## 2018-03-24 VITALS — BP 120/66 | HR 73 | Temp 97.8°F | Resp 14 | Ht 65.0 in | Wt 158.0 lb

## 2018-03-24 DIAGNOSIS — Z0000 Encounter for general adult medical examination without abnormal findings: Secondary | ICD-10-CM | POA: Diagnosis not present

## 2018-03-24 NOTE — Patient Instructions (Addendum)
Bring a copy of your living will and/or healthcare power of attorney to your next office visit.  Continue doing brain stimulating activities (puzzles, reading, adult coloring books, staying active) to keep memory sharp.   Consider walking more, keeping fall precautions in mind so as to avoid injury/fracture.   Timothy Garrett , Thank you for taking time to come for your Medicare Wellness Visit. I appreciate your ongoing commitment to your health goals. Please review the following plan we discussed and let me know if I can assist you in the future.     Fall Prevention in the Home, Adult Falls can cause injuries. They can happen to people of all ages. There are many things you can do to make your home safe and to help prevent falls. Ask for help when making these changes, if needed. What actions can I take to prevent falls? General Instructions  Use good lighting in all rooms. Replace any light bulbs that burn out.  Turn on the lights when you go into a dark area. Use night-lights.  Keep items that you use often in easy-to-reach places. Lower the shelves around your home if necessary.  Set up your furniture so you have a clear path. Avoid moving your furniture around.  Do not have throw rugs and other things on the floor that can make you trip.  Avoid walking on wet floors.  If any of your floors are uneven, fix them.  Add color or contrast paint or tape to clearly mark and help you see: ? Any grab bars or handrails. ? First and last steps of stairways. ? Where the edge of each step is.  If you use a stepladder: ? Make sure that it is fully opened. Do not climb a closed stepladder. ? Make sure that both sides of the stepladder are locked into place. ? Ask someone to hold the stepladder for you while you use it.  If there are any pets around you, be aware of where they are. What can I do in the bathroom?      Keep the floor dry. Clean up any water that spills onto the floor as  soon as it happens.  Remove soap buildup in the tub or shower regularly.  Use non-skid mats or decals on the floor of the tub or shower.  Attach bath mats securely with double-sided, non-slip rug tape.  If you need to sit down in the shower, use a plastic, non-slip stool.  Install grab bars by the toilet and in the tub and shower. Do not use towel bars as grab bars. What can I do in the bedroom?  Make sure that you have a light by your bed that is easy to reach.  Do not use any sheets or blankets that are too big for your bed. They should not hang down onto the floor.  Have a firm chair that has side arms. You can use this for support while you get dressed. What can I do in the kitchen?  Clean up any spills right away.  If you need to reach something above you, use a strong step stool that has a grab bar.  Keep electrical cords out of the way.  Do not use floor polish or wax that makes floors slippery. If you must use wax, use non-skid floor wax. What can I do with my stairs?  Do not leave any items on the stairs.  Make sure that you have a light switch at the top of  the stairs and the bottom of the stairs. If you do not have them, ask someone to add them for you.  Make sure that there are handrails on both sides of the stairs, and use them. Fix handrails that are broken or loose. Make sure that handrails are as long as the stairways.  Install non-slip stair treads on all stairs in your home.  Avoid having throw rugs at the top or bottom of the stairs. If you do have throw rugs, attach them to the floor with carpet tape.  Choose a carpet that does not hide the edge of the steps on the stairway.  Check any carpeting to make sure that it is firmly attached to the stairs. Fix any carpet that is loose or worn. What can I do on the outside of my home?  Use bright outdoor lighting.  Regularly fix the edges of walkways and driveways and fix any cracks.  Remove anything that  might make you trip as you walk through a door, such as a raised step or threshold.  Trim any bushes or trees on the path to your home.  Regularly check to see if handrails are loose or broken. Make sure that both sides of any steps have handrails.  Install guardrails along the edges of any raised decks and porches.  Clear walking paths of anything that might make someone trip, such as tools or rocks.  Have any leaves, snow, or ice cleared regularly.  Use sand or salt on walking paths during winter.  Clean up any spills in your garage right away. This includes grease or oil spills. What other actions can I take?  Wear shoes that: ? Have a low heel. Do not wear high heels. ? Have rubber bottoms. ? Are comfortable and fit you well. ? Are closed at the toe. Do not wear open-toe sandals.  Use tools that help you move around (mobility aids) if they are needed. These include: ? Canes. ? Walkers. ? Scooters. ? Crutches.  Review your medicines with your doctor. Some medicines can make you feel dizzy. This can increase your chance of falling. Ask your doctor what other things you can do to help prevent falls. Where to find more information  Centers for Disease Control and Prevention, STEADI: https://garcia.biz/  [employer] on Aging: BrainJudge.co.uk Contact a doctor if:  You are afraid of falling at home.  You feel weak, drowsy, or dizzy at home.  You fall at home. Summary  There are many simple things that you can do to make your home safe and to help prevent falls.  Ways to make your home safe include removing tripping hazards and installing grab bars in the bathroom.  Ask for help when making these changes in your home. This information is not intended to replace advice given to you by your health care provider. Make sure you discuss any questions you have with your health care provider. Document Released: 11/24/2008 Document Revised: 09/12/2016 Document  Reviewed: 09/12/2016 Elsevier Interactive Patient Education  2019 [place] Maintenance, Male A healthy lifestyle and preventive care is important for your health and wellness. Ask your health care provider about what schedule of regular examinations is right for you. What should I know about weight and diet? Eat a Healthy Diet  Eat plenty of vegetables, fruits, whole grains, low-fat dairy products, and lean protein.  Do not eat a lot of foods high in solid fats, added sugars, or salt.  Maintain a Healthy Weight  Regular exercise can help you achieve or maintain a healthy weight. You should:  Do at least 150 minutes of exercise each week. The exercise should increase your heart rate and make you sweat (moderate-intensity exercise).  Do strength-training exercises at least twice a week. Watch Your Levels of Cholesterol and Blood Lipids  Have your blood tested for lipids and cholesterol every 5 years starting at 83 years of age. If you are at high risk for heart disease, you should start having your blood tested when you are 83 years old. You may need to have your cholesterol levels checked more often if: ? Your lipid or cholesterol levels are high. ? You are older than 83 years of age. ? You are at high risk for heart disease. What should I know about cancer screening? Many types of cancers can be detected early and may often be prevented. Lung Cancer  You should be screened every year for lung cancer if: ? You are a current smoker who has smoked for at least 30 years. ? You are a former smoker who has quit within the past 15 years.  Talk to your health care provider about your screening options, when you should start screening, and how often you should be screened. Colorectal Cancer  Routine colorectal cancer screening usually begins at 83 years of age and should be repeated every 5-10 years until you are 83 years old. You may need to be screened more often if early  forms of precancerous polyps or small growths are found. Your health care provider may recommend screening at an earlier age if you have risk factors for colon cancer.  Your health care provider may recommend using home test kits to check for hidden blood in the stool.  A small camera at the end of a tube can be used to examine your colon (sigmoidoscopy or colonoscopy). This checks for the earliest forms of colorectal cancer. Prostate and Testicular Cancer  Depending on your age and overall health, your health care provider may do certain tests to screen for prostate and testicular cancer.  Talk to your health care provider about any symptoms or concerns you have about testicular or prostate cancer. Skin Cancer  Check your skin from head to toe regularly.  Tell your health care provider about any new moles or changes in moles, especially if: ? There is a change in a mole's size, shape, or color. ? You have a mole that is larger than a pencil eraser.  Always use sunscreen. Apply sunscreen liberally and repeat throughout the day.  Protect yourself by wearing long sleeves, pants, a wide-brimmed hat, and sunglasses when outside. What should I know about heart disease, diabetes, and high blood pressure?  If you are 57-72 years of age, have your blood pressure checked every 3-5 years. If you are 40 years of age or older, have your blood pressure checked every year. You should have your blood pressure measured twice-once when you are at a hospital or clinic, and once when you are not at a hospital or clinic. Record the average of the two measurements. To check your blood pressure when you are not at a hospital or clinic, you can use: ? An automated blood pressure machine at a pharmacy. ? A home blood pressure monitor.  Talk to your health care provider about your target blood pressure.  If you are between 35-50 years old, ask your health care provider if you should take aspirin to prevent heart  disease.  Have  regular diabetes screenings by checking your fasting blood sugar level. ? If you are at a normal weight and have a low risk for diabetes, have this test once every three years after the age of 49. ? If you are overweight and have a high risk for diabetes, consider being tested at a younger age or more often.  A one-time screening for abdominal aortic aneurysm (AAA) by ultrasound is recommended for men aged 98-75 years who are current or former smokers. What should I know about preventing infection? Hepatitis B If you have a higher risk for hepatitis B, you should be screened for this virus. Talk with your health care provider to find out if you are at risk for hepatitis B infection. Hepatitis C Blood testing is recommended for:  Everyone born from [birth year] through 1963.  Anyone with known risk factors for hepatitis C. Sexually Transmitted Diseases (STDs)  You should be screened each year for STDs including gonorrhea and chlamydia if: ? You are sexually active and are younger than 83 years of age. ? You are older than 83 years of age and your health care provider tells you that you are at risk for this type of infection. ? Your sexual activity has changed since you were last screened and you are at an increased risk for chlamydia or gonorrhea. Ask your health care provider if you are at risk.  Talk with your health care provider about whether you are at high risk of being infected with HIV. Your health care provider may recommend a prescription medicine to help prevent HIV infection. What else can I do?  Schedule regular health, dental, and eye exams.  Stay current with your vaccines (immunizations).  Do not use any tobacco products, such as cigarettes, chewing tobacco, and e-cigarettes. If you need help quitting, ask your health care provider.  Limit alcohol intake to no more than 2 drinks per day. One drink equals 12 ounces of beer, 5 ounces of wine, or 1 ounces of hard  liquor.  Do not use street drugs.  Do not share needles.  Ask your health care provider for help if you need support or information about quitting drugs.  Tell your health care provider if you often feel depressed.  Tell your health care provider if you have ever been abused or do not feel safe at home. This information is not intended to replace advice given to you by your health care provider. Make sure you discuss any questions you have with your health care provider. Document Released: 07/27/2007 Document Revised: 09/27/2015 Document Reviewed: 11/01/2014 Elsevier Interactive Patient Education  2019 [employer].   Hearing Loss  Hearing loss is a partial or total loss of the ability to hear. This can be temporary or permanent, and it can happen in one or both ears. Hearing loss may be referred to as deafness. Medical care is necessary to treat hearing loss properly and to prevent the condition from getting worse. Your hearing may partially or completely come back, depending on what caused your hearing loss and how severe it is. In some cases, hearing loss is permanent. What are the causes? Common causes of hearing loss include:  Too much wax in the ear canal.  Infection of the ear canal or middle ear.  Fluid in the middle ear.  Injury to the ear or surrounding area.  An object stuck in the ear.  Prolonged exposure to loud sounds, such as music. Less common causes of hearing loss include:  Tumors  in the ear.  Viral or bacterial infections, such as meningitis.  A hole in the eardrum (perforated eardrum).  Problems with the hearing nerve that sends signals between the brain and the ear.  Certain medicines. What are the signs or symptoms? Symptoms of this condition may include:  Difficulty telling the difference between sounds.  Difficulty following a conversation when there is background noise.  Lack of response to sounds in your environment. This may be most  noticeable when you do not respond to startling sounds.  Needing to turn up the volume on the television, radio, etc.  Ringing in the ears.  Dizziness.  Pain in the ears. How is this diagnosed? This condition is diagnosed based on a physical exam and a hearing test (audiometry). The audiometry test will be performed by a hearing specialist (audiologist). You may also be referred to an ear, nose, and throat (ENT) specialist (otolaryngologist). How is this treated? Treatment for recent onset of hearing loss may include:  Ear wax removal.  Being prescribed medicines to prevent infection (antibiotics).  Being prescribed medicines to reduce inflammation (corticosteroids). Follow these instructions at home:  If you were prescribed an antibiotic medicine, take it as told by your health care provider. Do not stop taking the antibiotic even if you start to feel better.  Take over-the-counter and prescription medicines only as told by your health care provider.  Avoid loud noises.  Return to your normal activities as told by your health care provider. Ask your health care provider what activities are safe for you.  Keep all follow-up visits as told by your health care provider. This is important. Contact a health care provider if:  You feel dizzy.  You develop new symptoms.  You vomit or feel nauseous.  You have a fever. Get help right away if:  You develop sudden changes in your vision.  You have severe ear pain.  You have new or increased weakness.  You have a severe headache. This information is not intended to replace advice given to you by your health care provider. Make sure you discuss any questions you have with your health care provider. Document Released: 01/28/2005 Document Revised: 07/06/2015 Document Reviewed: 06/15/2014 Elsevier Interactive Patient Education  2019 [place].  Shoulder Exercises Ask your health care provider which exercises are safe for  you. Do exercises exactly as told by your health care provider and adjust them as directed. It is normal to feel mild stretching, pulling, tightness, or discomfort as you do these exercises, but you should stop right away if you feel sudden pain or your pain gets worse.Do not begin these exercises until told by your health care provider. Range of Motion Exercises        These exercises warm up your muscles and joints and improve the movement and flexibility of your shoulder. These exercises also help to relieve pain, numbness, and tingling. These exercises involve stretching your injured shoulder directly. Exercise A: Pendulum 1. Stand near a wall or a surface that you can hold onto for balance. 2. Bend at the waist and let your left / right arm hang straight down. Use your other arm to support you. Keep your back straight and do not lock your knees. 3. Relax your left / right arm and shoulder muscles, and move your hips and your trunk so your left / right arm swings freely. Your arm should swing because of the motion of your body, not because you are using your arm or shoulder muscles.  4. Keep moving your body so your arm swings in the following directions, as told by your health care provider: ? Side to side. ? Forward and backward. ? In clockwise and counterclockwise circles. 5. Continue each motion for __________ seconds, or for as long as told by your health care provider. 6. Slowly return to the starting position. Repeat __________ times. Complete this exercise __________ times a day. Exercise B:Flexion, Standing 1. Stand and hold a broomstick, a cane, or a similar object. Place your hands a little more than shoulder-width apart on the object. Your left / right hand should be palm-up, and your other hand should be palm-down. 2. Keep your elbow straight and keep your shoulder muscles relaxed. Push the stick down with your healthy arm to raise your left / right arm in front of your body,  and then over your head until you feel a stretch in your shoulder. ? Avoid shrugging your shoulder while you raise your arm. Keep your shoulder blade tucked down toward the middle of your back. 3. Hold for __________ seconds. 4. Slowly return to the starting position. Repeat __________ times. Complete this exercise __________ times a day. Exercise C: Abduction, Standing 1. Stand and hold a broomstick, a cane, or a similar object. Place your hands a little more than shoulder-width apart on the object. Your left / right hand should be palm-up, and your other hand should be palm-down. 2. While keeping your elbow straight and your shoulder muscles relaxed, push the stick across your body toward your left / right side. Raise your left / right arm to the side of your body and then over your head until you feel a stretch in your shoulder. ? Do not raise your arm above shoulder height, unless your health care provider tells you to do that. ? Avoid shrugging your shoulder while you raise your arm. Keep your shoulder blade tucked down toward the middle of your back. 3. Hold for __________ seconds. 4. Slowly return to the starting position. Repeat __________ times. Complete this exercise __________ times a day. Exercise D:Internal Rotation 1. Place your left / right hand behind your back, palm-up. 2. Use your other hand to dangle an exercise band, a towel, or a similar object over your shoulder. Grasp the band with your left / right hand so you are holding onto both ends. 3. Gently pull up on the band until you feel a stretch in the front of your left / right shoulder. ? Avoid shrugging your shoulder while you raise your arm. Keep your shoulder blade tucked down toward the middle of your back. 4. Hold for __________ seconds. 5. Release the stretch by letting go of the band and lowering your hands. Repeat __________ times. Complete this exercise __________ times a day. Stretching Exercises  These  exercises warm up your muscles and joints and improve the movement and flexibility of your shoulder. These exercises also help to relieve pain, numbness, and tingling. These exercises are done using your healthy shoulder to help stretch the muscles of your injured shoulder. Exercise E: Warehouse manager (External Rotation and Abduction) 1. Stand in a doorway with one of your feet slightly in front of the other. This is called a staggered stance. If you cannot reach your forearms to the door frame, stand facing a corner of a room. 2. Choose one of the following positions as told by your health care provider: ? Place your hands and forearms on the door frame above your head. ? Place your  hands and forearms on the door frame at the height of your head. ? Place your hands on the door frame at the height of your elbows. 3. Slowly move your weight onto your front foot until you feel a stretch across your chest and in the front of your shoulders. Keep your head and chest upright and keep your abdominal muscles tight. 4. Hold for __________ seconds. 5. To release the stretch, shift your weight to your back foot. Repeat __________ times. Complete this stretch __________ times a day. Exercise F:Extension, Standing 1. Stand and hold a broomstick, a cane, or a similar object behind your back. ? Your hands should be a little wider than shoulder-width apart. ? Your palms should face away from your back. 2. Keeping your elbows straight and keeping your shoulder muscles relaxed, move the stick away from your body until you feel a stretch in your shoulder. ? Avoid shrugging your shoulders while you move the stick. Keep your shoulder blade tucked down toward the middle of your back. 3. Hold for __________ seconds. 4. Slowly return to the starting position. Repeat __________ times. Complete this exercise __________ times a day. Strengthening Exercises           These exercises build strength and endurance in  your shoulder. Endurance is the ability to use your muscles for a long time, even after they get tired. Exercise G:External Rotation 1. Sit in a stable chair without armrests. 2. Secure an exercise band at elbow height on your left / right side. 3. Place a soft object, such as a folded towel or a small pillow, between your left / right upper arm and your body to move your elbow a few inches away (about 10 cm) from your side. 4. Hold the end of the band so it is tight and there is no slack. 5. Keeping your elbow pressed against the soft object, move your left / right forearm out, away from your abdomen. Keep your body steady so only your forearm moves. 6. Hold for __________ seconds. 7. Slowly return to the starting position. Repeat __________ times. Complete this exercise __________ times a day. Exercise H:Shoulder Abduction 1. Sit in a stable chair without armrests, or stand. 2. Hold a __________ weight in your left / right hand, or hold an exercise band with both hands. 3. Start with your arms straight down and your left / right palm facing in, toward your body. 4. Slowly lift your left / right hand out to your side. Do not lift your hand above shoulder height unless your health care provider tells you that this is safe. ? Keep your arms straight. ? Avoid shrugging your shoulder while you do this movement. Keep your shoulder blade tucked down toward the middle of your back. 5. Hold for __________ seconds. 6. Slowly lower your arm, and return to the starting position. Repeat __________ times. Complete this exercise __________ times a day. Exercise I:Shoulder Extension 1. Sit in a stable chair without armrests, or stand. 2. Secure an exercise band to a stable object in front of you where it is at shoulder height. 3. Hold one end of the exercise band in each hand. Your palms should face each other. 4. Straighten your elbows and lift your hands up to shoulder height. 5. Step back, away from  the secured end of the exercise band, until the band is tight and there is no slack. 6. Squeeze your shoulder blades together as you pull your hands down to the sides  of your thighs. Stop when your hands are straight down by your sides. Do not let your hands go behind your body. 7. Hold for __________ seconds. 8. Slowly return to the starting position. Repeat __________ times. Complete this exercise __________ times a day. Exercise J:Standing Shoulder Row 1. Sit in a stable chair without armrests, or stand. 2. Secure an exercise band to a stable object in front of you so it is at waist height. 3. Hold one end of the exercise band in each hand. Your palms should be in a thumbs-up position. 4. Bend each of your elbows to an "L" shape (about 90 degrees) and keep your upper arms at your sides. 5. Step back until the band is tight and there is no slack. 6. Slowly pull your elbows back behind you. 7. Hold for __________ seconds. 8. Slowly return to the starting position. Repeat __________ times. Complete this exercise __________ times a day. Exercise K:Shoulder Press-Ups 1. Sit in a stable chair that has armrests. Sit upright, with your feet flat on the floor. 2. Put your hands on the armrests so your elbows are bent and your fingers are pointing forward. Your hands should be about even with the sides of your body. 3. Push down on the armrests and use your arms to lift yourself off of the chair. Straighten your elbows and lift yourself up as much as you comfortably can. ? Move your shoulder blades down, and avoid letting your shoulders move up toward your ears. ? Keep your feet on the ground. As you get stronger, your feet should support less of your body weight as you lift yourself up. 4. Hold for __________ seconds. 5. Slowly lower yourself back into the chair. Repeat __________ times. Complete this exercise __________ times a day. Exercise L: Wall Push-Ups 1. Stand so you are facing a stable  wall. Your feet should be about one arm-length away from the wall. 2. Lean forward and place your palms on the wall at shoulder height. 3. Keep your feet flat on the floor as you bend your elbows and lean forward toward the wall. 4. Hold for __________ seconds. 5. Straighten your elbows to push yourself back to the starting position. Repeat __________ times. Complete this exercise __________ times a day. This information is not intended to replace advice given to you by your health care provider. Make sure you discuss any questions you have with your health care provider. Document Released: 12/12/2004 Document Revised: 06/03/2017 Document Reviewed: 10/09/2014 Elsevier Interactive Patient Education  2019 [employer].

## 2018-03-31 ENCOUNTER — Encounter: Payer: Self-pay | Admitting: Family Medicine

## 2018-03-31 ENCOUNTER — Other Ambulatory Visit: Payer: Self-pay

## 2018-03-31 ENCOUNTER — Ambulatory Visit: Payer: Medicare Other | Admitting: Family Medicine

## 2018-03-31 VITALS — BP 118/74 | HR 80 | Temp 97.7°F | Ht 65.25 in | Wt 160.1 lb

## 2018-03-31 DIAGNOSIS — M25512 Pain in left shoulder: Secondary | ICD-10-CM

## 2018-03-31 MED ORDER — METHYLPREDNISOLONE ACETATE 40 MG/ML IJ SUSP
40.0000 mg | Freq: Once | INTRAMUSCULAR | Status: AC
  Administered 2018-03-31: 40 mg via INTRA_ARTICULAR

## 2018-03-31 NOTE — Addendum Note (Signed)
Addended by: Anibal Henderson on: 03/31/2018 12:23 PM   Modules accepted: Orders

## 2018-03-31 NOTE — Patient Instructions (Signed)

## 2018-03-31 NOTE — Progress Notes (Signed)
  Subjective:     Patient ID: Timothy Garrett, male   DOB: 18-Oct-1931, 85 y.o.   MRN: [id number]  HPI Patient is seen with left shoulder pain.  He states this started around January 3.  He was opening a wooden gate which was fairly heavy and the gait went to swing open and he tried to grab the gate and felt a sudden pull and pain in his left shoulder.  He has had some pain since then.  He is sleeping okay.  No night pain.  Tylenol helps.  No neck pain.  No definite weakness.  Pain with abduction and external rotation.  No prior history of rotator cuff injury.  Pain is moderate with certain movements  Past Medical History:  Diagnosis Date  . Arthritis   . Diverticulitis   . Diverticulitis   . GERD (gastroesophageal reflux disease)    Heartburn  . Hyperlipemia   . Hypertension   . IBS (irritable bowel syndrome)   . Kidney stones    Past Surgical History:  Procedure Laterality Date  . BACK SURGERY    . EYE SURGERY  2016   cataract surgery  . TONSILLECTOMY  1941  . ulcer surgery      reports that he has quit smoking. He has never used smokeless tobacco. He reports that he does not drink alcohol or use drugs. family history includes Heart attack in his father, paternal grandfather, and sister; Heart disease in his father; Hypertension in his mother; Stomach cancer in his mother; Stroke in his maternal grandfather. Allergies  Allergen Reactions  . Sulfamethoxazole-Trimethoprim Itching     Review of Systems  Cardiovascular: Negative for chest pain.  Musculoskeletal: Negative for neck pain.  Neurological: Negative for weakness and numbness.       Objective:   Physical Exam Constitutional:      Appearance: Normal appearance.  Neck:     Musculoskeletal: Neck supple.  Musculoskeletal:     Comments: Patient has some mild sternoclavicular joint tenderness and mild acromioclavicular joint tenderness.  Otherwise no localized tenderness around the shoulder region.  He has slight  prominence of the left sternoclavicular area compared to the right.  He has fairly good range of motion left shoulder with passive and active range of motion.  He has some pain with abduction against resistance.  Question of mild left rotator cuff weakness versus right versus limitation secondary to pain  Neurological:     Mental Status: He is alert.        Assessment:     Left shoulder pain.  Onset about 6 weeks ago as above.  Concern is whether he could have partial or full thickness rotator cuff tear.  Hopefully this represents mostly impingement syndrome    Plan:      -Discussed risks and benefits of corticosteroid injection and patient consented.  After prepping skin with betadine, injected 40 mg depomedrol and 2 cc of plain xylocaine with 25 gauge one and one half inch needle using posterior lateral approach and pt tolerated well. -Gentle range of motion as tolerated.  Will bring back to reassess in 2 weeks.  If not improved at that point consider sports medicine referral  Eulas Post MD [facility] at [facility]

## 2018-04-14 ENCOUNTER — Other Ambulatory Visit: Payer: Self-pay

## 2018-04-14 ENCOUNTER — Ambulatory Visit: Payer: Medicare Other | Admitting: Family Medicine

## 2018-04-14 ENCOUNTER — Encounter: Payer: Self-pay | Admitting: Family Medicine

## 2018-04-14 VITALS — BP 104/68 | HR 70 | Temp 97.8°F | Ht 65.25 in | Wt 156.1 lb

## 2018-04-14 DIAGNOSIS — M7582 Other shoulder lesions, left shoulder: Secondary | ICD-10-CM | POA: Diagnosis not present

## 2018-04-14 NOTE — Patient Instructions (Signed)
Rotator Cuff Tendinitis  Rotator cuff tendinitis is inflammation of the tough, cord-like bands that connect muscle to bone (tendons) in the rotator cuff. The rotator cuff includes all of the muscles and tendons that connect the arm to the shoulder. The rotator cuff holds the head of the upper arm bone (humerus) in the cup (fossa) of the shoulder blade (scapula). This condition can lead to a long-lasting (chronic) tear. The tear may be partial or complete. What are the causes? This condition is usually caused by overusing the rotator cuff. What increases the risk? This condition is more likely to develop in athletes and workers who frequently use their shoulder or reach over their heads. This can include activities such as:  Tennis.  Baseball or softball.  Swimming.  Construction work.  Painting. What are the signs or symptoms? Symptoms of this condition include:  Pain spreading (radiating) from the shoulder to the upper arm.  Swelling and tenderness in front of the shoulder.  Pain when reaching, pulling, or lifting the arm above the head.  Pain when lowering the arm from above the head.  Minor pain in the shoulder when resting.  Increased pain in the shoulder at night.  Difficulty placing the arm behind the back. How is this diagnosed? This condition is diagnosed with a medical history and physical exam. Tests may also be done, including:  X-rays.  MRI.  Ultrasounds.  CT or MR arthrogram. During this test, a contrast material is injected and then images are taken. How is this treated? Treatment for this condition depends on the severity of the condition. In less severe cases, treatment may include:  Rest. This may be done with a sling that holds the shoulder still (immobilization). Your health care provider may also recommend avoiding activities that involve lifting your arm over your head.  Icing the shoulder.  Anti-inflammatory medicines, such as aspirin or  ibuprofen. In more severe cases, treatment may include:  Physical therapy.  Steroid injections.  Surgery. Follow these instructions at home: If you have a sling:  Wear the sling as told by your health care provider. Remove it only as told by your health care provider.  Loosen the sling if your fingers tingle, become numb, or turn cold and blue.  Keep the sling clean.  If the sling is not waterproof, do not let it get wet. Remove it, if allowed, or cover it with a watertight covering when you take a bath or shower. Managing pain, stiffness, and swelling  If directed, put ice on the injured area. ? If you have a removable sling, remove it as told by your health care provider. ? Put ice in a plastic bag. ? Place a towel between your skin and the bag. ? Leave the ice on for 20 minutes, 2-3 times a day.  Move your fingers often to avoid stiffness and to lessen swelling.  Raise (elevate) the injured area above the level of your heart while you are lying down.  Find a comfortable sleeping position or sleep on a recliner, if available. Driving  Do not drive or use heavy machinery while taking prescription pain medicine.  Ask your health care provider when it is safe to drive if you have a sling on your arm. Activity  Rest your shoulder as told by your health care provider.  Return to your normal activities as told by your health care provider. Ask your health care provider what activities are safe for you.  Do any exercises or stretches as   told by your health care provider.  If you do repetitive overhead tasks, take small breaks in between and include stretching exercises as told by your health care provider. General instructions  Do not use any products that contain nicotine or tobacco, such as cigarettes and e-cigarettes. These can delay healing. If you need help quitting, ask your health care provider.  Take over-the-counter and prescription medicines only as told by your  health care provider.  Keep all follow-up visits as told by your health care provider. This is important. Contact a health care provider if:  Your pain gets worse.  You have new pain in your arm, hands, or fingers.  Your pain is not relieved with medicine or does not get better after 6 weeks of treatment.  You have cracking sensations when moving your shoulder in certain directions.  You hear a snapping sound after using your shoulder, followed by severe pain and weakness. Get help right away if:  Your arm, hand, or fingers are numb or tingling.  Your arm, hand, or fingers are swollen or painful or they turn white or blue. Summary  Rotator cuff tendinitis is inflammation of the tough, cord-like bands that connect muscle to bone (tendons) in the rotator cuff.  This condition is usually caused by overusing the rotator cuff, which includes all of the muscles and tendons that connect the arm to the shoulder.  This condition is more likely to develop in athletes and workers who frequently use their shoulder or reach over their heads.  Treatment generally includes rest, anti-inflammatory medicines, and icing. In some cases, physical therapy and steroid injections may be needed. In severe cases, surgery may be needed. This information is not intended to replace advice given to you by your health care provider. Make sure you discuss any questions you have with your health care provider. Document Released: 04/20/2003 Document Revised: 01/15/2016 Document Reviewed: 01/15/2016 Elsevier Interactive Patient Education  2019 [employer] Inc[employer]  

## 2018-04-14 NOTE — Progress Notes (Signed)
  Subjective:     Patient ID: Timothy Garrett, male   DOB: 1931-09-23, 85 y.o.   MRN: [id number]  HPI Patient seen for follow-up regarding left shoulder pain.  We performed steroid injection last visit and he is very pleased with results.  He states is about 75% better.  No night pain.  Improved range of motion.  He has been doing some stretching exercises which he thinks is helped as well.  No neck pain.  No weakness.  Past Medical History:  Diagnosis Date  . Arthritis   . Diverticulitis   . Diverticulitis   . GERD (gastroesophageal reflux disease)    Heartburn  . Hyperlipemia   . Hypertension   . IBS (irritable bowel syndrome)   . Kidney stones    Past Surgical History:  Procedure Laterality Date  . BACK SURGERY    . EYE SURGERY  2016   cataract surgery  . TONSILLECTOMY  1941  . ulcer surgery      reports that he has quit smoking. He has never used smokeless tobacco. He reports that he does not drink alcohol or use drugs. family history includes Heart attack in his father, paternal grandfather, and sister; Heart disease in his father; Hypertension in his mother; Stomach cancer in his mother; Stroke in his maternal grandfather. Allergies  Allergen Reactions  . Sulfamethoxazole-Trimethoprim Itching     Review of Systems  Musculoskeletal: Negative for neck pain.  Neurological: Negative for weakness and numbness.       Objective:   Physical Exam Constitutional:      Appearance: Normal appearance.  Cardiovascular:     Rate and Rhythm: Normal rate and regular rhythm.  Musculoskeletal:     Comments: Full range of motion right shoulder.  Localized tenderness.  Neurological:     Mental Status: He is alert.     Comments: He seems to have symmetric strength comparing left rotator cuff compared with right        Assessment:     Impingement syndrome left shoulder improved following steroid injection    Plan:     -Continue range of motion as instructed -Follow-up  for any recurrent pain or other concerns  Eulas Post MD [facility] at [facility]

## 2018-05-05 ENCOUNTER — Other Ambulatory Visit: Payer: Self-pay

## 2018-05-05 ENCOUNTER — Telehealth: Payer: Self-pay | Admitting: Family Medicine

## 2018-05-05 MED ORDER — GABAPENTIN 100 MG PO CAPS: 200.0000 mg | ORAL_CAPSULE | ORAL | 5 refills | Status: DC

## 2018-05-05 MED ORDER — DICYCLOMINE HCL 20 MG PO TABS: ORAL_TABLET | ORAL | 1 refills | Status: DC

## 2018-05-05 NOTE — Telephone Encounter (Signed)
Refill for 6 months.   He just recently transferred care here and that is why different providers

## 2018-05-05 NOTE — Telephone Encounter (Signed)
Copied from [place] [phone] [id number]. Topic: Quick Communication - Rx Refill/Question >> May 05, 2018 10:25 AM Burchel, Abbi R wrote: Medication: gabapentin (NEURONTIN) 100 MG capsule, dicyclomine (BENTYL) 20 MG tablet   Preferred Pharmacy: [facility] [place], [place] AT [address] [phone] (Phone) [phone] (Fax)   Pt was  advised that RX refills may take up to 3 business days. We ask that you follow-up with your pharmacy.

## 2018-05-05 NOTE — Telephone Encounter (Signed)
Medication has been sent to the pharmacy for the patient per Dr. Elease Hashimoto.

## 2018-05-05 NOTE — Telephone Encounter (Signed)
Last OV 02/24/18  I see the mention of Bentyl in OV notes. OK to fill this and fill the Gabapentin? Are the instructions the same? Last filled by different providers.

## 2018-05-05 NOTE — Telephone Encounter (Signed)
Pt wife Opal Sidles stated she spoke with the pharmacy and she was told that a PA was needed for the dicyclomine (BENTYL) 20 MG tablet. Cb# [id number]

## 2018-05-06 NOTE — Telephone Encounter (Signed)
Prior auth for Dicyclomine 20mg  sent to [email or web address] [id number].

## 2018-05-08 NOTE — Telephone Encounter (Signed)
Scott calling from [employer] called and stated that medication has been approved. Please advise Nicki Reaper stated that this is good for 1 year and we can call the provider line for any concerns

## 2018-05-08 NOTE — Telephone Encounter (Signed)
[place] and let them know the PA has been approved and pharmacy stated that the patient has already picked up this medication.

## 2018-08-25 ENCOUNTER — Encounter: Payer: Self-pay | Admitting: Family Medicine

## 2018-08-25 ENCOUNTER — Ambulatory Visit (INDEPENDENT_AMBULATORY_CARE_PROVIDER_SITE_OTHER): Payer: Medicare Other | Admitting: Family Medicine

## 2018-08-25 ENCOUNTER — Other Ambulatory Visit: Payer: Self-pay

## 2018-08-25 VITALS — BP 106/60 | HR 86 | Temp 98.1°F | Ht 65.25 in | Wt 163.3 lb

## 2018-08-25 DIAGNOSIS — N1832 Chronic kidney disease, stage 3b: Secondary | ICD-10-CM | POA: Insufficient documentation

## 2018-08-25 DIAGNOSIS — N183 Chronic kidney disease, stage 3 unspecified: Secondary | ICD-10-CM | POA: Insufficient documentation

## 2018-08-25 DIAGNOSIS — I10 Essential (primary) hypertension: Secondary | ICD-10-CM

## 2018-08-25 DIAGNOSIS — L570 Actinic keratosis: Secondary | ICD-10-CM | POA: Diagnosis not present

## 2018-08-25 DIAGNOSIS — R252 Cramp and spasm: Secondary | ICD-10-CM | POA: Diagnosis not present

## 2018-08-25 DIAGNOSIS — E785 Hyperlipidemia, unspecified: Secondary | ICD-10-CM | POA: Diagnosis not present

## 2018-08-25 DIAGNOSIS — E039 Hypothyroidism, unspecified: Secondary | ICD-10-CM

## 2018-08-25 LAB — BASIC METABOLIC PANEL
BUN: 23 mg/dL (ref 6–23)
CO2: 26 mEq/L (ref 19–32)
Calcium: 9.3 mg/dL (ref 8.4–10.5)
Chloride: 104 mEq/L (ref 96–112)
Creatinine, Ser: 1.35 mg/dL (ref 0.40–1.50)
GFR: 50.27 mL/min — ABNORMAL LOW (ref >60.00)
Glucose, Bld: 157 mg/dL — ABNORMAL HIGH (ref 70–99)
Potassium: 4.4 mEq/L (ref 3.5–5.1)
Sodium: 140 mEq/L (ref 135–145)

## 2018-08-25 LAB — MAGNESIUM: Magnesium: 2.3 mg/dL (ref 1.5–2.5)

## 2018-08-25 NOTE — Patient Instructions (Addendum)
Leg Cramps Leg cramps occur when one or more muscles tighten and you have no control over this tightening (involuntary muscle contraction). Muscle cramps can develop in any muscle, but the most common place is in the calf muscles of the leg. Those cramps can occur during exercise or when you are at rest. Leg cramps are painful, and they may last for a few seconds to a few minutes. Cramps may return several times before they finally stop. Usually, leg cramps are not caused by a serious medical problem. In many cases, the cause is not known. Some common causes include:  Excessive physical effort (overexertion), such as during intense exercise.  Overuse from repetitive motions, or doing the same thing over and over.  Staying in a certain position for a long period of time.  Improper preparation, form, or technique while performing a sport or an activity.  Dehydration.  Injury.  Side effects of certain medicines.  Abnormally low levels of minerals in your blood (electrolytes), especially potassium and calcium. This could result from: ? Pregnancy. ? Taking diuretic medicines. Follow these instructions at home: Eating and drinking  Drink enough fluid to keep your urine pale yellow. Staying hydrated may help prevent cramps.  Eat a healthy diet that includes plenty of nutrients to help your muscles function. A healthy diet includes fruits and vegetables, lean protein, whole grains, and low-fat or nonfat dairy products. Managing pain, stiffness, and swelling      Try massaging, stretching, and relaxing the affected muscle. Do this for several minutes at a time.  If directed, put ice on areas that are sore or painful after a cramp: ? Put ice in a plastic bag. ? Place a towel between your skin and the bag. ? Leave the ice on for 20 minutes, 2-3 times a day.  If directed, apply heat to muscles that are tense or tight. Do this before you exercise, or as often as told by your health care  provider. Use the heat source that your health care provider recommends, such as a moist heat pack or a heating pad. ? Place a towel between your skin and the heat source. ? Leave the heat on for 20-30 minutes. ? Remove the heat if your skin turns bright red. This is especially important if you are unable to feel pain, heat, or cold. You may have a greater risk of getting burned.  Try taking hot showers or baths to help relax tight muscles. General instructions  If you are having frequent leg cramps, avoid intense exercise for several days.  Take over-the-counter and prescription medicines only as told by your health care provider.  Keep all follow-up visits as told by your health care provider. This is important. Contact a health care provider if:  Your leg cramps get more severe or more frequent, or they do not improve over time.  Your foot becomes cold, numb, or blue. Summary  Muscle cramps can develop in any muscle, but the most common place is in the calf muscles of the leg.  Leg cramps are painful, and they may last for a few seconds to a few minutes.  Usually, leg cramps are not caused by a serious medical problem. Often, the cause is not known.  Stay hydrated and take over-the-counter and prescription medicines only as told by your health care provider. This information is not intended to replace advice given to you by your health care provider. Make sure you discuss any questions you have with your health care  provider. Document Released: 03/07/2004 Document Revised: 01/10/2017 Document Reviewed: 11/07/2016 Elsevier Patient Education  [place].  Actinic Keratosis An actinic keratosis is a precancerous growth on the skin. If there is more than one growth, the condition is called actinic keratoses. Actinic keratoses appear most often on areas of skin that get a lot of sun exposure, including the scalp, face, ears, lips, upper back, forearms, and the backs of the  hands. If left untreated, these growths may develop into a skin cancer called squamous cell carcinoma. It is important to have all these growths checked by a health care provider to determine the best treatment approach. What are the causes? Actinic keratoses are caused by getting too much ultraviolet (UV) radiation from the sun or other UV light sources. What increases the risk? You are more likely to develop this condition if you:  Have light-colored skin and blue eyes.  Have blond or red hair.  Spend a lot of time in the sun.  Do not protect your skin from the sun when outdoors.  Are an older person. The risk of developing an actinic keratosis increases with age. What are the signs or symptoms? Actinic keratoses feel like scaly, rough spots of skin. Symptoms of this condition include growths that may:  Be as small as a pinhead or as big as a quarter.  Itch, hurt, or feel sensitive.  Be skin-colored, light tan, dark tan, pink, or a combination of any of these colors. In most cases, the growths become red.  Have a small piece of pink or gray skin (skin tag) growing from them. It may be easier to notice actinic keratoses by feeling them, rather than seeing them. Sometimes, actinic keratoses disappear, but many reappear a few days to a few weeks later. How is this diagnosed? This condition is usually diagnosed with a physical exam.  A tissue sample may be removed from the actinic keratosis and examined under a microscope (biopsy). How is this treated? If needed, this condition may be treated by:  Scraping off the actinic keratosis (curettage).  Freezing the actinic keratosis with liquid nitrogen (cryosurgery). This causes the growth to eventually fall off the skin.  Applying medicated creams or gels to destroy the cells in the growth.  Applying chemicals to the actinic keratosis to make the outer layers of skin peel off (chemical peel).  Using photodynamic therapy. In this  procedure, medicated cream is applied to the actinic keratosis. This cream increases your skin's sensitivity to light. Then, a strong light is aimed at the actinic keratosis to destroy cells in the growth. Follow these instructions at home: Skin care  Apply cool, wet cloths (cool compresses) to the affected areas.  Do not scratch your skin.  Check your skin regularly for any growths, especially growths that: ? Start to itch or bleed. ? Change in size, shape, or color. Caring for the treated area  Keep the treated area clean and dry as told by your health care provider.  Do not apply any medicine, cream, or lotion to the treated area unless your health care provider tells you to do that.  Do not pick at blisters or try to break them open. This can cause infection and scarring.  If you have red or irritated skin after treatment, follow instructions from your health care provider about how to take care of the treated area. Make sure you: ? Wash your hands with soap and water before you change your bandage (dressing). If soap and water  are not available, use hand sanitizer. ? Change your dressing as told by your health care provider.  If you have red or irritated skin after treatment, check your treated area every day for signs of infection. Check for: ? Redness, swelling, or pain. ? Fluid or blood. ? Warmth. ? Pus or a bad smell. General instructions  Take or apply over-the-counter and prescription medicines only as told by your health care provider.  Return to your normal activities as told by your health care provider. Ask your health care provider what activities are safe for you.  Have a skin exam done every year by a health care provider who is a skin specialist (dermatologist).  Keep all follow-up visits as told by your health care provider. This is important. Lifestyle  Do not use any products that contain nicotine or tobacco, such as cigarettes and e-cigarettes. If you  need help quitting, ask your health care provider.  Take steps to protect your skin from the sun. ? Try to avoid the sun between 10:00 a.m. and 4:00 p.m. This is when the UV light is the strongest. ? Use a sunscreen or sunblock with SPF 30 (sun protection factor 30) or greater. ? Apply sunscreen before you are exposed to sunlight and reapply as often as directed by the instructions on the sunscreen container. ? Always wear sunglasses that have UV protection, and always wear a hat and clothing to protect your skin from sunlight. ? When possible, avoid medicines that increase your sensitivity to sunlight. ? Do not use tanning beds or other indoor tanning devices. Contact a health care provider if:  You notice any changes or new growths on your skin.  You have swelling, pain, or more redness around your treated area.  You have fluid or blood coming from your treated area.  Your treated area feels warm to the touch.  You have pus or a bad smell coming from your treated area.  You have a fever.  You have a blister that becomes large and painful. Summary  An actinic keratosis is a precancerous growth on the skin. If there is more than one growth, the condition is called actinic keratoses. In some cases, if left untreated, these growths can develop into skin cancer.  Check your skin regularly for any growths, especially growths that start to itch or bleed, or change in size, shape, or color.  Take steps to protect your skin from the sun.  Contact a health care provider if you notice any changes or new growths on your skin.  Keep all follow-up visits as told by your health care provider. This is important. This information is not intended to replace advice given to you by your health care provider. Make sure you discuss any questions you have with your health care provider. Document Released: 04/26/2008 Document Revised: 06/10/2017 Document Reviewed: 06/10/2017 Elsevier Patient Education   2020 [employer].

## 2018-08-25 NOTE — Progress Notes (Addendum)
Subjective:     Patient ID: Timothy Garrett, male   DOB: [birth year], 85 y.o.   MRN: [id number]  HPI Patient seen for medical follow-up.  He has chronic problems including hypertension, chronic kidney disease, hypothyroidism, BPH  Generally feeling well.  He has had the following acute issues  He has some scaly patches on the top of his head.  These been present for quite some time.  Nonpainful.  Generally wears a cap for protection when out in the sun  He has arthritis especially left thumb CMC and MCP joints.  He had questions regarding topical diclofenac  Long history of frequent muscle cramps in his thighs and calves especially at night.  He thinks is staying well-hydrated.  Does not take any diuretics.  Was placed on gabapentin years ago for this but is not sure this has helped much.  Past Medical History:  Diagnosis Date  . Arthritis   . Diverticulitis   . Diverticulitis   . GERD (gastroesophageal reflux disease)    Heartburn  . Hyperlipemia   . Hypertension   . IBS (irritable bowel syndrome)   . Kidney stones    Past Surgical History:  Procedure Laterality Date  . BACK SURGERY    . EYE SURGERY  2016   cataract surgery  . TONSILLECTOMY  1941  . ulcer surgery      reports that he has quit smoking. He has never used smokeless tobacco. He reports that he does not drink alcohol or use drugs. family history includes Heart attack in his father, paternal grandfather, and sister; Heart disease in his father; Hypertension in his mother; Stomach cancer in his mother; Stroke in his maternal grandfather. Allergies  Allergen Reactions  . Sulfamethoxazole-Trimethoprim Itching     Review of Systems  Constitutional: Negative for fatigue, fever and unexpected weight change.  Eyes: Negative for visual disturbance.  Respiratory: Negative for cough, chest tightness and shortness of breath.   Cardiovascular: Negative for chest pain, palpitations and leg swelling.   Gastrointestinal: Negative for abdominal pain.  Genitourinary: Negative for dysuria.  Musculoskeletal: Positive for arthralgias. Negative for gait problem.  Neurological: Negative for dizziness, syncope, weakness, light-headedness and headaches.       Objective:   Physical Exam Constitutional:      Appearance: He is well-developed.  HENT:     Right Ear: External ear normal.     Left Ear: External ear normal.  Eyes:     Pupils: Pupils are equal, round, and reactive to light.  Neck:     Musculoskeletal: Neck supple.     Thyroid: No thyromegaly.  Cardiovascular:     Rate and Rhythm: Normal rate and regular rhythm.  Pulmonary:     Effort: Pulmonary effort is normal. No respiratory distress.     Breath sounds: Normal breath sounds. No wheezing or rales.  Skin:    Comments: He has couple of scaly hyperkeratotic areas on the occiput of the scalp.  No ulceration.  Neurological:     Mental Status: He is alert and oriented to person, place, and time.        Assessment:     #1 hypertension stable and at goal  #2 hyperlipidemia treated with atorvastatin.  Last labs were last November  #3 hypothyroidism  #4 actinic keratoses of the scalp  #5 osteoarthritis involving the hands  #6 frequent muscle cramps mostly at night    Plan:     -Check basic metabolic panel and magnesium level -Consider trial of over-the-counter  tumeric -Patient will consider topical diclofenac gel for thumb arthritis -Stressed importance of staying well-hydrated -Avoid any oral nonsteroidals -Return in about 4 months and physical at that point and will plan lipids and other labs then -discussed risks and benefits of treatment of actinic keratoses of help with liquid nitrogen including risk of blistering, infection, scarring.  Patient consented.  There were 3 separate hyperkeratotic areas that were treated with liquid nitrogen without difficulty.  Patient tolerated well.  Keep clean with soap and  water  Eulas Post MD [facility] at [facility]

## 2018-08-27 ENCOUNTER — Telehealth: Payer: Self-pay | Admitting: Family Medicine

## 2018-08-27 NOTE — Telephone Encounter (Signed)
Carle[place] w/[employer] [phone] would like for the practice to submit a prior authorization for the patient's dicyclomine (BENTYL) 20 MG tablet medication through Cover My Meds.

## 2018-09-05 ENCOUNTER — Other Ambulatory Visit: Payer: Self-pay | Admitting: Family Medicine

## 2018-09-14 NOTE — Telephone Encounter (Signed)
I called [employer] and spoke to [facility] and she stated that the Prior Authorization for this patient has been approved from 05/06/18 to 05/06/19.

## 2018-10-22 ENCOUNTER — Other Ambulatory Visit: Payer: Self-pay

## 2018-10-22 ENCOUNTER — Ambulatory Visit (INDEPENDENT_AMBULATORY_CARE_PROVIDER_SITE_OTHER): Payer: Medicare Other

## 2018-10-22 DIAGNOSIS — Z23 Encounter for immunization: Secondary | ICD-10-CM

## 2018-10-22 NOTE — Patient Instructions (Signed)
Health Maintenance Due  Topic Date Due  . TETANUS/TDAP  08/20/2018  . INFLUENZA VACCINE  09/12/2018    Depression screen [facility] 2/9 03/24/2018 02/24/2018  Decreased Interest 0 0  Down, Depressed, Hopeless 0 0  PHQ - 2 Score 0 0  Altered sleeping 0 -  Tired, decreased energy 0 -  Change in appetite 0 -  Feeling bad or failure about yourself  0 -  Trouble concentrating 0 -  Moving slowly or fidgety/restless 0 -  Suicidal thoughts 0 -  PHQ-9 Score 0 -  Difficult doing work/chores Not difficult at all -

## 2018-11-06 DIAGNOSIS — H35371 Puckering of macula, right eye: Secondary | ICD-10-CM | POA: Diagnosis not present

## 2018-11-06 DIAGNOSIS — H26493 Other secondary cataract, bilateral: Secondary | ICD-10-CM | POA: Diagnosis not present

## 2018-11-06 DIAGNOSIS — H35362 Drusen (degenerative) of macula, left eye: Secondary | ICD-10-CM | POA: Diagnosis not present

## 2018-11-06 DIAGNOSIS — H43813 Vitreous degeneration, bilateral: Secondary | ICD-10-CM | POA: Diagnosis not present

## 2018-11-09 ENCOUNTER — Other Ambulatory Visit: Payer: Self-pay

## 2018-11-09 MED ORDER — GABAPENTIN 100 MG PO CAPS: 200.0000 mg | ORAL_CAPSULE | ORAL | 5 refills | Status: DC

## 2018-11-11 DIAGNOSIS — H524 Presbyopia: Secondary | ICD-10-CM | POA: Diagnosis not present

## 2018-12-28 ENCOUNTER — Other Ambulatory Visit: Payer: Self-pay

## 2018-12-28 ENCOUNTER — Encounter: Payer: Self-pay | Admitting: Family Medicine

## 2018-12-28 ENCOUNTER — Ambulatory Visit (INDEPENDENT_AMBULATORY_CARE_PROVIDER_SITE_OTHER): Payer: Medicare Other | Admitting: Family Medicine

## 2018-12-28 VITALS — BP 124/76 | HR 69 | Temp 97.2°F | Ht 66.0 in | Wt 153.8 lb

## 2018-12-28 DIAGNOSIS — N183 Chronic kidney disease, stage 3 unspecified: Secondary | ICD-10-CM

## 2018-12-28 DIAGNOSIS — F419 Anxiety disorder, unspecified: Secondary | ICD-10-CM

## 2018-12-28 DIAGNOSIS — E785 Hyperlipidemia, unspecified: Secondary | ICD-10-CM | POA: Diagnosis not present

## 2018-12-28 DIAGNOSIS — E039 Hypothyroidism, unspecified: Secondary | ICD-10-CM | POA: Diagnosis not present

## 2018-12-28 DIAGNOSIS — K219 Gastro-esophageal reflux disease without esophagitis: Secondary | ICD-10-CM

## 2018-12-28 DIAGNOSIS — I10 Essential (primary) hypertension: Secondary | ICD-10-CM

## 2018-12-28 LAB — HEPATIC FUNCTION PANEL
ALT: 22 U/L (ref 0–53)
AST: 17 U/L (ref 0–37)
Albumin: 4.5 g/dL (ref 3.5–5.2)
Alkaline Phosphatase: 66 U/L (ref 39–117)
Bilirubin, Direct: 0.1 mg/dL (ref 0.0–0.3)
Total Bilirubin: 0.5 mg/dL (ref 0.2–1.2)
Total Protein: 6.7 g/dL (ref 6.0–8.3)

## 2018-12-28 LAB — LIPID PANEL
Cholesterol: 210 mg/dL — ABNORMAL HIGH (ref 0–200)
HDL: 53.4 mg/dL (ref >39.00)
NonHDL: 156.22
Total CHOL/HDL Ratio: 4
Triglycerides: 256 mg/dL — ABNORMAL HIGH (ref 0.0–149.0)
VLDL: 51.2 mg/dL — ABNORMAL HIGH (ref 0.0–40.0)

## 2018-12-28 LAB — BASIC METABOLIC PANEL
BUN: 23 mg/dL (ref 6–23)
CO2: 27 mEq/L (ref 19–32)
Calcium: 9.4 mg/dL (ref 8.4–10.5)
Chloride: 105 mEq/L (ref 96–112)
Creatinine, Ser: 1.36 mg/dL (ref 0.40–1.50)
GFR: 49.8 mL/min — ABNORMAL LOW (ref >60.00)
Glucose, Bld: 114 mg/dL — ABNORMAL HIGH (ref 70–99)
Potassium: 4.7 mEq/L (ref 3.5–5.1)
Sodium: 141 mEq/L (ref 135–145)

## 2018-12-28 LAB — LDL CHOLESTEROL, DIRECT: Direct LDL: 123 mg/dL

## 2018-12-28 LAB — TSH: TSH: 1.7 u[IU]/mL (ref 0.35–4.50)

## 2018-12-28 MED ORDER — LORAZEPAM 1 MG PO TABS: 0.5000 mg | ORAL_TABLET | Freq: Every day | ORAL | 2 refills | Status: DC

## 2018-12-28 NOTE — Progress Notes (Signed)
  Subjective:     Patient ID: Timothy Garrett, male   DOB: 03-26-1931, 85 y.o.   MRN: [id number]  HPI Timothy Garrett seen today for medical follow-up.  His chronic medical problems include hypertension, history of reported IBS, GERD, hypothyroidism, chronic kidney disease stage III, BPH, spinal stenosis, hyperlipidemia.  Timothy Garrett has past history of diverticulitis of the colon.  Generally doing well currently.  Timothy Garrett has been married 54 years.  Timothy Garrett is very hard of hearing which makes communication somewhat difficult especially with mask use.  Patient is due for labs.  We reviewed his medications.  Timothy Garrett is compliant with all.  Timothy Garrett has history of some chronic neck issues and apparently was placed on low-dose lorazepam many years ago by another physician for that and takes 1/2 tablet at night and has done so for many years.  Timothy Garrett apparently was placed on gabapentin for leg cramps and is not sure if these are making much difference.  Timothy Garrett tries to stay well-hydrated  Denies any recent falls.  No depression issues.  Past Medical History:  Diagnosis Date  . Arthritis   . Diverticulitis   . Diverticulitis   . GERD (gastroesophageal reflux disease)    Heartburn  . Hyperlipemia   . Hypertension   . IBS (irritable bowel syndrome)   . Kidney stones    Past Surgical History:  Procedure Laterality Date  . BACK SURGERY    . EYE SURGERY  2016   cataract surgery  . TONSILLECTOMY  1941  . ulcer surgery      reports that Timothy Garrett has quit smoking. Timothy Garrett has never used smokeless tobacco. Timothy Garrett reports that Timothy Garrett does not drink alcohol or use drugs. family history includes Heart attack in his father, paternal grandfather, and sister; Heart disease in his father; Hypertension in his mother; Stomach cancer in his mother; Stroke in his maternal grandfather. Allergies  Allergen Reactions  . Sulfamethoxazole-Trimethoprim Itching     Review of Systems  Constitutional: Negative for fatigue.  HENT: Positive for hearing loss.   Eyes:  Negative for visual disturbance.  Respiratory: Negative for cough, chest tightness and shortness of breath.   Cardiovascular: Negative for chest pain, palpitations and leg swelling.  Endocrine: Negative for polydipsia and polyuria.  Genitourinary: Negative for dysuria.  Neurological: Negative for dizziness, syncope, weakness, light-headedness and headaches.       Objective:   Physical Exam Constitutional:      Appearance: Normal appearance.  Neck:     Musculoskeletal: Neck supple.  Cardiovascular:     Rate and Rhythm: Normal rate and regular rhythm.  Pulmonary:     Effort: Pulmonary effort is normal.     Breath sounds: Normal breath sounds.  Musculoskeletal:     Right lower leg: No edema.     Left lower leg: No edema.  Neurological:     Mental Status: Timothy Garrett is alert.        Assessment:     #1 hypertension stable and at goal  #2 GERD controlled with Protonix  #3 hyperlipidemia treated with statin  #4 hypothyroidism.  Due for follow-up labs    Plan:     -Check follow-up labs today including comprehensive chemistry panel, lipid panel, TSH -Flu vaccine already given -Discussed fall prevention -Refill lorazepam for low-dose use at night which Timothy Garrett has been on for several years apparently  Eulas Post MD [facility] at [facility]

## 2019-01-25 ENCOUNTER — Encounter: Payer: Self-pay | Admitting: Family Medicine

## 2019-01-25 ENCOUNTER — Other Ambulatory Visit: Payer: Self-pay

## 2019-01-25 ENCOUNTER — Ambulatory Visit (INDEPENDENT_AMBULATORY_CARE_PROVIDER_SITE_OTHER): Payer: Medicare Other | Admitting: Family Medicine

## 2019-01-25 VITALS — BP 112/62 | HR 100 | Temp 97.4°F | Ht 66.0 in | Wt 158.2 lb

## 2019-01-25 DIAGNOSIS — K5792 Diverticulitis of intestine, part unspecified, without perforation or abscess without bleeding: Secondary | ICD-10-CM | POA: Diagnosis not present

## 2019-01-25 MED ORDER — METRONIDAZOLE 500 MG PO TABS: 500.0000 mg | ORAL_TABLET | Freq: Three times a day (TID) | ORAL | 0 refills | Status: DC

## 2019-01-25 MED ORDER — CIPROFLOXACIN HCL 500 MG PO TABS: 500.0000 mg | ORAL_TABLET | Freq: Two times a day (BID) | ORAL | 0 refills | Status: DC

## 2019-01-25 NOTE — Patient Instructions (Signed)
Diverticulitis  Diverticulitis is infection or inflammation of small pouches (diverticula) in the colon that form due to a condition called diverticulosis. Diverticula can trap stool (feces) and bacteria, causing infection and inflammation. Diverticulitis may cause severe stomach pain and diarrhea. It may lead to tissue damage in the colon that causes bleeding. The diverticula may also burst (rupture) and cause infected stool to enter other areas of the abdomen. Complications of diverticulitis can include:  Bleeding.  Severe infection.  Severe pain.  Rupture (perforation) of the colon.  Blockage (obstruction) of the colon. What are the causes? This condition is caused by stool becoming trapped in the diverticula, which allows bacteria to grow in the diverticula. This leads to inflammation and infection. What increases the risk? You are more likely to develop this condition if:  You have diverticulosis. The risk for diverticulosis increases if: ? You are overweight or obese. ? You use tobacco products. ? You do not get enough exercise.  You eat a diet that does not include enough fiber. High-fiber foods include fruits, vegetables, beans, nuts, and whole grains. What are the signs or symptoms? Symptoms of this condition may include:  Pain and tenderness in the abdomen. The pain is normally located on the left side of the abdomen, but it may occur in other areas.  Fever and chills.  Bloating.  Cramping.  Nausea.  Vomiting.  Changes in bowel routines.  Blood in your stool. How is this diagnosed? This condition is diagnosed based on:  Your medical history.  A physical exam.  Tests to make sure there is nothing else causing your condition. These tests may include: ? Blood tests. ? Urine tests. ? Imaging tests of the abdomen, including X-rays, ultrasounds, MRIs, or CT scans. How is this treated? Most cases of this condition are mild and can be treated at home.  Treatment may include:  Taking over-the-counter pain medicines.  Following a clear liquid diet.  Taking antibiotic medicines by mouth.  Rest. More severe cases may need to be treated at a hospital. Treatment may include:  Not eating or drinking.  Taking prescription pain medicine.  Receiving antibiotic medicines through an IV tube.  Receiving fluids and nutrition through an IV tube.  Surgery. When your condition is under control, your health care provider may recommend that you have a colonoscopy. This is an exam to look at the entire large intestine. During the exam, a lubricated, bendable tube is inserted into the anus and then passed into the rectum, colon, and other parts of the large intestine. A colonoscopy can show how severe your diverticula are and whether something else may be causing your symptoms. Follow these instructions at home: Medicines  Take over-the-counter and prescription medicines only as told by your health care provider. These include fiber supplements, probiotics, and stool softeners.  If you were prescribed an antibiotic medicine, take it as told by your health care provider. Do not stop taking the antibiotic even if you start to feel better.  Do not drive or use heavy machinery while taking prescription pain medicine. General instructions   Follow a full liquid diet or another diet as directed by your health care provider. After your symptoms improve, your health care provider may tell you to change your diet. He or she may recommend that you eat a diet that contains at least 25 g (25 grams) of fiber daily. Fiber makes it easier to pass stool. Healthy sources of fiber include: ? Berries. One cup contains 4-8 grams of   fiber. ? Beans or lentils. One half cup contains 5-8 grams of fiber. ? Green vegetables. One cup contains 4 grams of fiber.  Exercise for at least 30 minutes, 3 times each week. You should exercise hard enough to raise your heart rate and  break a sweat.  Keep all follow-up visits as told by your health care provider. This is important. You may need a colonoscopy. Contact a health care provider if:  Your pain does not improve.  You have a hard time drinking or eating food.  Your bowel movements do not return to normal. Get help right away if:  Your pain gets worse.  Your symptoms do not get better with treatment.  Your symptoms suddenly get worse.  You have a fever.  You vomit more than one time.  You have stools that are bloody, black, or tarry. Summary  Diverticulitis is infection or inflammation of small pouches (diverticula) in the colon that form due to a condition called diverticulosis. Diverticula can trap stool (feces) and bacteria, causing infection and inflammation.  You are at higher risk for this condition if you have diverticulosis and you eat a diet that does not include enough fiber.  Most cases of this condition are mild and can be treated at home. More severe cases may need to be treated at a hospital.  When your condition is under control, your health care provider may recommend that you have an exam called a colonoscopy. This exam can show how severe your diverticula are and whether something else may be causing your symptoms. This information is not intended to replace advice given to you by your health care provider. Make sure you discuss any questions you have with your health care provider. Document Released: 11/07/2004 Document Revised: 01/10/2017 Document Reviewed: 03/02/2016 Elsevier Patient Education  2020 Elsevier Inc.  

## 2019-01-25 NOTE — Progress Notes (Signed)
Subjective:     Patient ID: Timothy Garrett, male   DOB: [birth year], 85 y.o.   MRN: [id number]  HPI   [name] is seen with left lower quadrant abdominal pain which started last night about the time he went to bed.  He states he had fairly severe pain through much of the night left lower quadrant without radiation.  He has been treated 3 times previously for acute diverticulitis including 2016, 2017, 2018.  He states that the symptoms are exactly the same.  He has had previous acute diverticulitis confirmed on CAT scans.  On one episode he had some bleeding but has not had any bleeding with this episodes.  He denies any fever.  No nausea or vomiting.  Skip down fluids today but has been intentionally eating less  He has allergy to sulfa.  He is apparently tolerated Flagyl and Cipro in the past which have seemed to work for him.  Pain is actually slightly better today compared with last night.  Denies any diarrhea or constipation symptoms.  No dysuria.  Past Medical History:  Diagnosis Date  . Arthritis   . Diverticulitis   . Diverticulitis   . GERD (gastroesophageal reflux disease)    Heartburn  . Hyperlipemia   . Hypertension   . IBS (irritable bowel syndrome)   . Kidney stones    Past Surgical History:  Procedure Laterality Date  . BACK SURGERY    . EYE SURGERY  2016   cataract surgery  . TONSILLECTOMY  1941  . ulcer surgery      reports that he has quit smoking. He has never used smokeless tobacco. He reports that he does not drink alcohol or use drugs. family history includes Heart attack in his father, paternal grandfather, and sister; Heart disease in his father; Hypertension in his mother; Stomach cancer in his mother; Stroke in his maternal grandfather. Allergies  Allergen Reactions  . Sulfamethoxazole-Trimethoprim Itching     Review of Systems  Constitutional: Negative for chills and fever.  Respiratory: Negative for shortness of breath.   Cardiovascular:  Negative for chest pain.  Gastrointestinal: Positive for abdominal pain. Negative for blood in stool, constipation, diarrhea, nausea and vomiting.  Genitourinary: Negative for dysuria.  Neurological: Negative for dizziness.  Psychiatric/Behavioral: Negative for confusion.       Objective:   Physical Exam Vitals reviewed.  Constitutional:      Appearance: Normal appearance.  Cardiovascular:     Rate and Rhythm: Normal rate and regular rhythm.  Pulmonary:     Effort: Pulmonary effort is normal.     Breath sounds: Normal breath sounds.  Abdominal:     General: Bowel sounds are normal.     Palpations: Abdomen is soft.     Tenderness: There is abdominal tenderness. There is no guarding or rebound.     Comments: He has tenderness left lower quadrant to palpation.  Abdomen is soft  Neurological:     Mental Status: He is alert.        Assessment:     Abdominal pain left lower quadrant.  Suspect acute diverticulitis which he has had 3 times in the past.  He states these symptoms are exactly the same.  Nonacute abdomen by current exam.    Plan:     -Start Cipro 500 mg twice daily for 10 days and Flagyl 500 mg 3 times daily for 10 days -Stay well-hydrated -Follow-up promptly for any recurrent vomiting, worsening pain, or other concerns.  Touch base  if pain not improving over the next couple of days.  Eulas Post MD [facility] at [facility]

## 2019-02-03 ENCOUNTER — Other Ambulatory Visit: Payer: Self-pay | Admitting: Family Medicine

## 2019-02-03 DIAGNOSIS — E039 Hypothyroidism, unspecified: Secondary | ICD-10-CM

## 2019-02-03 MED ORDER — LEVOTHYROXINE SODIUM 50 MCG PO TABS: 50.0000 ug | ORAL_TABLET | Freq: Every day | ORAL | 0 refills | Status: DC

## 2019-02-03 NOTE — Telephone Encounter (Signed)
Copied from [place] [id number]. Topic: Quick Communication - Rx Refill/Question >> Feb 03, 2019 11:31 AM Yvette Rack wrote: Medication: levothyroxine (SYNTHROID, LEVOTHROID) 50 MCG tablet  Has the patient contacted their pharmacy? yes   Preferred Pharmacy (with phone number or street name): [facility] [id number] - [place], [place]  Phone: [phone]  Fax: [phone]  Agent: Please be advised that RX refills may take up to 3 business days. We ask that you follow-up with your pharmacy.

## 2019-02-03 NOTE — Telephone Encounter (Signed)
Requested medication (s) are due for refill today: no  Requested medication (s) are on the active medication list: yes  Last refill:  09/23/2010  Future visit scheduled: no  Notes to clinic:  Last prescribed by different provider  Review for refill   Requested Prescriptions  Pending Prescriptions Disp Refills   levothyroxine (SYNTHROID) 50 MCG tablet 90 tablet 0    Sig: Take 1 tablet (50 mcg total) by mouth daily.      Endocrinology:  Hypothyroid Agents Failed - 02/03/2019 11:35 AM      Failed - TSH needs to be rechecked within 3 months after an abnormal result. Refill until TSH is due.      Passed - TSH in normal range and within 360 days    TSH  Date Value Ref Range Status  12/28/2018 1.70 0.35 - 4.50 uIU/mL Final          Passed - Valid encounter within last 12 months    Recent Outpatient Visits           1 week ago Acute diverticulitis   Therapist, music at [place], MD   1 month ago Essential hypertension   Therapist, music at [facility], Alinda Sierras, MD   5 months ago Leg cramps   Therapist, music at [facility], Alinda Sierras, MD   9 months ago Tendinitis of left rotator cuff   Therapist, music at [facility], Alinda Sierras, MD   10 months ago Acute pain of left shoulder   Therapist, music at [facility], Alinda Sierras, MD

## 2019-02-12 ENCOUNTER — Other Ambulatory Visit: Payer: Self-pay | Admitting: Family Medicine

## 2019-02-17 ENCOUNTER — Other Ambulatory Visit: Payer: Self-pay | Admitting: Family Medicine

## 2019-02-17 DIAGNOSIS — I10 Essential (primary) hypertension: Secondary | ICD-10-CM

## 2019-03-10 ENCOUNTER — Ambulatory Visit: Payer: Medicare Other

## 2019-03-17 ENCOUNTER — Other Ambulatory Visit: Payer: Self-pay | Admitting: Family Medicine

## 2019-03-17 DIAGNOSIS — I10 Essential (primary) hypertension: Secondary | ICD-10-CM

## 2019-03-19 ENCOUNTER — Ambulatory Visit: Payer: Medicare Other | Attending: Internal Medicine

## 2019-03-19 DIAGNOSIS — Z23 Encounter for immunization: Secondary | ICD-10-CM | POA: Insufficient documentation

## 2019-03-19 NOTE — Progress Notes (Signed)
   Covid-19 Vaccination Clinic  Name:  Timothy Garrett    MRN: [id number] DOB: 03/21/1931  03/19/2019  [name] was observed post Covid-19 immunization for 15 minutes without incidence. He was provided with Vaccine Information Sheet and instruction to access the V-Safe system.   [name] was instructed to call 911 with any severe reactions post vaccine: Marland Kitchen Difficulty breathing  . Swelling of your face and throat  . A fast heartbeat  . A bad rash all over your body  . Dizziness and weakness    Immunizations Administered    Name Date Dose VIS Date Route   Pfizer COVID-19 Vaccine 03/19/2019  9:57 AM 0.3 mL 01/22/2019 Intramuscular   Manufacturer: [place]   Lot: [id number]   [place]: [id number]

## 2019-03-30 ENCOUNTER — Telehealth: Payer: Self-pay | Admitting: Family Medicine

## 2019-03-30 NOTE — Telephone Encounter (Signed)
Left message for patient to call back and schedule Medicare Annual Wellness Visit (AWV) either virtually,audio only or in person (whichever the patient prefers--45 MINUTES).  Last AWV 2.11.20; please schedule at anytime with [facility]Nurse Health Advisor at [facility] [place].

## 2019-04-13 ENCOUNTER — Ambulatory Visit: Payer: Medicare Other | Attending: Internal Medicine

## 2019-04-13 ENCOUNTER — Other Ambulatory Visit: Payer: Self-pay | Admitting: *Deleted

## 2019-04-13 DIAGNOSIS — Z23 Encounter for immunization: Secondary | ICD-10-CM | POA: Insufficient documentation

## 2019-04-13 MED ORDER — PANTOPRAZOLE SODIUM 40 MG PO TBEC: DELAYED_RELEASE_TABLET | ORAL | 0 refills | Status: DC

## 2019-04-13 NOTE — Progress Notes (Signed)
   Covid-19 Vaccination Clinic  Name:  Timothy Garrett    MRN: [id number] DOB: 01-22-1932  04/13/2019  Timothy Garrett was observed post Covid-19 immunization for 15 minutes without incident. He was provided with Vaccine Information Sheet and instruction to access the V-Safe system.   Timothy Garrett was instructed to call 911 with any severe reactions post vaccine: Marland Kitchen Difficulty breathing  . Swelling of face and throat  . A fast heartbeat  . A bad rash all over body  . Dizziness and weakness   Immunizations Administered    Name Date Dose VIS Date Route   Pfizer COVID-19 Vaccine 04/13/2019 10:46 AM 0.3 mL 01/22/2019 Intramuscular   Manufacturer: [place]   Lot: [id number]   [place]: [id number]

## 2019-04-26 ENCOUNTER — Other Ambulatory Visit: Payer: Self-pay

## 2019-04-26 ENCOUNTER — Ambulatory Visit (INDEPENDENT_AMBULATORY_CARE_PROVIDER_SITE_OTHER): Payer: Medicare Other | Admitting: Family Medicine

## 2019-04-26 VITALS — BP 124/64 | HR 92 | Temp 98.2°F | Wt 158.5 lb

## 2019-04-26 DIAGNOSIS — I10 Essential (primary) hypertension: Secondary | ICD-10-CM

## 2019-04-26 DIAGNOSIS — M48061 Spinal stenosis, lumbar region without neurogenic claudication: Secondary | ICD-10-CM

## 2019-04-26 DIAGNOSIS — M720 Palmar fascial fibromatosis [Dupuytren]: Secondary | ICD-10-CM

## 2019-04-26 DIAGNOSIS — H9191 Unspecified hearing loss, right ear: Secondary | ICD-10-CM

## 2019-04-26 NOTE — Patient Instructions (Signed)
rDupuytren's Contracture Dupuytren's contracture is a condition in which tissue under the skin of the palm becomes thick. This causes one or more of the fingers to curl inward (contract) toward the palm. After a while, the fingers may not be able to straighten out. This condition affects some or all of the fingers and the palm of the hand. This condition may affect one or both hands. Dupuytren's contracture is a long-term (chronic) condition that develops (progresses) slowly over time. There is no cure, but symptoms can be managed and progression can be slowed with treatment. This condition is usually not dangerous or painful, but it can interfere with everyday tasks. What are the causes?  This condition is caused by tissue (fascia) in the palm that gets thicker and tighter. When the fascia thickens, it pulls on the cords of tissue (tendons) that control finger movement. This causes the fingers to contract. The cause of fascia thickening is not known. However, the condition is often passed along from parent to child (inherited). What increases the risk? The following factors may make you more likely to develop this condition:  Being 71 years of age or older.  Being male.  Having a family history of this condition.  Using tobacco products, including cigarettes, chewing tobacco, and e-cigarettes.  Drinking alcohol excessively.  Having diabetes.  Having a seizure disorder. What are the signs or symptoms? Early symptoms of this condition may include:  Thick, puckered skin on the hand.  One or more lumps (nodules) on the palm. Nodules may be tender when they first appear, but they are generally painless. Later symptoms of this condition may include:  Thick cords of tissue in the palm.  Fingers curled up toward the palm.  Inability to straighten the fingers into their normal position. Though this condition is usually painless, you may have discomfort when holding or grabbing  objects. How is this diagnosed? This condition is diagnosed with a physical exam, which may include:  Looking at your hands and feeling your palms. This is to check for thickened fascia and nodules.  Measuring finger motion.  Doing the [place] tabletop test. You may be asked to try to put your hand on a surface, with your palm down and your fingers straight out. How is this treated? There is no cure for this condition, but treatment can relieve discomfort and make symptoms more manageable. Treatment options may include:  Physical therapy. This can strengthen your hand and increase flexibility.  Occupational therapy. This can help you with everyday tasks that may be more difficult because of your condition.  Shots (injections). Substances may be injected into your hand, such as: ? Medicines that help to decrease swelling (corticosteroids). ? Proteins (collagenase) to weaken thick tissue. After a collagenase injection, your health care provider may stretch your fingers.  Needle aponeurotomy. A needle is pushed through the skin and into the fascia. Moving the needle against the fascia can weaken or break up the thick tissue.  Surgery. This may be needed if your condition causes discomfort or interferes with everyday activities. Physical therapy is usually needed after surgery. No treatment is guaranteed to cure this condition. Recurrence of symptoms is common. Follow these instructions at home: Hand care  Take these actions to help protect your hand from possible injury: ? Use tools that have padded grips. ? Wear protective gloves while you work with your hands. ? Avoid repetitive hand movements. General instructions  Take over-the-counter and prescription medicines only as told by your health care  provider.  Manage any other conditions that you have, such as diabetes.  If physical therapy was prescribed, do exercises as told by your health care provider.  Do not use any products  that contain nicotine or tobacco, such as cigarettes, e-cigarettes, and chewing tobacco. If you need help quitting, ask your health care provider.  If you drink alcohol: ? Limit how much you use to:  0-1 drink a day for women.  0-2 drinks a day for men. ? Be aware of how much alcohol is in your drink. In the [place] one drink equals one 12 oz bottle of beer (355 mL), one 5 oz glass of wine (148 mL), or one 1 oz glass of hard liquor (44 mL).  Keep all follow-up visits as told by your health care provider. This is important. Contact a health care provider if:  You develop new symptoms, or your symptoms get worse.  You have pain that gets worse or does not get better with medicine.  You have difficulty or discomfort with everyday tasks.  You develop numbness or tingling. Get help right away if:  You have severe pain.  Your fingers change color or become unusually cold. Summary  Dupuytren's contracture is a condition in which tissue under the skin of the palm becomes thick.  This condition is caused by tissue (fascia) that thickens. When it thickens, it pulls on the cords of tissue (tendons) that control finger movement and makes the fingers to contract.  You are more likely to develop this condition if you are a man, are over 40 years of age, have a family history of the condition, and drink a lot of alcohol.  This condition can be treated with physical and occupational therapy, injections, and surgery.  Follow instructions about how to care for your hand. Get help right away if you have severe pain or your fingers change color or become cold. This information is not intended to replace advice given to you by your health care provider. Make sure you discuss any questions you have with your health care provider. Document Revised: 08/19/2017 Document Reviewed: 08/19/2017 Elsevier Patient Education  2020 Elsevier Inc.  

## 2019-04-26 NOTE — Progress Notes (Signed)
Subjective:     Patient ID: Timothy Garrett, male   DOB: Mar 31, 1931, 86 y.o.   MRN: [id number]  HPI [name] is seen for the following issues  Right hand thickening.  Just noted a few weeks ago.  Nonpainful.  Denies any contracture of the finger.  No history of trigger finger.  No injury.  Right-hand-dominant.  He has deafness mostly involving the right ear.  He is considering whether to look at cochlear implant surgery over at [facility].  Requesting handicap sticker to be completed.  He has had significant back difficulties in the past and has difficulty ambulating very far because of that.  He has hypertension treated with lisinopril.  Blood pressures been stable.  His TSH was at goal last fall.  Lipids were up slightly.  Past Medical History:  Diagnosis Date  . Arthritis   . Diverticulitis   . Diverticulitis   . GERD (gastroesophageal reflux disease)    Heartburn  . Hyperlipemia   . Hypertension   . IBS (irritable bowel syndrome)   . Kidney stones    Past Surgical History:  Procedure Laterality Date  . BACK SURGERY    . EYE SURGERY  2016   cataract surgery  . TONSILLECTOMY  1941  . ulcer surgery      reports that he has quit smoking. He has never used smokeless tobacco. He reports that he does not drink alcohol or use drugs. family history includes Heart attack in his father, paternal grandfather, and sister; Heart disease in his father; Hypertension in his mother; Stomach cancer in his mother; Stroke in his maternal grandfather. Allergies  Allergen Reactions  . Sulfamethoxazole-Trimethoprim Itching     Review of Systems  Constitutional: Negative for fatigue.  Eyes: Negative for visual disturbance.  Respiratory: Negative for cough, chest tightness and shortness of breath.   Cardiovascular: Negative for chest pain, palpitations and leg swelling.  Endocrine: Negative for polydipsia and polyuria.  Neurological: Negative for dizziness, syncope, weakness,  light-headedness and headaches.       Objective:   Physical Exam Constitutional:      Appearance: He is well-developed.  HENT:     Right Ear: External ear normal.     Left Ear: External ear normal.  Eyes:     Pupils: Pupils are equal, round, and reactive to light.  Neck:     Thyroid: No thyromegaly.  Cardiovascular:     Rate and Rhythm: Normal rate and regular rhythm.  Pulmonary:     Effort: Pulmonary effort is normal. No respiratory distress.     Breath sounds: Normal breath sounds. No wheezing or rales.  Musculoskeletal:     Cervical back: Neck supple.     Comments: He has mild thickening and contracture right hand fourth digit consistent with Dupuytren's contracture.  No trigger finger.  Full range of motion all digits of the hand  Neurological:     Mental Status: He is alert and oriented to person, place, and time.        Assessment:     #1 Dupuytren's contracture right hand fourth digit not painful and not limiting motion at this time  #2 hypertension stable and at goal  #3 right ear deafness  #4 chronic back pain-with history of lumbar stenosis.  Requesting handicap parking form completion    Plan:     -Discussed diagnosis of Dupuytren's contracture.  Would not recommend any further intervention at this point since this is not functionally limiting him and no associated  pain  - Form completed for handicap parking  -Tried to answer questions regarding his hearing predicament.  He is considering consult over at [facility] and will let us know if he needs referral for that  -Continue current medications  Eulas Post MD [facility]

## 2019-05-05 ENCOUNTER — Other Ambulatory Visit: Payer: Self-pay | Admitting: Family Medicine

## 2019-05-05 DIAGNOSIS — E039 Hypothyroidism, unspecified: Secondary | ICD-10-CM

## 2019-05-12 ENCOUNTER — Other Ambulatory Visit: Payer: Self-pay | Admitting: Family Medicine

## 2019-06-04 ENCOUNTER — Ambulatory Visit (HOSPITAL_COMMUNITY)
Admission: EM | Admit: 2019-06-04 | Discharge: 2019-06-04 | Disposition: A | Discharge status: Home / Self Care | Payer: Medicare Other | Attending: Urgent Care | Admitting: Urgent Care

## 2019-06-04 ENCOUNTER — Other Ambulatory Visit: Payer: Self-pay

## 2019-06-04 ENCOUNTER — Telehealth: Payer: Self-pay | Admitting: Family Medicine

## 2019-06-04 ENCOUNTER — Encounter (HOSPITAL_COMMUNITY): Payer: Self-pay

## 2019-06-04 DIAGNOSIS — R1032 Left lower quadrant pain: Secondary | ICD-10-CM

## 2019-06-04 DIAGNOSIS — Z8719 Personal history of other diseases of the digestive system: Secondary | ICD-10-CM

## 2019-06-04 MED ORDER — METRONIDAZOLE 500 MG PO TABS: 500.0000 mg | ORAL_TABLET | Freq: Three times a day (TID) | ORAL | 0 refills | Status: DC

## 2019-06-04 MED ORDER — CIPROFLOXACIN HCL 500 MG PO TABS: 500.0000 mg | ORAL_TABLET | Freq: Two times a day (BID) | ORAL | 0 refills | Status: DC

## 2019-06-04 NOTE — Discharge Instructions (Addendum)
Please follow up with your regular doctor to schedule an outpatient CT scan of your abdomen. For now, I will restart you on ciprofloxacin and Flagyl to help treat a possible recurrence of diverticulitis. Please just use Tylenol at a dose of 500mg -650mg  once every 6 hours as needed for your aches, pains, fevers. Do not use any nonsteroidal anti-inflammatories (NSAIDs) like ibuprofen, Motrin, naproxen, Aleve, etc. which are all available over-the-counter.

## 2019-06-04 NOTE — Telephone Encounter (Signed)
Error

## 2019-06-04 NOTE — ED Provider Notes (Signed)
[place]   MRN: [id number] DOB: 02-26-1931  Subjective:   Timothy Garrett is a 86 y.o. male presenting for 2 day hx of acute onset persistent and worsening left lower quadrant pain. Has a hx of diverticulitis, last episode was 01/2019 per patient. Denies fever, n/v, diarrhea, constipation. Has not had bloody stools but reports that this does not always happen to him. Patient tried to get in with his PCP but they did not have any availability yesterday or today.   No current facility-administered medications for this encounter.  Current Outpatient Medications:  .  acetaminophen (TYLENOL) 500 MG tablet, Take 1,000 mg by mouth every 6 (six) hours as needed (for pain)., Disp: , Rfl:  .  aspirin 81 MG tablet, Take 81 mg by mouth at bedtime. , Disp: , Rfl:  .  atorvastatin (LIPITOR) 20 MG tablet, TAKE 0.5 TABLET BY MOUTH AT BEDTIME, Disp: 45 tablet, Rfl: 2 .  Cholecalciferol (VITAMIN D) 50 MCG (2000 UT) CAPS, Take 1 capsule by mouth daily., Disp: , Rfl:  .  ciprofloxacin (CIPRO) 500 MG tablet, Take 1 tablet (500 mg total) by mouth 2 (two) times daily., Disp: 20 tablet, Rfl: 0 .  dicyclomine (BENTYL) 20 MG tablet, Take 20 mg by mouth two times a day- morning and night, Disp: 180 tablet, Rfl: 1 .  gabapentin (NEURONTIN) 100 MG capsule, TAKE 2 CAPSULES BY MOUTH IN THE MORNING AND 3 CAPSULES AT BEDTIME, Disp: 150 capsule, Rfl: 5 .  levothyroxine (SYNTHROID) 50 MCG tablet, TAKE 1 TABLET(50 MCG) BY MOUTH DAILY, Disp: 90 tablet, Rfl: 0 .  lisinopril (ZESTRIL) 20 MG tablet, TAKE 1 TABLET(20 MG) BY MOUTH AT BEDTIME, Disp: 90 tablet, Rfl: 1 .  LORazepam (ATIVAN) 1 MG tablet, Take 0.5-1 tablets (0.5-1 mg total) by mouth at bedtime., Disp: 30 tablet, Rfl: 2 .  metroNIDAZOLE (FLAGYL) 500 MG tablet, Take 1 tablet (500 mg total) by mouth 3 (three) times daily., Disp: 30 tablet, Rfl: 0 .  Multiple Vitamins-Minerals (CENTRUM SILVER 50+MEN) TABS, Take 1 tablet by mouth daily with breakfast., Disp: ,  Rfl:  .  pantoprazole (PROTONIX) 40 MG tablet, Take 40 mg by mouth once a day, Disp: 90 tablet, Rfl: 0 .  tamsulosin (FLOMAX) 0.4 MG CAPS capsule, TAKE 1 CAPSULE BY MOUTH AT BEDTIME, Disp: 90 capsule, Rfl: 3   Allergies  Allergen Reactions  . Sulfamethoxazole-Trimethoprim Itching    Past Medical History:  Diagnosis Date  . Arthritis   . Diverticulitis   . Diverticulitis   . GERD (gastroesophageal reflux disease)    Heartburn  . Hyperlipemia   . Hypertension   . IBS (irritable bowel syndrome)   . Kidney stones      Past Surgical History:  Procedure Laterality Date  . BACK SURGERY    . EYE SURGERY  2016   cataract surgery  . TONSILLECTOMY  1941  . ulcer surgery      Family History  Problem Relation Age of Onset  . Stomach cancer Mother   . Hypertension Mother   . Heart disease Father   . Heart attack Father   . Heart attack Sister   . Stroke Maternal Grandfather   . Heart attack Paternal Grandfather     Social History   Tobacco Use  . Smoking status: Former Research scientist (life sciences)  . Smokeless tobacco: Never Used  . Tobacco comment: Quit 60 years ago  Substance Use Topics  . Alcohol use: No  . Drug use: No    ROS  Objective:   Vitals: BP 130/80 (BP Location: Left Arm)   Pulse 73   Temp 98 F (36.7 C) (Oral)   Resp 16   SpO2 97%   Physical Exam Constitutional:      General: He is not in acute distress.    Appearance: Normal appearance. He is well-developed. He is not ill-appearing, toxic-appearing or diaphoretic.  HENT:     Head: Normocephalic and atraumatic.     Right Ear: External ear normal.     Left Ear: External ear normal.     Nose: Nose normal.     Mouth/Throat:     Mouth: Mucous membranes are moist.     Pharynx: Oropharynx is clear.  Eyes:     General: No scleral icterus.    Extraocular Movements: Extraocular movements intact.     Pupils: Pupils are equal, round, and reactive to light.  Cardiovascular:     Rate and Rhythm: Normal rate and  regular rhythm.     Heart sounds: Normal heart sounds. No murmur. No friction rub. No gallop.   Pulmonary:     Effort: Pulmonary effort is normal. No respiratory distress.     Breath sounds: Normal breath sounds. No stridor. No wheezing, rhonchi or rales.  Abdominal:     General: Bowel sounds are normal. There is no distension.     Palpations: Abdomen is soft. There is no mass.     Tenderness: There is abdominal tenderness (over area outlined) in the left lower quadrant. There is no guarding or rebound.    Skin:    General: Skin is warm and dry.  Neurological:     Mental Status: He is alert and oriented to person, place, and time.  Psychiatric:        Mood and Affect: Mood normal.        Behavior: Behavior normal.        Thought Content: Thought content normal.     06/2016 FINDINGS:  Chest:    Mild dependent edema present in both lung bases. Normal heart size.  Abdomen and Pelvis:  #  Liver: Small cyst in the left lobe..   #  Gallbladder:Normal. #  Spleen:Normal #  Pancreas:Normal. #  Adrenals:Normal. #  Kidneys: Bilateral parapelvic renal cysts otherwise normal.. #  Ureters: Normal in course and caliber. #  Urinary Bladder: Normal. #  Reproductive organs: No free fluid or pelvic mass. #  Vessels:Vessels are normal in caliber.. Moderate aortic atherosclerosis #  Lymph nodes:No retroperitoneal or pelvic adenopathy. #  GI: Fat adjacent the distal descending colon is edematous. Numerous diverticula are present particularly on the sigmoid. No evidence of abscess or perforation. #  MSK:  No acute or aggressive bone lesions.. Degenerative disc disease predominating at L2-3 with posteriorly directed spur or calcified disc fragment   IMPRESSION:  Diverticulitis distal descending colon.  ####CODE SIGNIFICANT REPORT#### acute diverticulitis  Assessment and Plan :   PDMP not reviewed this encounter.  1. LLQ abdominal pain   2. History of diverticulitis of colon      Restart ciprofloxacin and Flagyl for empiric tx of recurrent diverticulitis. Emphasized need for f/u with his PCP for recheck and outpatient CT scan of his abdomen. Use Tylenol for pain control. Counseled patient on potential for adverse effects with medications prescribed/recommended today, strict ER and return-to-clinic precautions discussed, patient verbalized understanding.    Jaynee Eagles, PA-C 06/04/19 1204

## 2019-06-04 NOTE — ED Triage Notes (Signed)
Pt presents with Diverticulitis Flare up since waking up yesterday.

## 2019-06-24 ENCOUNTER — Other Ambulatory Visit: Payer: Self-pay

## 2019-06-24 ENCOUNTER — Ambulatory Visit: Payer: Medicare Other | Admitting: Family Medicine

## 2019-06-24 ENCOUNTER — Encounter: Payer: Self-pay | Admitting: Family Medicine

## 2019-06-24 VITALS — BP 120/60 | HR 94 | Temp 97.6°F | Wt 152.2 lb

## 2019-06-24 DIAGNOSIS — K5732 Diverticulitis of large intestine without perforation or abscess without bleeding: Secondary | ICD-10-CM

## 2019-06-24 LAB — CBC WITH DIFFERENTIAL/PLATELET
Basophils Absolute: 0.1 10*3/uL (ref 0.0–0.1)
Basophils Relative: 1.4 % (ref 0.0–3.0)
Eosinophils Absolute: 0.4 10*3/uL (ref 0.0–0.7)
Eosinophils Relative: 4.7 % (ref 0.0–5.0)
HCT: 40.5 % (ref 39.0–52.0)
Hemoglobin: 13.6 g/dL (ref 13.0–17.0)
Lymphocytes Relative: 18.4 % (ref 12.0–46.0)
Lymphs Abs: 1.5 10*3/uL (ref 0.7–4.0)
MCHC: 33.7 g/dL (ref 30.0–36.0)
MCV: 102.5 fl — ABNORMAL HIGH (ref 78.0–100.0)
Monocytes Absolute: 0.8 10*3/uL (ref 0.1–1.0)
Monocytes Relative: 9.2 % (ref 3.0–12.0)
Neutro Abs: 5.5 10*3/uL (ref 1.4–7.7)
Neutrophils Relative %: 66.3 % (ref 43.0–77.0)
Platelets: 278 10*3/uL (ref 150.0–400.0)
RBC: 3.95 Mil/uL — ABNORMAL LOW (ref 4.22–5.81)
RDW: 14 % (ref 11.5–15.5)
WBC: 8.2 10*3/uL (ref 4.0–10.5)

## 2019-06-24 LAB — BASIC METABOLIC PANEL
BUN: 15 mg/dL (ref 6–23)
CO2: 28 mEq/L (ref 19–32)
Calcium: 9.7 mg/dL (ref 8.4–10.5)
Chloride: 104 mEq/L (ref 96–112)
Creatinine, Ser: 1.23 mg/dL (ref 0.40–1.50)
GFR: 55.86 mL/min — ABNORMAL LOW (ref >60.00)
Glucose, Bld: 100 mg/dL — ABNORMAL HIGH (ref 70–99)
Potassium: 4.9 mEq/L (ref 3.5–5.1)
Sodium: 141 mEq/L (ref 135–145)

## 2019-06-24 MED ORDER — METRONIDAZOLE 500 MG PO TABS: 500.0000 mg | ORAL_TABLET | Freq: Three times a day (TID) | ORAL | 0 refills | Status: DC

## 2019-06-24 MED ORDER — CIPROFLOXACIN HCL 500 MG PO TABS: 500.0000 mg | ORAL_TABLET | Freq: Two times a day (BID) | ORAL | 0 refills | Status: DC

## 2019-06-24 NOTE — Progress Notes (Signed)
   Subjective:    Patient ID: Bowman Blazina, male    DOB: [birth year], 86 y.o.   MRN: [id number]  HPI Here for another bout of diverticulitis. He was treated by Dr. Elease Hashimoto for this in January, then he was seen in the ER for this on 06-04-19. He was treated with Cipro and Flagyl for 10 days. He seemed to clear up for a few weeks, then 4 days ago the same LLQ pain came back. No fever or nausea. His bowels move regularly. He has averaged 3-4 bout sof this a year for the past few years. His last CT was in 2009.    Review of Systems  Constitutional: Negative.   Respiratory: Negative.   Cardiovascular: Negative.   Gastrointestinal: Positive for abdominal pain. Negative for abdominal distention, anal bleeding, blood in stool, constipation, diarrhea, nausea, rectal pain and vomiting.  Genitourinary: Negative.        Objective:   Physical Exam Constitutional:      Appearance: Normal appearance. He is not ill-appearing.  Cardiovascular:     Rate and Rhythm: Normal rate and regular rhythm.     Pulses: Normal pulses.     Heart sounds: Normal heart sounds.  Pulmonary:     Effort: Pulmonary effort is normal.     Breath sounds: Normal breath sounds.  Abdominal:     General: Abdomen is flat. Bowel sounds are normal. There is no distension.     Palpations: Abdomen is soft. There is no mass.     Tenderness: There is no guarding or rebound.     Hernia: No hernia is present.     Comments: Moderately tender in the LLQ   Neurological:     Mental Status: He is alert.           Assessment & Plan:  Recurrent diverticulitis. Treat with Cipro and Flagyl but go for 14 days this time. Set up a CT of the abdomen and pelvis soon. Based on those results, he may want to have a conversation with Dr. Elease Hashimoto about whether it would be a good idea to have surgery.  Alysia Penna, MD

## 2019-07-01 ENCOUNTER — Telehealth: Payer: Self-pay | Admitting: Family Medicine

## 2019-07-01 NOTE — Telephone Encounter (Signed)
The patient saw Dr fry and he said he was going to order a CT scan wants to know when will the order be placed . Please advised

## 2019-07-02 NOTE — Telephone Encounter (Signed)
Spoke with the patients wife, she is aware.

## 2019-07-02 NOTE — Telephone Encounter (Signed)
I just ordered the CT

## 2019-07-02 NOTE — Addendum Note (Signed)
Addended by: Alysia Penna A on: 07/02/2019 04:03 PM   Modules accepted: Orders

## 2019-07-02 NOTE — Telephone Encounter (Signed)
Please advise. I do not see this order.

## 2019-07-05 ENCOUNTER — Ambulatory Visit
Admission: RE | Admit: 2019-07-05 | Discharge: 2019-07-05 | Disposition: A | Discharge status: Home / Self Care | Payer: Medicare Other | Source: Ambulatory Visit | Attending: Family Medicine | Admitting: Family Medicine

## 2019-07-05 DIAGNOSIS — R109 Unspecified abdominal pain: Secondary | ICD-10-CM | POA: Diagnosis not present

## 2019-07-05 DIAGNOSIS — K5732 Diverticulitis of large intestine without perforation or abscess without bleeding: Secondary | ICD-10-CM

## 2019-07-05 MED ORDER — IOPAMIDOL (ISOVUE-300) INJECTION 61%
100.0000 mL | Freq: Once | INTRAVENOUS | Status: AC | PRN
  Administered 2019-07-05: 100 mL via INTRAVENOUS

## 2019-07-07 ENCOUNTER — Other Ambulatory Visit: Payer: Self-pay | Admitting: Family Medicine

## 2019-07-28 DIAGNOSIS — H26493 Other secondary cataract, bilateral: Secondary | ICD-10-CM | POA: Diagnosis not present

## 2019-07-28 DIAGNOSIS — H532 Diplopia: Secondary | ICD-10-CM | POA: Diagnosis not present

## 2019-07-28 DIAGNOSIS — H353121 Nonexudative age-related macular degeneration, left eye, early dry stage: Secondary | ICD-10-CM | POA: Diagnosis not present

## 2019-07-31 ENCOUNTER — Other Ambulatory Visit: Payer: Self-pay | Admitting: Family Medicine

## 2019-07-31 DIAGNOSIS — E039 Hypothyroidism, unspecified: Secondary | ICD-10-CM

## 2019-08-04 ENCOUNTER — Ambulatory Visit (INDEPENDENT_AMBULATORY_CARE_PROVIDER_SITE_OTHER): Payer: Medicare Other | Admitting: Family Medicine

## 2019-08-04 ENCOUNTER — Other Ambulatory Visit: Payer: Self-pay

## 2019-08-04 ENCOUNTER — Encounter: Payer: Self-pay | Admitting: Family Medicine

## 2019-08-04 VITALS — BP 120/58 | HR 96 | Temp 97.7°F | Wt 148.9 lb

## 2019-08-04 DIAGNOSIS — H532 Diplopia: Secondary | ICD-10-CM | POA: Diagnosis not present

## 2019-08-04 LAB — TSH: TSH: 1.32 u[IU]/mL (ref 0.35–4.50)

## 2019-08-04 LAB — SEDIMENTATION RATE: Sed Rate: 8 mm/hr (ref 0–20)

## 2019-08-04 NOTE — Patient Instructions (Signed)
Diplopia Diplopia is a condition in which a person sees two of a single object. It is also called double vision. There are two types of diplopia.  Monocular diplopia. This is double vision that affects only one eye. Monocular diplopia is often caused by a clouding of the lens in your eye (cataract) or by a problem in the way your eye focuses light.  Binocular diplopia. This is double vision that affects both eyes. However, when you shut one eye, the double vision will go away. Binocular diplopia may be more serious. It can be caused by: ? Problems with the nerves or muscles that are responsible for eye movement. ? Disease of the nerves (neurologic disease). ? Immune system conditions, such as Graves' disease. ? Migraine headaches. ? Tumors. ? An infection. ? A stroke. ? An injury. There are many causes of diplopia. Some are not dangerous and can be easily corrected. Diplopia may also be a symptom of a serious medical problem. You may need to see a health care provider who specializes in eye conditions (ophthalmologist) or a nerve specialist (neurologist) to find the cause. Follow these instructions at home:   Pay attention to any changes in your vision. Tell your health care provider about them.  Do not drive or operate heavy machinery if diplopia interferes with your vision.  Keep all follow-up visits as told by your health care provider. This is important. Contact a health care provider if:  Your diplopia gets worse.  You develop any other symptoms along with your diplopia, such as: ? Weakness. ? Numbness. ? Headache. ? Eye pain. ? Clumsiness. ? Nausea. ? Drooping eyelids. ? Abnormal movement of one eye. Get help right away if you:  Have sudden vision loss.  Suddenly get a very bad headache.  Have sudden weakness or numbness.  Suddenly lose the ability to speak, understand speech, or both. These symptoms may represent a serious problem that is an emergency. Do not wait  to see if the symptoms will go away. Get medical help right away. Call your local emergency services (911 in the [place] Do not drive yourself to the hospital. Summary  Diplopia is a condition in which a person sees two of a single object. It is also called double vision.  Monocular diplopia is double vision that affects only one eye. It is often caused by a clouding of the lens in your eye (cataract) or by a problem in the way your eye focuses light.  Binocular diplopia is double vision that affects both eyes. However, when you shut one eye, the double vision will go away. Binocular diplopia may be more serious.  If you have diplopia, you may need to see a health care provider who specializes in eye conditions (ophthalmologist) or a nerve specialist (neurologist) to find the cause. This information is not intended to replace advice given to you by your health care provider. Make sure you discuss any questions you have with your health care provider. Document Revised: 01/22/2017 Document Reviewed: 01/22/2017 Elsevier Patient Education  2020 [employer] Inc.  

## 2019-08-04 NOTE — Progress Notes (Signed)
Established Patient Office Visit  Subjective:  Patient ID: Timothy Garrett, male    DOB: 05-22-1931  Age: 86 y.o. MRN: [id number]  CC:  Chief Complaint  Patient presents with   Blurred Vision    pt had a episode last week of blurred vision and last week with dizziness     HPI Timothy Garrett presents for episode 8 days ago of transient binocular diplopia.  Episode occurred on the 15th.  He was driving down [address] after going to [place].  He felt slightly lightheaded when he left the restaurant but had no headache.  While driving he noticed some double vision that did resolve when he closed either eye.  Symptoms lasted about 45 minutes.  There was no confusion.  No speech changes.  No focal weakness.  Symptoms eventually fully resolved.  He went to ophthalmologist the next day on the 16th and had full eye exam and no clear etiology determined.  He has no history of diabetes.  He has hypothyroidism and is on replacement.  No overt symptoms of hyperthyroidism.  No recent headache.  No loss of vision.  No generalized or focal weakness.  He has had no symptoms of recurrence whatsoever since then.  He did not have any associated eye pain.  No cognitive changes  Past Medical History:  Diagnosis Date   Arthritis    Diverticulitis    Diverticulitis    GERD (gastroesophageal reflux disease)    Heartburn   Hyperlipemia    Hypertension    IBS (irritable bowel syndrome)    Kidney stones     Past Surgical History:  Procedure Laterality Date   BACK SURGERY     EYE SURGERY  2016   cataract surgery   TONSILLECTOMY  1941   ulcer surgery      Family History  Problem Relation Age of Onset   Stomach cancer Mother    Hypertension Mother    Heart disease Father    Heart attack Father    Heart attack Sister    Stroke Maternal Grandfather    Heart attack Paternal Grandfather     Social History   Socioeconomic History   Marital status: Married     Spouse name: Not on file   Number of children: 2   Years of education: Not on file   Highest education level: Not on file  Occupational History   Occupation: Medical illustrator    Comment: Retired  Tobacco Use   Smoking status: Former Smoker   Smokeless tobacco: Never Used   Tobacco comment: Quit 60 years ago  Scientific laboratory technician Use: Never used  Substance and Sexual Activity   Alcohol use: No   Drug use: No   Sexual activity: Not Currently  Other Topics Concern   Not on file  Social History Narrative   Lives with wife on one level condo, recently downsized   Has two children, local, 4 grandchildren   Has one dog, Sam, enjoys walking it, but no other regular exercise   Enjoys doing Haematologist, Event organiser   Social Determinants of Radio broadcast assistant Strain:    Difficulty of Paying Living Expenses:   Food Insecurity:    Worried About Charity fundraiser in the Last Year:    Arboriculturist in the Last Year:   Transportation Needs:    Film/video editor (Medical):    Lack of Transportation (Non-Medical):   Physical Activity:    Days of  Exercise per Week:    Minutes of Exercise per Session:   Stress:    Feeling of Stress :   Social Connections:    Frequency of Communication with Friends and Family:    Frequency of Social Gatherings with Friends and Family:    Attends Religious Services:    Active Member of Clubs or Organizations:    Attends Music therapist:    Marital Status:   Intimate Partner Violence:    Fear of Current or Ex-Partner:    Emotionally Abused:    Physically Abused:    Sexually Abused:     Outpatient Medications Prior to Visit  Medication Sig Dispense Refill   acetaminophen (TYLENOL) 500 MG tablet Take 1,000 mg by mouth every 6 (six) hours as needed (for pain).     aspirin 81 MG tablet Take 81 mg by mouth at bedtime.      atorvastatin (LIPITOR) 20 MG tablet TAKE 0.5 TABLET BY MOUTH AT BEDTIME  45 tablet 2   Cholecalciferol (VITAMIN D) 50 MCG (2000 UT) CAPS Take 1 capsule by mouth daily.     ciprofloxacin (CIPRO) 500 MG tablet Take 1 tablet (500 mg total) by mouth 2 (two) times daily. 28 tablet 0   dicyclomine (BENTYL) 20 MG tablet Take 20 mg by mouth two times a day- morning and night 180 tablet 1   gabapentin (NEURONTIN) 100 MG capsule TAKE 2 CAPSULES BY MOUTH IN THE MORNING AND 3 CAPSULES AT BEDTIME 150 capsule 5   levothyroxine (SYNTHROID) 50 MCG tablet TAKE 1 TABLET(50 MCG) BY MOUTH DAILY 90 tablet 0   lisinopril (ZESTRIL) 20 MG tablet TAKE 1 TABLET(20 MG) BY MOUTH AT BEDTIME 90 tablet 1   LORazepam (ATIVAN) 1 MG tablet Take 0.5-1 tablets (0.5-1 mg total) by mouth at bedtime. 30 tablet 2   metroNIDAZOLE (FLAGYL) 500 MG tablet Take 1 tablet (500 mg total) by mouth 3 (three) times daily. DO NOT CONSUME ALCOHOL WHILE TAKING THIS MEDICATION. 42 tablet 0   Multiple Vitamins-Minerals (CENTRUM SILVER 50+MEN) TABS Take 1 tablet by mouth daily with breakfast.     pantoprazole (PROTONIX) 40 MG tablet TAKE 1 TABLET BY MOUTH EVERY DAY 90 tablet 0   tamsulosin (FLOMAX) 0.4 MG CAPS capsule TAKE 1 CAPSULE BY MOUTH AT BEDTIME 90 capsule 3   No facility-administered medications prior to visit.    Allergies  Allergen Reactions   Sulfamethoxazole-Trimethoprim Itching    ROS Review of Systems  Constitutional: Negative for appetite change, chills and fever.  Eyes: Positive for visual disturbance. Negative for pain.  Cardiovascular: Negative for chest pain.  Gastrointestinal: Negative for abdominal pain.  Neurological: Negative for dizziness, tremors, seizures, syncope, facial asymmetry, speech difficulty, weakness and headaches.  Psychiatric/Behavioral: Negative for confusion.      Objective:    Physical Exam Vitals reviewed.  Constitutional:      Appearance: Normal appearance.  Neck:     Comments: No carotid bruits Cardiovascular:     Rate and Rhythm: Normal rate and  regular rhythm.  Pulmonary:     Effort: Pulmonary effort is normal.     Breath sounds: Normal breath sounds.  Musculoskeletal:     Cervical back: Neck supple.  Neurological:     General: No focal deficit present.     Mental Status: He is alert.     Cranial Nerves: No cranial nerve deficit.     Motor: No weakness.     Coordination: Coordination normal.     Gait: Gait normal.  Comments: Extraocular movements are normal.  He does not have any easy muscle fatigability with handgrip or upward gaze     BP (!) 120/58 (BP Location: Left Arm, Patient Position: Sitting, Cuff Size: Normal)    Pulse 96    Temp 97.7 F (36.5 C) (Temporal)    Wt 148 lb 14.4 oz (67.5 kg)    SpO2 95%    BMI 24.03 kg/m  Wt Readings from Last 3 Encounters:  08/04/19 148 lb 14.4 oz (67.5 kg)  06/24/19 152 lb 3.2 oz (69 kg)  04/26/19 158 lb 8 oz (71.9 kg)     Health Maintenance Due  Topic Date Due   TETANUS/TDAP  08/20/2018    There are no preventive care reminders to display for this patient.  Lab Results  Component Value Date   TSH 1.70 12/28/2018   Lab Results  Component Value Date   WBC 8.2 06/24/2019   HGB 13.6 06/24/2019   HCT 40.5 06/24/2019   MCV 102.5 (H) 06/24/2019   PLT 278.0 06/24/2019   Lab Results  Component Value Date   NA 141 06/24/2019   K 4.9 06/24/2019   CO2 28 06/24/2019   GLUCOSE 100 (H) 06/24/2019   BUN 15 06/24/2019   CREATININE 1.23 06/24/2019   BILITOT 0.5 12/28/2018   ALKPHOS 66 12/28/2018   AST 17 12/28/2018   ALT 22 12/28/2018   PROT 6.7 12/28/2018   ALBUMIN 4.5 12/28/2018   CALCIUM 9.7 06/24/2019   ANIONGAP 9 04/28/2017   GFR 55.86 (L) 06/24/2019   Lab Results  Component Value Date   CHOL 210 (H) 12/28/2018   Lab Results  Component Value Date   HDL 53.40 12/28/2018   Lab Results  Component Value Date   LDLCALC 92 08/19/2008   Lab Results  Component Value Date   TRIG 256.0 (H) 12/28/2018   Lab Results  Component Value Date   CHOLHDL 4  12/28/2018   No results found for: HGBA1C    Assessment & Plan:   Transient episode last week of binocular diplopia.  He had full ophthalmologic eye exam with no clear problem found.  He has not had any other concerning symptoms such as headache, recurrent symptoms, focal weakness, speech change, loss of vision, etc.  We explained that the differential diagnosis for binocular diplopia is broad.  No obvious difficulties with extraocular movements.  No evidence for myasthenia gravis from exam  -Check sed rate and TSH -Follow-up immediately for any recurrent symptoms or any new symptoms as above.  We gave him handout on things to watch for. -If he has any recurrent symptoms consider MR the brain and MR angiogram head and neck and possible neurology referral  No orders of the defined types were placed in this encounter.   Follow-up: No follow-ups on file.    Carolann Littler, MD

## 2019-09-29 ENCOUNTER — Other Ambulatory Visit: Payer: Self-pay

## 2019-09-29 ENCOUNTER — Encounter: Payer: Self-pay | Admitting: Family Medicine

## 2019-09-29 ENCOUNTER — Ambulatory Visit: Payer: Medicare Other | Admitting: Family Medicine

## 2019-09-29 VITALS — BP 120/64 | HR 95 | Temp 98.3°F | Wt 144.7 lb

## 2019-09-29 DIAGNOSIS — K5792 Diverticulitis of intestine, part unspecified, without perforation or abscess without bleeding: Secondary | ICD-10-CM | POA: Diagnosis not present

## 2019-09-29 DIAGNOSIS — H6122 Impacted cerumen, left ear: Secondary | ICD-10-CM

## 2019-09-29 MED ORDER — METRONIDAZOLE 500 MG PO TABS: 500.0000 mg | ORAL_TABLET | Freq: Three times a day (TID) | ORAL | 0 refills | Status: DC

## 2019-09-29 MED ORDER — CIPROFLOXACIN HCL 500 MG PO TABS: 500.0000 mg | ORAL_TABLET | Freq: Two times a day (BID) | ORAL | 0 refills | Status: DC

## 2019-09-29 NOTE — Progress Notes (Signed)
Established Patient Office Visit  Subjective:  Patient ID: Timothy Garrett, male    DOB: May 22, 1931  Age: 86 y.o. MRN: [id number]  CC:  Chief Complaint  Patient presents with  . Abdominal Pain    since last noght believes it diverticulitis     HPI Timothy Garrett presents for recurrent pain left lower quadrant.  Back in May he had acute diverticulitis flare.  He was initially placed on 7 days of Cipro and Flagyl and was improving but had relapse.  He was then subsequently treated for 14 days and symptoms resolved.  He has had multiple flareups of diverticulitis over the years.  Usually has a couple per year roughly.  He is not interested in Diplomatic Services operational officer.  He had CT scan May 24 which showed diverticulosis but no diverticulitis changes.  He had already been on 2 weeks of antibiotics at that time.  He denies any fever.  No nausea or vomiting.  Appetite stable.  Had some recent constipation but those symptoms have resolved.  No bloody stools.  Location of pain is left lower quadrant without radiation.  No dysuria.  He states his symptoms are exactly typical of his previous acute diverticulitis flares.  Other issue is chronic hearing loss.  He has especially difficult time hearing left ear.  History of cerumen impactions in the past.  No ear pain  Past Medical History:  Diagnosis Date  . Arthritis   . Diverticulitis   . Diverticulitis   . GERD (gastroesophageal reflux disease)    Heartburn  . Hyperlipemia   . Hypertension   . IBS (irritable bowel syndrome)   . Kidney stones     Past Surgical History:  Procedure Laterality Date  . BACK SURGERY    . EYE SURGERY  2016   cataract surgery  . TONSILLECTOMY  1941  . ulcer surgery      Family History  Problem Relation Age of Onset  . Stomach cancer Mother   . Hypertension Mother   . Heart disease Father   . Heart attack Father   . Heart attack Sister   . Stroke Maternal Grandfather   . Heart attack Paternal Grandfather      Social History   Socioeconomic History  . Marital status: Married    Spouse name: Not on file  . Number of children: 2  . Years of education: Not on file  . Highest education level: Not on file  Occupational History  . Occupation: Medical illustrator    Comment: Retired  Tobacco Use  . Smoking status: Former Research scientist (life sciences)  . Smokeless tobacco: Never Used  . Tobacco comment: Quit 60 years ago  Vaping Use  . Vaping Use: Never used  Substance and Sexual Activity  . Alcohol use: No  . Drug use: No  . Sexual activity: Not Currently  Other Topics Concern  . Not on file  Social History Narrative   Lives with wife on one level condo, recently downsized   Has two children, local, 4 grandchildren   Has one dog, Sam, enjoys walking it, but no other regular exercise   Enjoys doing Haematologist, Event organiser   Social Determinants of Radio broadcast assistant Strain:   . Difficulty of Paying Living Expenses:   Food Insecurity:   . Worried About Charity fundraiser in the Last Year:   . Arboriculturist in the Last Year:   Transportation Needs:   . Film/video editor (Medical):   Marland Kitchen  Lack of Transportation (Non-Medical):   Physical Activity:   . Days of Exercise per Week:   . Minutes of Exercise per Session:   Stress:   . Feeling of Stress :   Social Connections:   . Frequency of Communication with Friends and Family:   . Frequency of Social Gatherings with Friends and Family:   . Attends Religious Services:   . Active Member of Clubs or Organizations:   . Attends Archivist Meetings:   Marland Kitchen Marital Status:   Intimate Partner Violence:   . Fear of Current or Ex-Partner:   . Emotionally Abused:   Marland Kitchen Physically Abused:   . Sexually Abused:     Outpatient Medications Prior to Visit  Medication Sig Dispense Refill  . acetaminophen (TYLENOL) 500 MG tablet Take 1,000 mg by mouth every 6 (six) hours as needed (for pain).    Marland Kitchen aspirin 81 MG tablet Take 81 mg by mouth at bedtime.      Marland Kitchen atorvastatin (LIPITOR) 20 MG tablet TAKE 0.5 TABLET BY MOUTH AT BEDTIME 45 tablet 2  . Cholecalciferol (VITAMIN D) 50 MCG (2000 UT) CAPS Take 1 capsule by mouth daily.    Marland Kitchen dicyclomine (BENTYL) 20 MG tablet Take 20 mg by mouth two times a day- morning and night 180 tablet 1  . gabapentin (NEURONTIN) 100 MG capsule TAKE 2 CAPSULES BY MOUTH IN THE MORNING AND 3 CAPSULES AT BEDTIME 150 capsule 5  . levothyroxine (SYNTHROID) 50 MCG tablet TAKE 1 TABLET(50 MCG) BY MOUTH DAILY 90 tablet 0  . lisinopril (ZESTRIL) 20 MG tablet TAKE 1 TABLET(20 MG) BY MOUTH AT BEDTIME 90 tablet 1  . LORazepam (ATIVAN) 1 MG tablet Take 0.5-1 tablets (0.5-1 mg total) by mouth at bedtime. 30 tablet 2  . Multiple Vitamins-Minerals (CENTRUM SILVER 50+MEN) TABS Take 1 tablet by mouth daily with breakfast.    . pantoprazole (PROTONIX) 40 MG tablet TAKE 1 TABLET BY MOUTH EVERY DAY 90 tablet 0  . tamsulosin (FLOMAX) 0.4 MG CAPS capsule TAKE 1 CAPSULE BY MOUTH AT BEDTIME 90 capsule 3  . ciprofloxacin (CIPRO) 500 MG tablet Take 1 tablet (500 mg total) by mouth 2 (two) times daily. 28 tablet 0  . metroNIDAZOLE (FLAGYL) 500 MG tablet Take 1 tablet (500 mg total) by mouth 3 (three) times daily. DO NOT CONSUME ALCOHOL WHILE TAKING THIS MEDICATION. 42 tablet 0   No facility-administered medications prior to visit.    Allergies  Allergen Reactions  . Sulfamethoxazole-Trimethoprim Itching    ROS Review of Systems  Constitutional: Negative for chills and fever.  HENT: Positive for hearing loss.   Respiratory: Negative for shortness of breath.   Cardiovascular: Negative for chest pain.  Gastrointestinal: Positive for abdominal pain. Negative for abdominal distention, blood in stool, diarrhea, nausea and vomiting.  Genitourinary: Negative for dysuria and hematuria.  Neurological: Negative for dizziness.      Objective:    Physical Exam Vitals reviewed.  Constitutional:      General: He is not in acute distress.     Appearance: He is well-developed. He is not ill-appearing or toxic-appearing.  HENT:     Ears:     Comments: Cerumen impaction left canal.  Removed with curette without difficulty. Cardiovascular:     Rate and Rhythm: Normal rate and regular rhythm.  Pulmonary:     Effort: Pulmonary effort is normal.     Breath sounds: Normal breath sounds.  Abdominal:     General: Bowel sounds are normal. There is  no distension.     Tenderness: There is abdominal tenderness in the left lower quadrant. There is no guarding or rebound.     Comments: Soft and tender left lower quadrant to deep palpation.  No guarding or rebound  Neurological:     Mental Status: He is alert.     BP 120/64 (BP Location: Left Arm, Patient Position: Sitting, Cuff Size: Normal)   Pulse 95   Temp 98.3 F (36.8 C) (Oral)   Wt 144 lb 11.2 oz (65.6 kg)   SpO2 98%   BMI 23.36 kg/m  Wt Readings from Last 3 Encounters:  09/29/19 144 lb 11.2 oz (65.6 kg)  08/04/19 148 lb 14.4 oz (67.5 kg)  06/24/19 152 lb 3.2 oz (69 kg)     Health Maintenance Due  Topic Date Due  . TETANUS/TDAP  08/20/2018  . INFLUENZA VACCINE  09/12/2019    There are no preventive care reminders to display for this patient.  Lab Results  Component Value Date   TSH 1.32 08/04/2019   Lab Results  Component Value Date   WBC 8.2 06/24/2019   HGB 13.6 06/24/2019   HCT 40.5 06/24/2019   MCV 102.5 (H) 06/24/2019   PLT 278.0 06/24/2019   Lab Results  Component Value Date   NA 141 06/24/2019   K 4.9 06/24/2019   CO2 28 06/24/2019   GLUCOSE 100 (H) 06/24/2019   BUN 15 06/24/2019   CREATININE 1.23 06/24/2019   BILITOT 0.5 12/28/2018   ALKPHOS 66 12/28/2018   AST 17 12/28/2018   ALT 22 12/28/2018   PROT 6.7 12/28/2018   ALBUMIN 4.5 12/28/2018   CALCIUM 9.7 06/24/2019   ANIONGAP 9 04/28/2017   GFR 55.86 (L) 06/24/2019   Lab Results  Component Value Date   CHOL 210 (H) 12/28/2018   Lab Results  Component Value Date   HDL 53.40  12/28/2018   Lab Results  Component Value Date   LDLCALC 92 08/19/2008   Lab Results  Component Value Date   TRIG 256.0 (H) 12/28/2018   Lab Results  Component Value Date   CHOLHDL 4 12/28/2018   No results found for: HGBA1C    Assessment & Plan:   Problem List Items Addressed This Visit    None    Visit Diagnoses    Acute diverticulitis    -  Primary   Relevant Medications   metroNIDAZOLE (FLAGYL) 500 MG tablet   ciprofloxacin (CIPRO) 500 MG tablet   Hearing loss due to cerumen impaction, left        Patient presents with symptoms typical of previous flares of acute diverticulitis.  He is nontoxic in appearance and no acute abdominal findings.  We will go ahead and cover with Flagyl and Cipro.  Follow-up promptly for any fever, vomiting, or other concerns.  Touch base if symptoms not resolving over the next week  He has cerumen impaction left canal.  Chronic hearing loss at baseline.  We discussed risk of removal including risk of bleeding, pain, perforation.  Patient consented.  Removed with curette without difficulty.  Eardrum shows no acute changes.  Meds ordered this encounter  Medications  . metroNIDAZOLE (FLAGYL) 500 MG tablet    Sig: Take 1 tablet (500 mg total) by mouth 3 (three) times daily. DO NOT CONSUME ALCOHOL WHILE TAKING THIS MEDICATION.    Dispense:  30 tablet    Refill:  0  . ciprofloxacin (CIPRO) 500 MG tablet    Sig: Take 1 tablet (500 mg  total) by mouth 2 (two) times daily.    Dispense:  20 tablet    Refill:  0    Follow-up: No follow-ups on file.    Carolann Littler, MD

## 2019-09-29 NOTE — Patient Instructions (Signed)
Diverticulitis  Diverticulitis is infection or inflammation of small pouches (diverticula) in the colon that form due to a condition called diverticulosis. Diverticula can trap stool (feces) and bacteria, causing infection and inflammation. Diverticulitis may cause severe stomach pain and diarrhea. It may lead to tissue damage in the colon that causes bleeding. The diverticula may also burst (rupture) and cause infected stool to enter other areas of the abdomen. Complications of diverticulitis can include:  Bleeding.  Severe infection.  Severe pain.  Rupture (perforation) of the colon.  Blockage (obstruction) of the colon. What are the causes? This condition is caused by stool becoming trapped in the diverticula, which allows bacteria to grow in the diverticula. This leads to inflammation and infection. What increases the risk? You are more likely to develop this condition if:  You have diverticulosis. The risk for diverticulosis increases if: ? You are overweight or obese. ? You use tobacco products. ? You do not get enough exercise.  You eat a diet that does not include enough fiber. High-fiber foods include fruits, vegetables, beans, nuts, and whole grains. What are the signs or symptoms? Symptoms of this condition may include:  Pain and tenderness in the abdomen. The pain is normally located on the left side of the abdomen, but it may occur in other areas.  Fever and chills.  Bloating.  Cramping.  Nausea.  Vomiting.  Changes in bowel routines.  Blood in your stool. How is this diagnosed? This condition is diagnosed based on:  Your medical history.  A physical exam.  Tests to make sure there is nothing else causing your condition. These tests may include: ? Blood tests. ? Urine tests. ? Imaging tests of the abdomen, including X-rays, ultrasounds, MRIs, or CT scans. How is this treated? Most cases of this condition are mild and can be treated at home.  Treatment may include:  Taking over-the-counter pain medicines.  Following a clear liquid diet.  Taking antibiotic medicines by mouth.  Rest. More severe cases may need to be treated at a hospital. Treatment may include:  Not eating or drinking.  Taking prescription pain medicine.  Receiving antibiotic medicines through an IV tube.  Receiving fluids and nutrition through an IV tube.  Surgery. When your condition is under control, your health care provider may recommend that you have a colonoscopy. This is an exam to look at the entire large intestine. During the exam, a lubricated, bendable tube is inserted into the anus and then passed into the rectum, colon, and other parts of the large intestine. A colonoscopy can show how severe your diverticula are and whether something else may be causing your symptoms. Follow these instructions at home: Medicines  Take over-the-counter and prescription medicines only as told by your health care provider. These include fiber supplements, probiotics, and stool softeners.  If you were prescribed an antibiotic medicine, take it as told by your health care provider. Do not stop taking the antibiotic even if you start to feel better.  Do not drive or use heavy machinery while taking prescription pain medicine. General instructions   Follow a full liquid diet or another diet as directed by your health care provider. After your symptoms improve, your health care provider may tell you to change your diet. He or she may recommend that you eat a diet that contains at least 25 g (25 grams) of fiber daily. Fiber makes it easier to pass stool. Healthy sources of fiber include: ? Berries. One cup contains 4-8 grams of   fiber. ? Beans or lentils. One half cup contains 5-8 grams of fiber. ? Green vegetables. One cup contains 4 grams of fiber.  Exercise for at least 30 minutes, 3 times each week. You should exercise hard enough to raise your heart rate and  break a sweat.  Keep all follow-up visits as told by your health care provider. This is important. You may need a colonoscopy. Contact a health care provider if:  Your pain does not improve.  You have a hard time drinking or eating food.  Your bowel movements do not return to normal. Get help right away if:  Your pain gets worse.  Your symptoms do not get better with treatment.  Your symptoms suddenly get worse.  You have a fever.  You vomit more than one time.  You have stools that are bloody, black, or tarry. Summary  Diverticulitis is infection or inflammation of small pouches (diverticula) in the colon that form due to a condition called diverticulosis. Diverticula can trap stool (feces) and bacteria, causing infection and inflammation.  You are at higher risk for this condition if you have diverticulosis and you eat a diet that does not include enough fiber.  Most cases of this condition are mild and can be treated at home. More severe cases may need to be treated at a hospital.  When your condition is under control, your health care provider may recommend that you have an exam called a colonoscopy. This exam can show how severe your diverticula are and whether something else may be causing your symptoms. This information is not intended to replace advice given to you by your health care provider. Make sure you discuss any questions you have with your health care provider. Document Revised: 01/10/2017 Document Reviewed: 03/02/2016 Elsevier Patient Education  2020 [employer]  

## 2019-10-06 ENCOUNTER — Other Ambulatory Visit: Payer: Self-pay | Admitting: Family Medicine

## 2019-10-29 ENCOUNTER — Other Ambulatory Visit: Payer: Self-pay | Admitting: Family Medicine

## 2019-10-29 DIAGNOSIS — I10 Essential (primary) hypertension: Secondary | ICD-10-CM

## 2019-10-29 DIAGNOSIS — E039 Hypothyroidism, unspecified: Secondary | ICD-10-CM

## 2019-11-02 ENCOUNTER — Telehealth: Payer: Self-pay | Admitting: Family Medicine

## 2019-11-02 NOTE — Progress Notes (Signed)
  Chronic Care Management   Note  11/02/2019 Name: Jylan Loeza MRN: [id number] DOB: 08-Dec-1931  Schylar Wuebker is a 86 y.o. year old male who is a primary care patient of Burchette, Alinda Sierras, MD. I reached out to Bernardo Heater by phone today in response to a referral sent by Mr. Kenyatta Keidel Pemberton's PCP, Eulas Post, MD.   Mr. Barefoot was given information about Chronic Care Management services today including:  1. CCM service includes personalized support from designated clinical staff supervised by his physician, including individualized plan of care and coordination with other care providers 2. 24/7 contact phone numbers for assistance for urgent and routine care needs. 3. Service will only be billed when office clinical staff spend 20 minutes or more in a month to coordinate care. 4. Only one practitioner may furnish and bill the service in a calendar month. 5. The patient may stop CCM services at any time (effective at the end of the month) by phone call to the office staff.   Patient agreed to services and verbal consent obtained.   Follow up plan:   Carley Perdue [employer] Scheduler

## 2019-11-08 DIAGNOSIS — H26493 Other secondary cataract, bilateral: Secondary | ICD-10-CM | POA: Diagnosis not present

## 2019-11-08 DIAGNOSIS — H353121 Nonexudative age-related macular degeneration, left eye, early dry stage: Secondary | ICD-10-CM | POA: Diagnosis not present

## 2019-11-08 DIAGNOSIS — H43813 Vitreous degeneration, bilateral: Secondary | ICD-10-CM | POA: Diagnosis not present

## 2019-11-08 DIAGNOSIS — H532 Diplopia: Secondary | ICD-10-CM | POA: Diagnosis not present

## 2019-12-20 ENCOUNTER — Other Ambulatory Visit: Payer: Self-pay | Admitting: Family Medicine

## 2019-12-20 ENCOUNTER — Ambulatory Visit: Payer: Medicare Other

## 2019-12-20 ENCOUNTER — Other Ambulatory Visit: Payer: Self-pay

## 2019-12-20 VITALS — BP 118/74 | HR 78 | Temp 98.3°F | Ht 66.0 in | Wt 142.1 lb

## 2019-12-20 DIAGNOSIS — Z0000 Encounter for general adult medical examination without abnormal findings: Secondary | ICD-10-CM

## 2019-12-20 DIAGNOSIS — F419 Anxiety disorder, unspecified: Secondary | ICD-10-CM

## 2019-12-20 MED ORDER — LORAZEPAM 1 MG PO TABS
0.5000 mg | ORAL_TABLET | Freq: Every day | ORAL | 2 refills | Status: DC
Start: 2019-12-20 — End: 2020-08-10

## 2019-12-20 NOTE — Telephone Encounter (Signed)
During medicare wellness visit. Patient requested a refill on his Lorazepam. Please advise

## 2019-12-20 NOTE — Progress Notes (Signed)
Subjective:   Timothy Garrett is a 86 y.o. male who presents for Medicare Annual/Subsequent preventive examination.  Review of Systems    N/A  Cardiac Risk Factors include: advanced age (>65men, >71 women);hypertension;dyslipidemia     Objective:    Today's Vitals   12/20/19 1406  BP: 118/74  Pulse: 78  Temp: 98.3 F (36.8 C)  TempSrc: Oral  SpO2: 97%  Weight: 142 lb 2 oz (64.5 kg)  Height: 5\' 6"  (1.676 m)   Body mass index is 22.94 kg/m.  Advanced Directives 12/20/2019 03/24/2018 04/28/2017  Does Patient Have a Medical Advance Directive? Yes Yes No  Type of Paramedic of [place];Living will [place];Living will -  Does patient want to make changes to medical advance directive? No - Patient declined No - Patient declined -  Copy of [place] in Chart? No - copy requested No - copy requested -  Would patient like information on creating a medical advance directive? - - No - Patient declined    Current Medications (verified) Outpatient Encounter Medications as of 12/20/2019  Medication Sig  . acetaminophen (TYLENOL) 500 MG tablet Take 1,000 mg by mouth every 6 (six) hours as needed (for pain).  Marland Kitchen aspirin 81 MG tablet Take 81 mg by mouth at bedtime.   Marland Kitchen atorvastatin (LIPITOR) 20 MG tablet TAKE 1/2 TABLET BY MOUTH AT BEDTIME  . Cholecalciferol (VITAMIN D) 50 MCG (2000 UT) CAPS Take 1 capsule by mouth daily.  Marland Kitchen dicyclomine (BENTYL) 20 MG tablet Take 20 mg by mouth two times a day- morning and night  . gabapentin (NEURONTIN) 100 MG capsule TAKE 2 CAPSULES BY MOUTH IN THE MORNING AND 3 CAPSULES AT BEDTIME  . levothyroxine (SYNTHROID) 50 MCG tablet TAKE 1 TABLET(50 MCG) BY MOUTH DAILY  . lisinopril (ZESTRIL) 20 MG tablet TAKE 1 TABLET(20 MG) BY MOUTH AT BEDTIME  . LORazepam (ATIVAN) 1 MG tablet Take 0.5-1 tablets (0.5-1 mg total) by mouth at bedtime.  . metroNIDAZOLE (FLAGYL) 500 MG tablet Take 1 tablet (500 mg  total) by mouth 3 (three) times daily. DO NOT CONSUME ALCOHOL WHILE TAKING THIS MEDICATION.  . Multiple Vitamins-Minerals (CENTRUM SILVER 50+MEN) TABS Take 1 tablet by mouth daily with breakfast.  . pantoprazole (PROTONIX) 40 MG tablet TAKE 1 TABLET BY MOUTH EVERY DAY  . tamsulosin (FLOMAX) 0.4 MG CAPS capsule TAKE 1 CAPSULE BY MOUTH AT BEDTIME  . [DISCONTINUED] ciprofloxacin (CIPRO) 500 MG tablet Take 1 tablet (500 mg total) by mouth 2 (two) times daily.   No facility-administered encounter medications on file as of 12/20/2019.    Allergies (verified) Sulfamethoxazole-trimethoprim   History: Past Medical History:  Diagnosis Date  . Arthritis   . Diverticulitis   . Diverticulitis   . GERD (gastroesophageal reflux disease)    Heartburn  . Hyperlipemia   . Hypertension   . IBS (irritable bowel syndrome)   . Kidney stones    Past Surgical History:  Procedure Laterality Date  . BACK SURGERY    . EYE SURGERY  2016   cataract surgery  . TONSILLECTOMY  1941  . ulcer surgery     Family History  Problem Relation Age of Onset  . Stomach cancer Mother   . Hypertension Mother   . Heart disease Father   . Heart attack Father   . Heart attack Sister   . Stroke Maternal Grandfather   . Heart attack Paternal Grandfather    Social History   Socioeconomic History  .  Marital status: Married    Spouse name: Not on file  . Number of children: 2  . Years of education: Not on file  . Highest education level: Not on file  Occupational History  . Occupation: Medical illustrator    Comment: Retired  Tobacco Use  . Smoking status: Former Research scientist (life sciences)  . Smokeless tobacco: Never Used  . Tobacco comment: Quit 60 years ago  Vaping Use  . Vaping Use: Never used  Substance and Sexual Activity  . Alcohol use: No  . Drug use: No  . Sexual activity: Not Currently  Other Topics Concern  . Not on file  Social History Narrative   Lives with wife on one level condo, recently downsized   Has two  children, local, 4 grandchildren   Has one dog, Sam, enjoys walking it, but no other regular exercise   Enjoys doing Haematologist, Event organiser   Social Determinants of Health   Financial Resource Strain: Low Risk   . Difficulty of Paying Living Expenses: Not hard at all  Food Insecurity: No Food Insecurity  . Worried About Charity fundraiser in the Last Year: Never true  . Ran Out of Food in the Last Year: Never true  Transportation Needs: No Transportation Needs  . Lack of Transportation (Medical): No  . Lack of Transportation (Non-Medical): No  Physical Activity: Inactive  . Days of Exercise per Week: 0 days  . Minutes of Exercise per Session: 0 min  Stress: No Stress Concern Present  . Feeling of Stress : Not at all  Social Connections: Moderately Integrated  . Frequency of Communication with Friends and Family: More than three times a week  . Frequency of Social Gatherings with Friends and Family: Three times a week  . Attends Religious Services: More than 4 times per year  . Active Member of Clubs or Organizations: No  . Attends Archivist Meetings: Never  . Marital Status: Married    Tobacco Counseling Counseling given: Not Answered Comment: Quit 60 years ago   Clinical Intake:  Pre-visit preparation completed: Yes  Pain : No/denies pain     Nutritional Risks: None Diabetes: No  How often do you need to have someone help you when you read instructions, pamphlets, or other written materials from your doctor or pharmacy?: 1 - Never What is the last grade level you completed in school?: 12th grade  Diabetic?No  Interpreter Needed?: No  Information entered by :: [facility] Daily Living In your present state of health, do you have any difficulty performing the following activities: 12/20/2019  Hearing? 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 N  Difficulty concentrating or  making decisions? N  Walking or climbing stairs? N  Dressing or bathing? N  Doing errands, shopping? N  Preparing Food and eating ? N  Using the Toilet? N  In the past six months, have you accidently leaked urine? N  Do you have problems with loss of bowel control? N  Managing your Medications? N  Managing your Finances? N  Housekeeping or managing your Housekeeping? N  Some recent data might be hidden    Patient Care Team: Eulas Post, MD as PCP - General (Family Medicine) Loletha Carrow Kirke Corin, MD as Consulting Physician (Gastroenterology) Calvert Cantor, MD as Consulting Physician (Ophthalmology) Viona Gilmore, [facility] as Pharmacist (Pharmacist)  Indicate any recent Medical Services you may have received from  other than [facility] providers in the past year (date may be approximate).     Assessment:   This is a routine wellness examination for Timothy Garrett.  Hearing/Vision screen  Hearing Screening   125Hz  250Hz  500Hz  1000Hz  2000Hz  3000Hz  4000Hz  6000Hz  8000Hz   Right ear:           Left ear:           Vision Screening Comments: Patient states gets eyes examined once per year   Dietary issues and exercise activities discussed: Current Exercise Habits: The patient does not participate in regular exercise at present  Goals    . Patient Stated     Maintain independence!       Depression Screen PHQ 2/9 Scores 12/20/2019 04/26/2019 03/24/2018 02/24/2018  PHQ - 2 Score 0 0 0 0  PHQ- 9 Score 0 - 0 -    Fall Risk Fall Risk  12/20/2019 08/04/2019 03/24/2018 02/24/2018  Falls in the past year? 0 0 1 0  Number falls in past yr: 0 0 - -  Injury with Fall? 0 0 1 -  Comment - - sprained shoulder, but ok now -  Risk for fall due to : Impaired balance/gait - History of fall(s) -  Risk for fall due to: Comment - - tripped by dog -  Follow up Falls evaluation completed;Falls prevention discussed Falls evaluation completed Falls prevention discussed;Education provided -    Any stairs in or  around the home? No  If so, are there any without handrails? No  Home free of loose throw rugs in walkways, pet beds, electrical cords, etc? Yes  Adequate lighting in your home to reduce risk of falls? Yes   ASSISTIVE DEVICES UTILIZED TO PREVENT FALLS:  Life alert? No  Use of a cane, walker or w/c? No  Grab bars in the bathroom? Yes  Shower chair or bench in shower? Yes  Elevated toilet seat or a handicapped toilet? No   TIMED UP AND GO:  Was the test performed? Yes .  Length of time to ambulate 10 feet: 8 sec.   Gait unsteady without use of assistive device, provider informed and interventions were implemented  Cognitive Function: Cognitive screening not indicated based on direct observation. MMSE - Mini Mental State Exam 03/24/2018  Orientation to time 5  Orientation to Place 5  Registration 3  Attention/ Calculation 4  Recall 3  Language- name 2 objects 1  Language- repeat 1  Language- follow 3 step command 3  Language- read & follow direction 1  Write a sentence 1  Copy design 1  Total score 28        Immunizations Immunization History  Administered Date(s) Administered  . Fluad Quad(high Dose 65+) 10/22/2018  . Influenza Split 11/16/2010  . Influenza Whole 11/13/2006, 11/17/2006, 11/13/2007, 11/23/2009  . Influenza, High Dose Seasonal PF 10/27/2015, 11/18/2016  . Influenza, Seasonal, Injecte, Preservative Fre 11/25/2014  . Influenza,inj,Quad PF,6+ Mos 11/27/2017  . Influenza,trivalent, recombinat, inj, PF 11/10/2013  . PFIZER SARS-COV-2 Vaccination 03/19/2019, 04/13/2019  . Pneumococcal Conjugate-13 07/06/2014  . Pneumococcal Polysaccharide-23 02/21/1999  . Pneumococcal-Unspecified 02/21/1999  . Td 02/11/1997, 08/19/2008  . Tdap 08/11/2008    TDAP status: Due, Education has been provided regarding the importance of this vaccine. Advised may receive this vaccine at local pharmacy or Health Dept. Aware to provide a copy of the vaccination record if obtained  from local pharmacy or Health Dept. Verbalized acceptance and understanding. Flu Vaccine status: Up to date Pneumococcal vaccine status: Up to  date Covid-19 vaccine status: Completed vaccines  Qualifies for Shingles Vaccine? Yes   Zostavax completed No   Shingrix Completed?: No.    Education has been provided regarding the importance of this vaccine. Patient has been advised to call insurance company to determine out of pocket expense if they have not yet received this vaccine. Advised may also receive vaccine at local pharmacy or Health Dept. Verbalized acceptance and understanding.  Screening Tests Health Maintenance  Topic Date Due  . TETANUS/TDAP  08/20/2018  . INFLUENZA VACCINE  09/12/2019  . COVID-19 Vaccine  Completed  . PNA vac Low Risk Adult  Completed    Health Maintenance  Health Maintenance Due  Topic Date Due  . TETANUS/TDAP  08/20/2018  . INFLUENZA VACCINE  09/12/2019    Colorectal cancer screening: No longer required.   Lung Cancer Screening: (Low Dose CT Chest recommended if Age 48-80 years, 30 pack-year currently smoking OR have quit w/in 15years.) does not qualify.   Lung Cancer Screening Referral: N/A   Additional Screening:  Hepatitis C Screening: does not qualify;   Vision Screening: Recommended annual ophthalmology exams for early detection of glaucoma and other disorders of the eye. Is the patient up to date with their annual eye exam?  Yes  Who is the provider or what is the name of the office in which the patient attends annual eye exams? Dr. Ina Kick If pt is not established with a provider, would they like to be referred to a provider to establish care? No .   Dental Screening: Recommended annual dental exams for proper oral hygiene  Community Resource Referral / Chronic Care Management: CRR required this visit?  No   CCM required this visit?  No      Plan:     I have personally reviewed and noted the following in the patient's chart:    . Medical and social history . Use of alcohol, tobacco or illicit drugs  . Current medications and supplements . Functional ability and status . Nutritional status . Physical activity . Advanced directives . List of other physicians . Hospitalizations, surgeries, and ER visits in previous 12 months . Vitals . Screenings to include cognitive, depression, and falls . Referrals and appointments  In addition, I have reviewed and discussed with patient certain preventive protocols, quality metrics, and best practice recommendations. A written personalized care plan for preventive services as well as general preventive health recommendations were provided to patient.     Ofilia Neas, LPN   75/02/256   Nurse Notes: None

## 2019-12-20 NOTE — Patient Instructions (Signed)
Timothy Garrett , Thank you for taking time to come for your Medicare Wellness Visit. I appreciate your ongoing commitment to your health goals. Please review the following plan we discussed and let me know if I can assist you in the future.   Screening recommendations/referrals: Colonoscopy: No longer required  Recommended yearly ophthalmology/optometry visit for glaucoma screening and checkup Recommended yearly dental visit for hygiene and checkup  Vaccinations: Influenza vaccine: Up to date, next due fall 2022  Pneumococcal vaccine: Completed series  Tdap vaccine: Currently due, you may receive at your next office visit if you wish or you may await and injury to receive as it will be covered  Shingles vaccine: Currently due for Shingrix, we recommend that you receive this at your pharmacy as it will be less expensive  Advanced directives: Please bring a copy of your advanced medical directives into our office so that we may scan them into your chart.  Conditions/risks identified: None   Next appointment: 12/31/2019 @ 9:00 am with Pharmacist   Preventive Care 84 Years and Older, Male Preventive care refers to lifestyle choices and visits with your health care provider that can promote health and wellness. What does preventive care include?  A yearly physical exam. This is also called an annual well check.  Dental exams once or twice a year.  Routine eye exams. Ask your health care provider how often you should have your eyes checked.  Personal lifestyle choices, including:  Daily care of your teeth and gums.  Regular physical activity.  Eating a healthy diet.  Avoiding tobacco and drug use.  Limiting alcohol use.  Practicing safe sex.  Taking low doses of aspirin every day.  Taking vitamin and mineral supplements as recommended by your health care provider. What happens during an annual well check? The services and screenings done by your health care provider during your  annual well check will depend on your age, overall health, lifestyle risk factors, and family history of disease. Counseling  Your health care provider may ask you questions about your:  Alcohol use.  Tobacco use.  Drug use.  Emotional well-being.  Home and relationship well-being.  Sexual activity.  Eating habits.  History of falls.  Memory and ability to understand (cognition).  Work and work Statistician. Screening  You may have the following tests or measurements:  Height, weight, and BMI.  Blood pressure.  Lipid and cholesterol levels. These may be checked every 5 years, or more frequently if you are over 84 years old.  Skin check.  Lung cancer screening. You may have this screening every year starting at age 84 if you have a 30-pack-year history of smoking and currently smoke or have quit within the past 15 years.  Fecal occult blood test (FOBT) of the stool. You may have this test every year starting at age 84.  Flexible sigmoidoscopy or colonoscopy. You may have a sigmoidoscopy every 5 years or a colonoscopy every 10 years starting at age 84.  Prostate cancer screening. Recommendations will vary depending on your family history and other risks.  Hepatitis C blood test.  Hepatitis B blood test.  Sexually transmitted disease (STD) testing.  Diabetes screening. This is done by checking your blood sugar (glucose) after you have not eaten for a while (fasting). You may have this done every 1-3 years.  Abdominal aortic aneurysm (AAA) screening. You may need this if you are a current or former smoker.  Osteoporosis. You may be screened starting at age 52 if  you are at high risk. Talk with your health care provider about your test results, treatment options, and if necessary, the need for more tests. Vaccines  Your health care provider may recommend certain vaccines, such as:  Influenza vaccine. This is recommended every year.  Tetanus, diphtheria, and  acellular pertussis (Tdap, Td) vaccine. You may need a Td booster every 10 years.  Zoster vaccine. You may need this after age 10.  Pneumococcal 13-valent conjugate (PCV13) vaccine. One dose is recommended after age 65.  Pneumococcal polysaccharide (PPSV23) vaccine. One dose is recommended after age 20. Talk to your health care provider about which screenings and vaccines you need and how often you need them. This information is not intended to replace advice given to you by your health care provider. Make sure you discuss any questions you have with your health care provider. Document Released: 02/24/2015 Document Revised: 10/18/2015 Document Reviewed: 11/29/2014 Elsevier Interactive Patient Education  2017 [place] Prevention in the Home Falls can cause injuries. They can happen to people of all ages. There are many things you can do to make your home safe and to help prevent falls. What can I do on the outside of my home?  Regularly fix the edges of walkways and driveways and fix any cracks.  Remove anything that might make you trip as you walk through a door, such as a raised step or threshold.  Trim any bushes or trees on the path to your home.  Use bright outdoor lighting.  Clear any walking paths of anything that might make someone trip, such as rocks or tools.  Regularly check to see if handrails are loose or broken. Make sure that both sides of any steps have handrails.  Any raised decks and porches should have guardrails on the edges.  Have any leaves, snow, or ice cleared regularly.  Use sand or salt on walking paths during winter.  Clean up any spills in your garage right away. This includes oil or grease spills. What can I do in the bathroom?  Use night lights.  Install grab bars by the toilet and in the tub and shower. Do not use towel bars as grab bars.  Use non-skid mats or decals in the tub or shower.  If you need to sit down in the shower, use  a plastic, non-slip stool.  Keep the floor dry. Clean up any water that spills on the floor as soon as it happens.  Remove soap buildup in the tub or shower regularly.  Attach bath mats securely with double-sided non-slip rug tape.  Do not have throw rugs and other things on the floor that can make you trip. What can I do in the bedroom?  Use night lights.  Make sure that you have a light by your bed that is easy to reach.  Do not use any sheets or blankets that are too big for your bed. They should not hang down onto the floor.  Have a firm chair that has side arms. You can use this for support while you get dressed.  Do not have throw rugs and other things on the floor that can make you trip. What can I do in the kitchen?  Clean up any spills right away.  Avoid walking on wet floors.  Keep items that you use a lot in easy-to-reach places.  If you need to reach something above you, use a strong step stool that has a grab bar.  Keep electrical cords  out of the way.  Do not use floor polish or wax that makes floors slippery. If you must use wax, use non-skid floor wax.  Do not have throw rugs and other things on the floor that can make you trip. What can I do with my stairs?  Do not leave any items on the stairs.  Make sure that there are handrails on both sides of the stairs and use them. Fix handrails that are broken or loose. Make sure that handrails are as long as the stairways.  Check any carpeting to make sure that it is firmly attached to the stairs. Fix any carpet that is loose or worn.  Avoid having throw rugs at the top or bottom of the stairs. If you do have throw rugs, attach them to the floor with carpet tape.  Make sure that you have a light switch at the top of the stairs and the bottom of the stairs. If you do not have them, ask someone to add them for you. What else can I do to help prevent falls?  Wear shoes that:  Do not have high heels.  Have  rubber bottoms.  Are comfortable and fit you well.  Are closed at the toe. Do not wear sandals.  If you use a stepladder:  Make sure that it is fully opened. Do not climb a closed stepladder.  Make sure that both sides of the stepladder are locked into place.  Ask someone to hold it for you, if possible.  Clearly mark and make sure that you can see:  Any grab bars or handrails.  First and last steps.  Where the edge of each step is.  Use tools that help you move around (mobility aids) if they are needed. These include:  Canes.  Walkers.  Scooters.  Crutches.  Turn on the lights when you go into a dark area. Replace any light bulbs as soon as they burn out.  Set up your furniture so you have a clear path. Avoid moving your furniture around.  If any of your floors are uneven, fix them.  If there are any pets around you, be aware of where they are.  Review your medicines with your doctor. Some medicines can make you feel dizzy. This can increase your chance of falling. Ask your doctor what other things that you can do to help prevent falls. This information is not intended to replace advice given to you by your health care provider. Make sure you discuss any questions you have with your health care provider. Document Released: 11/24/2008 Document Revised: 07/06/2015 Document Reviewed: 03/04/2014 Elsevier Interactive Patient Education  2017 [employer].

## 2019-12-31 ENCOUNTER — Ambulatory Visit: Payer: Medicare Other

## 2019-12-31 NOTE — Progress Notes (Signed)
Your information has been submitted to [employer] [place]. [employer] [place] will review the request and notify you of the determination decision directly, typically within 3 business days of your submission and once all necessary information is received.  You will also receive your request decision electronically. To check for an update later, open the request again from your dashboard.  If [employer] [place] has not responded within the specified timeframe or if you have any questions about your PA submission, contact [employer] [place] directly at (MAPD) [phone] or (PDP) [phone]. 

## 2019-12-31 NOTE — Progress Notes (Signed)
This request has been approved. [employer] [place] will send a letter to the member and you regarding this decision. Please see additional information at the bottom of this request.

## 2020-01-10 DIAGNOSIS — H524 Presbyopia: Secondary | ICD-10-CM | POA: Diagnosis not present

## 2020-01-27 ENCOUNTER — Other Ambulatory Visit: Payer: Self-pay | Admitting: Family Medicine

## 2020-01-27 DIAGNOSIS — E039 Hypothyroidism, unspecified: Secondary | ICD-10-CM

## 2020-03-24 DIAGNOSIS — H00022 Hordeolum internum right lower eyelid: Secondary | ICD-10-CM | POA: Diagnosis not present

## 2020-04-03 ENCOUNTER — Other Ambulatory Visit: Payer: Self-pay | Admitting: Family Medicine

## 2020-04-26 ENCOUNTER — Other Ambulatory Visit: Payer: Self-pay | Admitting: Family Medicine

## 2020-04-26 DIAGNOSIS — E039 Hypothyroidism, unspecified: Secondary | ICD-10-CM

## 2020-04-28 DIAGNOSIS — H00022 Hordeolum internum right lower eyelid: Secondary | ICD-10-CM | POA: Diagnosis not present

## 2020-04-28 DIAGNOSIS — H353121 Nonexudative age-related macular degeneration, left eye, early dry stage: Secondary | ICD-10-CM | POA: Diagnosis not present

## 2020-04-28 DIAGNOSIS — H0012 Chalazion right lower eyelid: Secondary | ICD-10-CM | POA: Diagnosis not present

## 2020-05-02 ENCOUNTER — Telehealth: Payer: Medicare Other | Admitting: Adult Health

## 2020-05-02 DIAGNOSIS — Z03818 Encounter for observation for suspected exposure to other biological agents ruled out: Secondary | ICD-10-CM | POA: Diagnosis not present

## 2020-05-02 DIAGNOSIS — J309 Allergic rhinitis, unspecified: Secondary | ICD-10-CM | POA: Diagnosis not present

## 2020-05-02 DIAGNOSIS — Z20822 Contact with and (suspected) exposure to covid-19: Secondary | ICD-10-CM | POA: Diagnosis not present

## 2020-05-15 DIAGNOSIS — H00022 Hordeolum internum right lower eyelid: Secondary | ICD-10-CM | POA: Diagnosis not present

## 2020-05-15 DIAGNOSIS — H0012 Chalazion right lower eyelid: Secondary | ICD-10-CM | POA: Diagnosis not present

## 2020-07-25 ENCOUNTER — Other Ambulatory Visit: Payer: Self-pay | Admitting: Family Medicine

## 2020-07-25 DIAGNOSIS — E039 Hypothyroidism, unspecified: Secondary | ICD-10-CM

## 2020-07-31 ENCOUNTER — Other Ambulatory Visit: Payer: Self-pay | Admitting: Family Medicine

## 2020-07-31 DIAGNOSIS — I10 Essential (primary) hypertension: Secondary | ICD-10-CM

## 2020-08-09 ENCOUNTER — Other Ambulatory Visit: Payer: Self-pay | Admitting: Family Medicine

## 2020-08-09 DIAGNOSIS — F419 Anxiety disorder, unspecified: Secondary | ICD-10-CM

## 2020-08-09 NOTE — Telephone Encounter (Signed)
Last filled 12/20/2019 Last OV 09/29/2019 Due for a physical  Ok to fill?

## 2020-08-10 NOTE — Telephone Encounter (Signed)
Refilled once.  Make sure he has follow up by August.

## 2020-08-13 DIAGNOSIS — H8302 Labyrinthitis, left ear: Secondary | ICD-10-CM | POA: Diagnosis not present

## 2020-09-02 DIAGNOSIS — H9202 Otalgia, left ear: Secondary | ICD-10-CM | POA: Diagnosis not present

## 2020-09-02 DIAGNOSIS — H6123 Impacted cerumen, bilateral: Secondary | ICD-10-CM | POA: Diagnosis not present

## 2020-09-15 DIAGNOSIS — Z23 Encounter for immunization: Secondary | ICD-10-CM | POA: Diagnosis not present

## 2020-09-15 DIAGNOSIS — S80811A Abrasion, right lower leg, initial encounter: Secondary | ICD-10-CM | POA: Diagnosis not present

## 2020-09-15 DIAGNOSIS — W109XXA Fall (on) (from) unspecified stairs and steps, initial encounter: Secondary | ICD-10-CM | POA: Diagnosis not present

## 2020-09-23 DIAGNOSIS — S80811D Abrasion, right lower leg, subsequent encounter: Secondary | ICD-10-CM | POA: Diagnosis not present

## 2020-09-23 DIAGNOSIS — L03115 Cellulitis of right lower limb: Secondary | ICD-10-CM | POA: Diagnosis not present

## 2020-09-23 DIAGNOSIS — W1839XD Other fall on same level, subsequent encounter: Secondary | ICD-10-CM | POA: Diagnosis not present

## 2020-09-24 IMAGING — CT CT ABD-PELV W/ CM
2 of 5 series · 13 of 46 positions shown, 15 images · IV contrast (iopamidol)
Comparison: 05/07/2007 from [HOSPITAL]

CLINICAL DATA: Abdominal pain for 2 weeks. Suspected
diverticulitis.

EXAM:
CT ABDOMEN AND PELVIS WITH CONTRAST
TECHNIQUE: Multidetector CT imaging of the abdomen and pelvis was performed
using the standard protocol following bolus administration of
intravenous contrast.
CONTRAST:  100mL 5AFQ5G-FII IOPAMIDOL (5AFQ5G-FII) INJECTION 61%

[Series 2: abd pelvis 5.00 br40 s3 axial · axial · 0.62mm/px · z∈[+1213,+1563]mm · 10 of 84 slices shown, 12 images]
[im 7/84  soft-tissue]
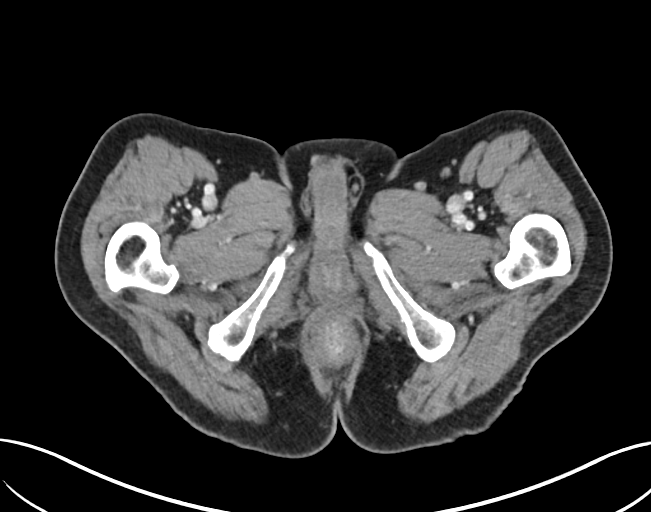
[im 7/84  bone]
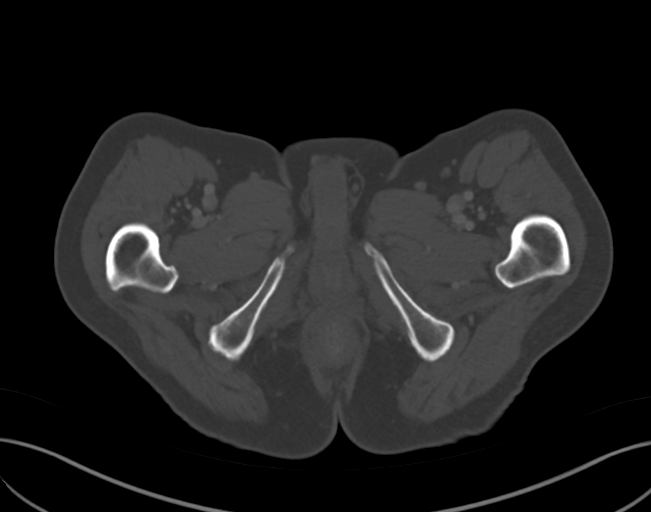
[im 13/84  soft-tissue]
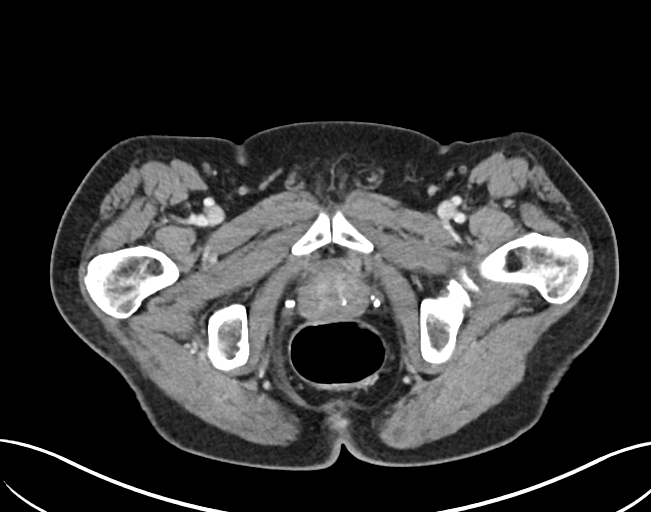
[im 26/84  soft-tissue]
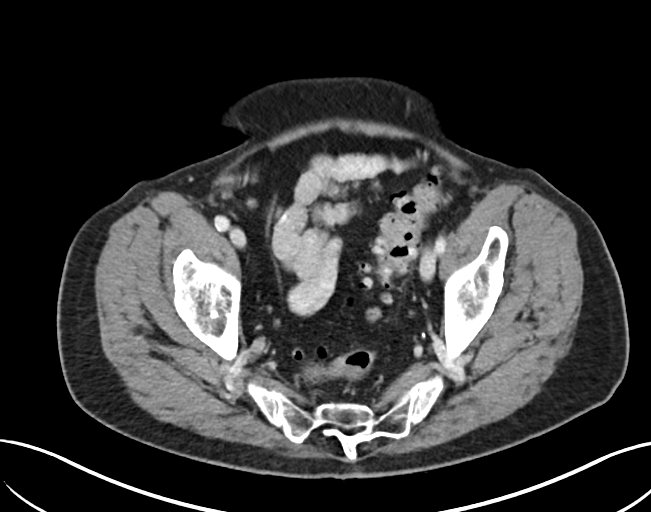
[im 32/84  soft-tissue]
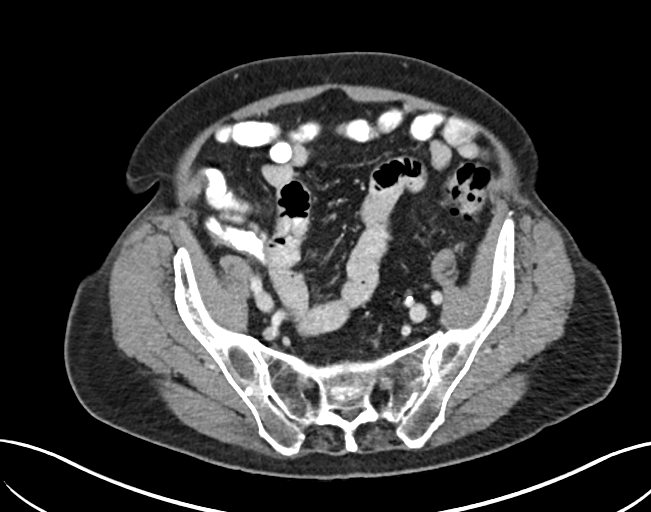
[im 39/84  soft-tissue]
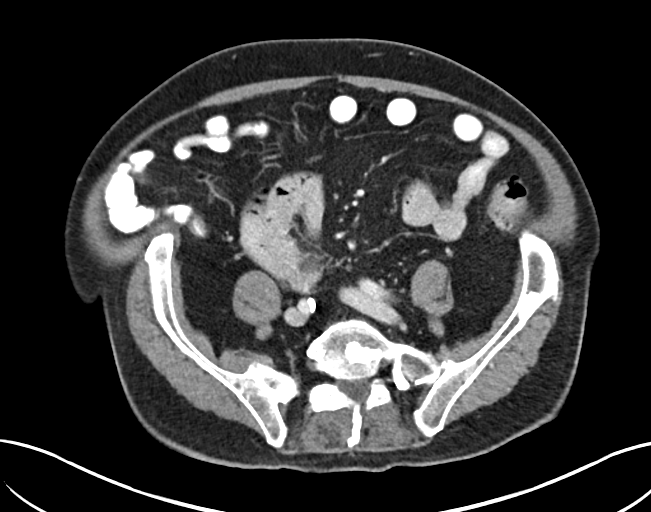
[im 45/84  soft-tissue]
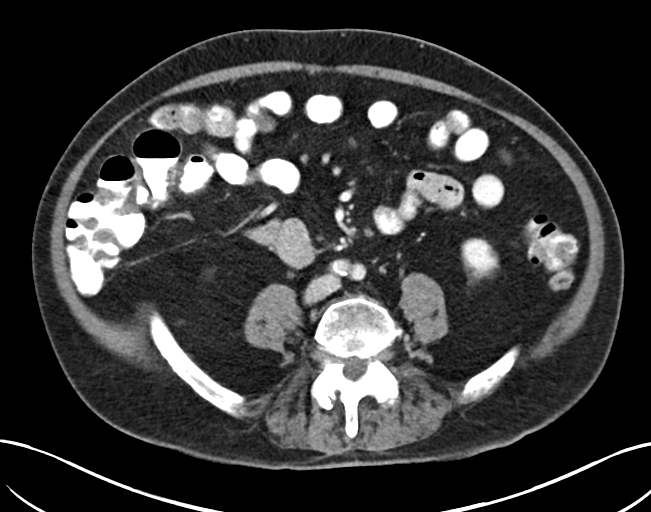
[im 52/84  soft-tissue]
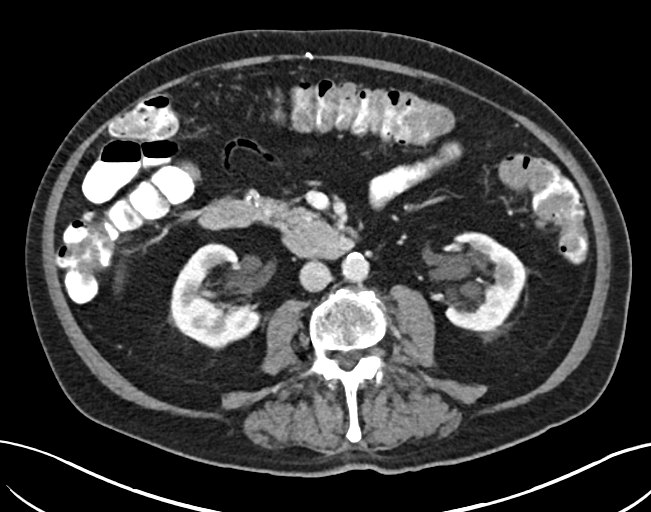
[im 64/84  soft-tissue]
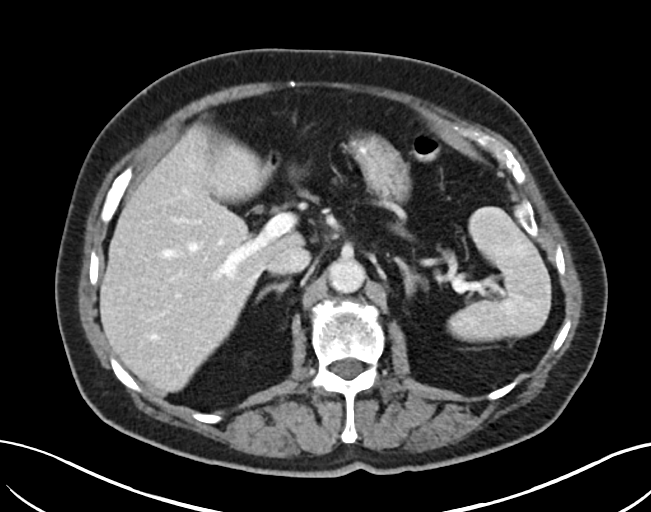
[im 71/84  soft-tissue]
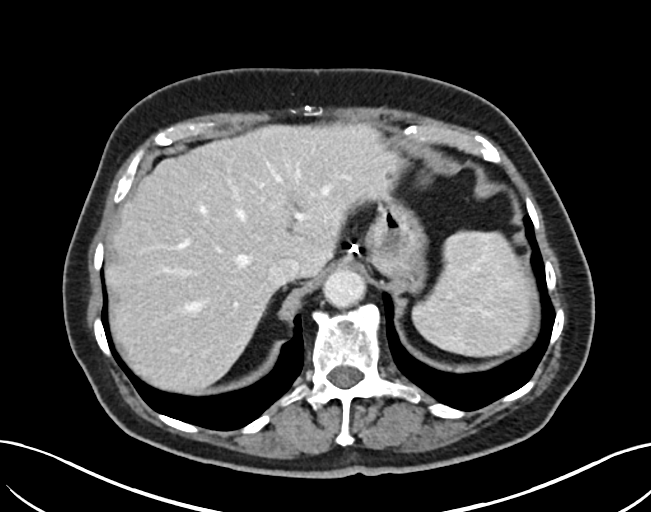
[im 71/84  bone]
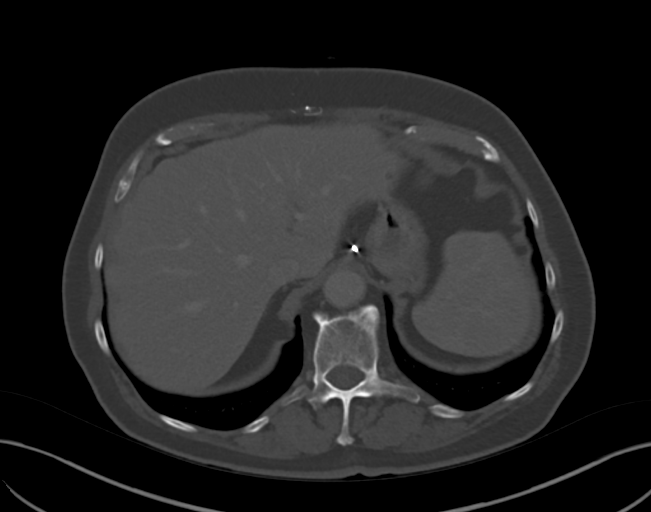
[im 77/84  soft-tissue]
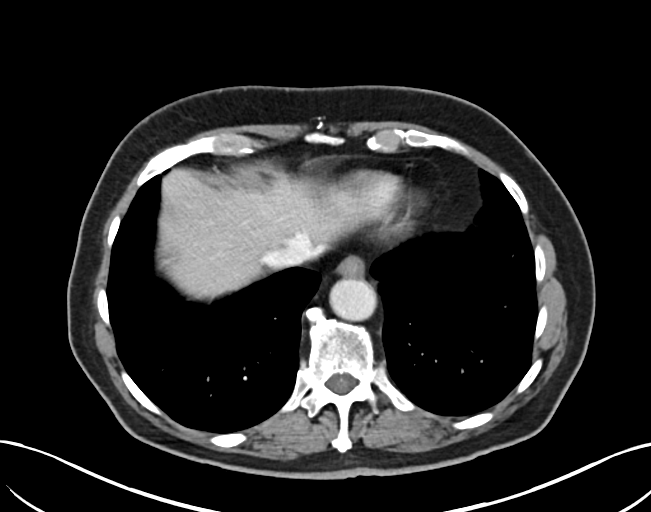

[Series 6: abd pelvis 2.00 br40 s3 cor · coronal · 0.79mm/px · 3 of 158 slices shown]
[im 53/158  soft-tissue]
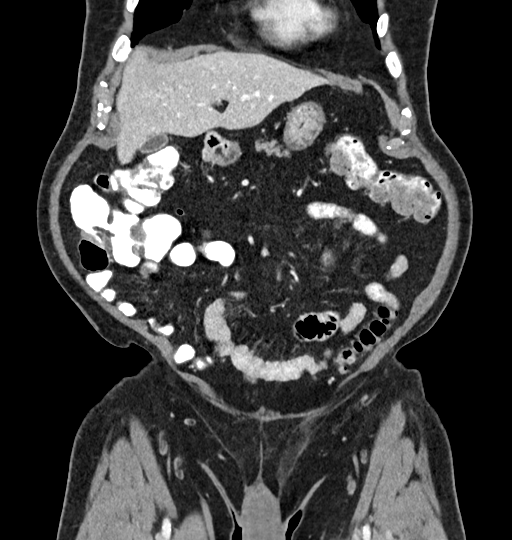
[im 70/158  soft-tissue]
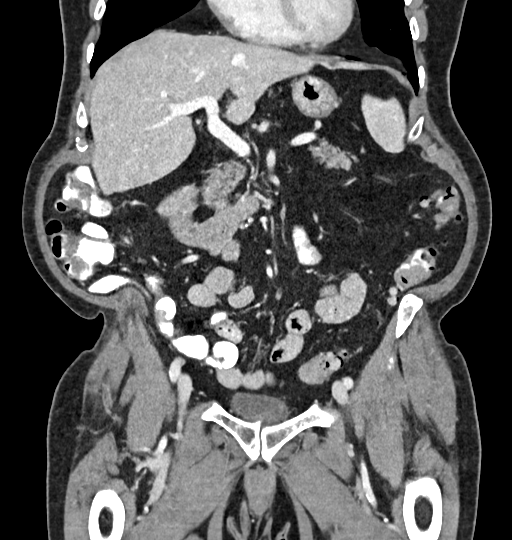
[im 88/158  soft-tissue]
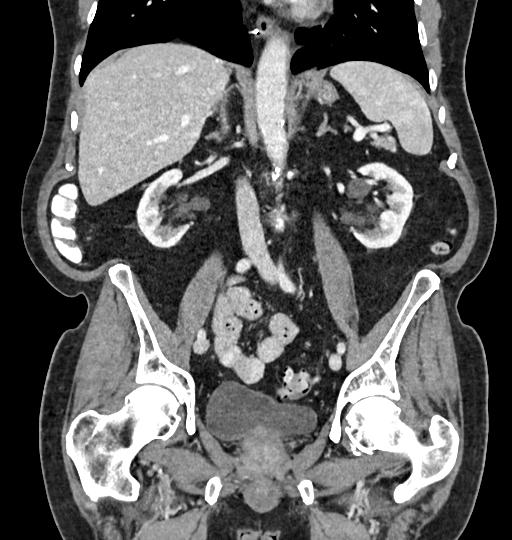

[13 of 46 positions shown; findings below may reference images not displayed]

FINDINGS: Lower Chest: No acute findings.

Hepatobiliary: No hepatic masses identified. A few tiny sub-cm
hepatic cysts are again seen in the left lobe. Gallbladder is
unremarkable. No evidence of biliary ductal dilatation.

Pancreas:  No mass or inflammatory changes.

Spleen: Within normal limits in size and appearance.

Adrenals/Urinary Tract: No masses identified. Stable small bilateral
renal sinus cysts again noted. No evidence of ureteral calculi or
hydronephrosis.

Stomach/Bowel: No evidence of obstruction, inflammatory process or
abnormal fluid collections. Diverticulosis is seen mainly involving
the descending and sigmoid colon, however there is no evidence of
diverticulitis. Several small polypoid soft tissue densities are
seen in the rectum, measuring up to 11 mm. Differential diagnosis
includes rectal polyps or adherent stool.

Vascular/Lymphatic: No pathologically enlarged lymph nodes. No
abdominal aortic aneurysm. Aortic atherosclerosis noted.

Reproductive:  No mass or other significant abnormality.

Other:  None.

Musculoskeletal:  No suspicious bone lesions identified.
IMPRESSION: 1. Colonic diverticulosis, without radiographic evidence of
diverticulitis or other acute findings.
2. Several small polypoid densities in the rectum, which may be due
to rectal polyps or adherent stool. Consider proctoscopy or
colonoscopy for further evaluation.

## 2020-09-28 ENCOUNTER — Other Ambulatory Visit: Payer: Self-pay | Admitting: Family Medicine

## 2020-10-23 ENCOUNTER — Telehealth: Payer: Self-pay | Admitting: Family Medicine

## 2020-10-23 ENCOUNTER — Other Ambulatory Visit: Payer: Self-pay | Admitting: Family Medicine

## 2020-10-23 DIAGNOSIS — E039 Hypothyroidism, unspecified: Secondary | ICD-10-CM

## 2020-10-23 DIAGNOSIS — I10 Essential (primary) hypertension: Secondary | ICD-10-CM

## 2020-10-23 NOTE — Telephone Encounter (Signed)
Spoke with the patient's wife. I explained that it has been over a year since he was seen. Appointment has been scheduled. Nothing further needed.

## 2020-10-23 NOTE — Telephone Encounter (Signed)
Patient's wife Opal Sidles called today saying that she got a call from [facility] regarding the prescriptions for levothyroxine (SYNTHROID) 50 MCG tablet and lisinopril (ZESTRIL) 20 MG tablet for her husband. She says that the pharmacy says they were denied and she would like to know why.  She also states that it has been a while since they have come in and was wondering if they are needing to schedule any visits soon or if the frequency of their visits have changed due to their age.  Jane's contact number is [phone].  Please advise.

## 2020-10-24 ENCOUNTER — Other Ambulatory Visit: Payer: Self-pay

## 2020-10-25 ENCOUNTER — Ambulatory Visit (INDEPENDENT_AMBULATORY_CARE_PROVIDER_SITE_OTHER): Payer: Medicare Other | Admitting: Family Medicine

## 2020-10-25 VITALS — BP 118/62 | HR 85 | Temp 98.1°F | Ht 66.0 in | Wt 131.4 lb

## 2020-10-25 DIAGNOSIS — E785 Hyperlipidemia, unspecified: Secondary | ICD-10-CM | POA: Diagnosis not present

## 2020-10-25 DIAGNOSIS — N183 Chronic kidney disease, stage 3 unspecified: Secondary | ICD-10-CM | POA: Diagnosis not present

## 2020-10-25 DIAGNOSIS — I10 Essential (primary) hypertension: Secondary | ICD-10-CM

## 2020-10-25 DIAGNOSIS — E039 Hypothyroidism, unspecified: Secondary | ICD-10-CM

## 2020-10-25 DIAGNOSIS — F419 Anxiety disorder, unspecified: Secondary | ICD-10-CM

## 2020-10-25 DIAGNOSIS — Z0000 Encounter for general adult medical examination without abnormal findings: Secondary | ICD-10-CM | POA: Diagnosis not present

## 2020-10-25 LAB — LIPID PANEL
Cholesterol: 155 mg/dL (ref 0–200)
HDL: 71.8 mg/dL (ref >39.00)
LDL Cholesterol: 68 mg/dL (ref 0–99)
NonHDL: 83.46
Total CHOL/HDL Ratio: 2
Triglycerides: 76 mg/dL (ref 0.0–149.0)
VLDL: 15.2 mg/dL (ref 0.0–40.0)

## 2020-10-25 LAB — CBC WITH DIFFERENTIAL/PLATELET
Basophils Absolute: 0.2 10*3/uL — ABNORMAL HIGH (ref 0.0–0.1)
Basophils Relative: 1.7 % (ref 0.0–3.0)
Eosinophils Absolute: 0.5 10*3/uL (ref 0.0–0.7)
Eosinophils Relative: 6 % — ABNORMAL HIGH (ref 0.0–5.0)
HCT: 41.6 % (ref 39.0–52.0)
Hemoglobin: 13.7 g/dL (ref 13.0–17.0)
Lymphocytes Relative: 17.2 % (ref 12.0–46.0)
Lymphs Abs: 1.6 10*3/uL (ref 0.7–4.0)
MCHC: 32.9 g/dL (ref 30.0–36.0)
MCV: 101.9 fl — ABNORMAL HIGH (ref 78.0–100.0)
Monocytes Absolute: 0.7 10*3/uL (ref 0.1–1.0)
Monocytes Relative: 7.2 % (ref 3.0–12.0)
Neutro Abs: 6.2 10*3/uL (ref 1.4–7.7)
Neutrophils Relative %: 67.9 % (ref 43.0–77.0)
Platelets: 236 10*3/uL (ref 150.0–400.0)
RBC: 4.09 Mil/uL — ABNORMAL LOW (ref 4.22–5.81)
RDW: 13.6 % (ref 11.5–15.5)
WBC: 9.1 10*3/uL (ref 4.0–10.5)

## 2020-10-25 LAB — HEPATIC FUNCTION PANEL
ALT: 16 U/L (ref 0–53)
AST: 14 U/L (ref 0–37)
Albumin: 4.2 g/dL (ref 3.5–5.2)
Alkaline Phosphatase: 61 U/L (ref 39–117)
Bilirubin, Direct: 0.1 mg/dL (ref 0.0–0.3)
Total Bilirubin: 0.8 mg/dL (ref 0.2–1.2)
Total Protein: 6.4 g/dL (ref 6.0–8.3)

## 2020-10-25 LAB — BASIC METABOLIC PANEL
BUN: 20 mg/dL (ref 6–23)
CO2: 27 mEq/L (ref 19–32)
Calcium: 9.6 mg/dL (ref 8.4–10.5)
Chloride: 105 mEq/L (ref 96–112)
Creatinine, Ser: 1.28 mg/dL (ref 0.40–1.50)
GFR: 50.56 mL/min — ABNORMAL LOW (ref >60.00)
Glucose, Bld: 92 mg/dL (ref 70–99)
Potassium: 4.4 mEq/L (ref 3.5–5.1)
Sodium: 141 mEq/L (ref 135–145)

## 2020-10-25 LAB — TSH: TSH: 0.97 u[IU]/mL (ref 0.35–5.50)

## 2020-10-25 MED ORDER — LEVOTHYROXINE SODIUM 50 MCG PO TABS: ORAL_TABLET | ORAL | 3 refills | Status: DC

## 2020-10-25 MED ORDER — LORAZEPAM 1 MG PO TABS: ORAL_TABLET | ORAL | 5 refills | Status: DC

## 2020-10-25 MED ORDER — LISINOPRIL 20 MG PO TABS: ORAL_TABLET | ORAL | 3 refills | Status: DC

## 2020-10-25 NOTE — Patient Instructions (Signed)
Consider Shingrix vaccine and check at pharmacy if interested.   

## 2020-10-25 NOTE — Progress Notes (Signed)
Established Patient Office Visit  Subjective:  Patient ID: Timothy Garrett, male    DOB: 1931/02/26  Age: 87 y.o. MRN: [id number]  CC:  Chief Complaint  Patient presents with   Annual Exam    HPI Timothy Garrett presents for physical exam and medical follow-up.  Communication is somewhat difficult as he is congenitally deaf right ear and has impairment in the left no hearing aid currently.  His chronic problems include history of hypertension, diverticulosis of the colon, irritable bowel syndrome, hypothyroidism, BPH, chronic kidney disease stage III, hyperlipidemia, history of kidney stones, lumbar stenosis.  He did have a fall earlier this year.  He states he missed a step at home and ended up having leg injury with skin wound that subsequently got infected.  He went to urgent care and was placed on antibiotics and this did gradually improve.  He is apparently still taking aspirin daily but has no history of CVA or CAD.  He is needing refills of several medications including his thyroid medication, lisinopril, and lorazepam.  He takes 1/2 to 1 tablet nightly and has been on this for many years.  Maintenance reviewed:  -States he had flu vaccine at pharmacy about 2 weeks ago -Had tetanus booster 8/22 -Pneumonia vaccines complete -No history of shingles vaccine -Aged out of colonoscopy.  Social history-married.  Quit smoking about 50 years ago.  No alcohol.  He has a son and a daughter.  4 grandchildren and 1 great grandchild.  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  His mother also died very early age 42.  Etiology not clear.  Chart says that she had "stomach cancer "but he seemed to be unaware of that.  He reportedly also had a sister with history of CAD.   Past Medical History:  Diagnosis Date   Arthritis    Diverticulitis    Diverticulitis    GERD (gastroesophageal  reflux disease)    Heartburn   Hyperlipemia    Hypertension    IBS (irritable bowel syndrome)    Kidney stones     Past Surgical History:  Procedure Laterality Date   BACK SURGERY     EYE SURGERY  2016   cataract surgery   TONSILLECTOMY  1941   ulcer surgery      Family History  Problem Relation Age of Onset   Stomach cancer Mother    Hypertension Mother    Heart disease Father    Heart attack Father    Heart attack Sister    Stroke Maternal Grandfather    Heart attack Paternal Grandfather     Social History   Socioeconomic History   Marital status: Married    Spouse name: Not on file   Number of children: 2   Years of education: Not on file   Highest education level: Not on file  Occupational History   Occupation: Medical illustrator    Comment: Retired  Tobacco Use   Smoking status: Former   Smokeless tobacco: Never   Tobacco comments:    Quit 60 years ago  Scientific laboratory technician Use: Never used  Substance and Sexual Activity   Alcohol use: No   Drug use: No   Sexual activity: Not Currently  Other Topics Concern   Not on file  Social History Narrative   Lives with wife on one level condo, recently downsized   Has  two children, local, 4 grandchildren   Has one dog, Sam, enjoys walking it, but no other regular exercise   Enjoys doing Haematologist, Event organiser   Social Determinants of Radio broadcast assistant Strain: Low Risk    Difficulty of Paying Living Expenses: Not hard at all  Food Insecurity: No Food Insecurity   Worried About Charity fundraiser in the Last Year: Never true   Arboriculturist in the Last Year: Never true  Transportation Needs: No Transportation Needs   Lack of Transportation (Medical): No   Lack of Transportation (Non-Medical): No  Physical Activity: Inactive   Days of Exercise per Week: 0 days   Minutes of Exercise per Session: 0 min  Stress: No Stress Concern Present   Feeling of Stress : Not at all  Social Connections:  Moderately Integrated   Frequency of Communication with Friends and Family: More than three times a week   Frequency of Social Gatherings with Friends and Family: Three times a week   Attends Religious Services: More than 4 times per year   Active Member of Clubs or Organizations: No   Attends Archivist Meetings: Never   Marital Status: Married  Human resources officer Violence: Not At Risk   Fear of Current or Ex-Partner: No   Emotionally Abused: No   Physically Abused: No   Sexually Abused: No    Outpatient Medications Prior to Visit  Medication Sig Dispense Refill   acetaminophen (TYLENOL) 500 MG tablet Take 1,000 mg by mouth every 6 (six) hours as needed (for pain).     atorvastatin (LIPITOR) 20 MG tablet TAKE 1/2 TABLET BY MOUTH AT BEDTIME 45 tablet 2   Cholecalciferol (VITAMIN D) 50 MCG (2000 UT) CAPS Take 1 capsule by mouth daily.     dicyclomine (BENTYL) 20 MG tablet Take 20 mg by mouth two times a day- morning and night 180 tablet 1   gabapentin (NEURONTIN) 100 MG capsule TAKE 2 CAPSULES BY MOUTH IN THE MORNING AND 3 CAPSULES AT BEDTIME 150 capsule 5   Multiple Vitamins-Minerals (CENTRUM SILVER 50+MEN) TABS Take 1 tablet by mouth daily with breakfast.     pantoprazole (PROTONIX) 40 MG tablet TAKE 1 TABLET BY MOUTH EVERY DAY 90 tablet 1   tamsulosin (FLOMAX) 0.4 MG CAPS capsule TAKE 1 CAPSULE BY MOUTH AT BEDTIME 90 capsule 3   aspirin 81 MG tablet Take 81 mg by mouth at bedtime.      levothyroxine (SYNTHROID) 50 MCG tablet TAKE 1 TABLET(50 MCG) BY MOUTH DAILY 90 tablet 0   lisinopril (ZESTRIL) 20 MG tablet TAKE 1 TABLET(20 MG) BY MOUTH AT BEDTIME 90 tablet 0   LORazepam (ATIVAN) 1 MG tablet TAKE 1/2 TO 1 TABLET(0.5 TO 1 MG) BY MOUTH AT BEDTIME 30 tablet 1   metroNIDAZOLE (FLAGYL) 500 MG tablet Take 1 tablet (500 mg total) by mouth 3 (three) times daily. DO NOT CONSUME ALCOHOL WHILE TAKING THIS MEDICATION. 30 tablet 0   No facility-administered medications prior to visit.     Allergies  Allergen Reactions   Sulfamethoxazole-Trimethoprim Itching    ROS Review of Systems  Constitutional:  Negative for appetite change, fatigue, fever and unexpected weight change.  Eyes:  Negative for visual disturbance.  Respiratory:  Negative for cough, chest tightness and shortness of breath.   Cardiovascular:  Negative for chest pain, palpitations and leg swelling.  Gastrointestinal:  Negative for abdominal pain.  Genitourinary:        Does have occasional slow stream  but for the most part feels like he is able to empty his bladder.  Neurological:  Negative for dizziness, syncope, weakness, light-headedness and headaches.     Objective:    Physical Exam Vitals reviewed.  Constitutional:      General: He is not in acute distress.    Appearance: Normal appearance. He is well-developed.  HENT:     Head: Normocephalic and atraumatic.     Right Ear: External ear normal.     Left Ear: External ear normal.  Eyes:     Conjunctiva/sclera: Conjunctivae normal.  Neck:     Thyroid: No thyromegaly.  Cardiovascular:     Rate and Rhythm: Normal rate and regular rhythm.     Heart sounds: Normal heart sounds.  Pulmonary:     Effort: No respiratory distress.     Breath sounds: No wheezing or rales.  Abdominal:     General: Bowel sounds are normal. There is no distension.     Palpations: Abdomen is soft. There is no mass.     Tenderness: There is no abdominal tenderness. There is no guarding or rebound.  Musculoskeletal:     Cervical back: Normal range of motion and neck supple.  Lymphadenopathy:     Cervical: No cervical adenopathy.  Skin:    Findings: No rash.  Neurological:     Mental Status: He is alert and oriented to person, place, and time.     Cranial Nerves: No cranial nerve deficit.    BP 118/62 (BP Location: Left Arm, Patient Position: Sitting, Cuff Size: Normal)   Pulse 85   Temp 98.1 F (36.7 C) (Oral)   Ht '5\' 6"'$  (1.676 m)   Wt 131 lb 6.4 oz (59.6  kg)   SpO2 98%   BMI 21.21 kg/m  Wt Readings from Last 3 Encounters:  10/25/20 131 lb 6.4 oz (59.6 kg)  12/20/19 142 lb 2 oz (64.5 kg)  09/29/19 144 lb 11.2 oz (65.6 kg)     Health Maintenance Due  Topic Date Due   Zoster Vaccines- Shingrix (1 of 2) Never done   COVID-19 Vaccine (4 - Booster for Pfizer series) 03/07/2020    There are no preventive care reminders to display for this patient.  Lab Results  Component Value Date   TSH 1.32 08/04/2019   Lab Results  Component Value Date   WBC 8.2 06/24/2019   HGB 13.6 06/24/2019   HCT 40.5 06/24/2019   MCV 102.5 (H) 06/24/2019   PLT 278.0 06/24/2019   Lab Results  Component Value Date   NA 141 06/24/2019   K 4.9 06/24/2019   CO2 28 06/24/2019   GLUCOSE 100 (H) 06/24/2019   BUN 15 06/24/2019   CREATININE 1.23 06/24/2019   BILITOT 0.5 12/28/2018   ALKPHOS 66 12/28/2018   AST 17 12/28/2018   ALT 22 12/28/2018   PROT 6.7 12/28/2018   ALBUMIN 4.5 12/28/2018   CALCIUM 9.7 06/24/2019   ANIONGAP 9 04/28/2017   GFR 55.86 (L) 06/24/2019   Lab Results  Component Value Date   CHOL 210 (H) 12/28/2018   Lab Results  Component Value Date   HDL 53.40 12/28/2018   Lab Results  Component Value Date   LDLCALC 92 08/19/2008   Lab Results  Component Value Date   TRIG 256.0 (H) 12/28/2018   Lab Results  Component Value Date   CHOLHDL 4 12/28/2018   No results found for: HGBA1C    Assessment & Plan:   Physical exam.  We  did note that his weight is down somewhat from last year but he states his appetite is normal.  We discussed the following issues  -Flu vaccine already given -Pneumonia vaccines complete -We did suggest that he consider shingles vaccine at some point this year -He has been taking baby aspirin and we reviewed guidelines for American Heart Association.  He does not have clear reason to stay on this at his age with no prior history of stroke or CAD. -Refilled medications including lisinopril and  levothyroxine. -Obtain follow-up labs including lipids, hepatic, basic metabolic panel, CBC, TSH   Meds ordered this encounter  Medications   lisinopril (ZESTRIL) 20 MG tablet    Sig: TAKE 1 TABLET(20 MG) BY MOUTH AT BEDTIME    Dispense:  90 tablet    Refill:  3   levothyroxine (SYNTHROID) 50 MCG tablet    Sig: TAKE 1 TABLET(50 MCG) BY MOUTH DAILY    Dispense:  90 tablet    Refill:  3   LORazepam (ATIVAN) 1 MG tablet    Sig: TAKE 1/2 TO 1 TABLET(0.5 TO 1 MG) BY MOUTH AT BEDTIME    Dispense:  30 tablet    Refill:  5    Follow-up: No follow-ups on file.    Carolann Littler, MD

## 2020-11-08 DIAGNOSIS — H353121 Nonexudative age-related macular degeneration, left eye, early dry stage: Secondary | ICD-10-CM | POA: Diagnosis not present

## 2020-11-08 DIAGNOSIS — H43813 Vitreous degeneration, bilateral: Secondary | ICD-10-CM | POA: Diagnosis not present

## 2020-11-08 DIAGNOSIS — H35371 Puckering of macula, right eye: Secondary | ICD-10-CM | POA: Diagnosis not present

## 2020-11-08 DIAGNOSIS — H26493 Other secondary cataract, bilateral: Secondary | ICD-10-CM | POA: Diagnosis not present

## 2020-12-20 ENCOUNTER — Ambulatory Visit: Payer: Medicare Other

## 2021-01-17 ENCOUNTER — Encounter: Payer: Self-pay | Admitting: Family Medicine

## 2021-01-17 ENCOUNTER — Ambulatory Visit: Payer: Medicare Other | Admitting: Family Medicine

## 2021-01-17 VITALS — BP 124/60 | HR 72 | Temp 97.4°F | Ht 66.0 in | Wt 135.9 lb

## 2021-01-17 DIAGNOSIS — R292 Abnormal reflex: Secondary | ICD-10-CM

## 2021-01-17 DIAGNOSIS — M5432 Sciatica, left side: Secondary | ICD-10-CM

## 2021-01-17 DIAGNOSIS — R29898 Other symptoms and signs involving the musculoskeletal system: Secondary | ICD-10-CM | POA: Diagnosis not present

## 2021-01-17 MED ORDER — GABAPENTIN 100 MG PO CAPS: ORAL_CAPSULE | ORAL | 3 refills | Status: DC

## 2021-01-17 MED ORDER — PREDNISONE 10 MG PO TABS: ORAL_TABLET | ORAL | 0 refills | Status: DC

## 2021-01-17 NOTE — Progress Notes (Signed)
Established Patient Office Visit  Subjective:  Patient ID: Timothy Garrett, male    DOB: 06-Jul-1931  Age: 87 y.o. MRN: [id number]  CC:  Chief Complaint  Patient presents with   Back Pain    Patient complains of back pain, x1 week   Numbness    Patient complains of numbness in left leg, x1 week   Fall    HPI Timothy Garrett presents for pain left lower extremity along with some numbness and weakness.  This started this past Thursday.  Denies any recent injury prior to onset.  However, he did have a couple falls over the weekend which he attributes to weakness left lower extremity.  He relates history of lumbar surgery sometime around 2010 over [place].  He thinks this is L4-5 disc.  He has severe pain 10 out of 10 in severity on Thursday currently 3-4 out of 10.  He takes gabapentin at baseline.  He has a sharp pain going down the left lower extremity with some numbness of the left leg.  He has weakness with negotiating stairs and walking.  No urine or stool incontinence.  Pain is worse with back extension and worse seated versus standing and also worse when leaning to the left.  No fevers or chills.  No appetite or weight changes.  Wt Readings from Last 3 Encounters:  01/17/21 135 lb 14.4 oz (61.6 kg)  10/25/20 131 lb 6.4 oz (59.6 kg)  12/20/19 142 lb 2 oz (64.5 kg)     Past Medical History:  Diagnosis Date   Arthritis    Diverticulitis    Diverticulitis    GERD (gastroesophageal reflux disease)    Heartburn   Hyperlipemia    Hypertension    IBS (irritable bowel syndrome)    Kidney stones     Past Surgical History:  Procedure Laterality Date   BACK SURGERY     EYE SURGERY  2016   cataract surgery   TONSILLECTOMY  1941   ulcer surgery      Family History  Problem Relation Age of Onset   Stomach cancer Mother    Hypertension Mother    Heart disease Father    Heart attack Father    Heart attack Sister    Stroke Maternal Grandfather    Heart attack  Paternal Grandfather     Social History   Socioeconomic History   Marital status: Married    Spouse name: Not on file   Number of children: 2   Years of education: Not on file   Highest education level: Not on file  Occupational History   Occupation: Medical illustrator    Comment: Retired  Tobacco Use   Smoking status: Former   Smokeless tobacco: Never   Tobacco comments:    Quit 60 years ago  Scientific laboratory technician Use: Never used  Substance and Sexual Activity   Alcohol use: No   Drug use: No   Sexual activity: Not Currently  Other Topics Concern   Not on file  Social History Narrative   Lives with wife on one level condo, recently downsized   Has two children, local, 4 grandchildren   Has one dog, Sam, enjoys walking it, but no other regular exercise   Enjoys doing Haematologist, Event organiser   Social Determinants of Radio broadcast assistant Strain: Not on file  Food Insecurity: Not on file  Transportation Needs: Not on file  Physical Activity: Not on file  Stress: Not on file  Social Connections: Not on file  Intimate Partner Violence: Not on file    Outpatient Medications Prior to Visit  Medication Sig Dispense Refill   acetaminophen (TYLENOL) 500 MG tablet Take 1,000 mg by mouth every 6 (six) hours as needed (for pain).     atorvastatin (LIPITOR) 20 MG tablet TAKE 1/2 TABLET BY MOUTH AT BEDTIME 45 tablet 2   Cholecalciferol (VITAMIN D) 50 MCG (2000 UT) CAPS Take 1 capsule by mouth daily.     dicyclomine (BENTYL) 20 MG tablet Take 20 mg by mouth two times a day- morning and night 180 tablet 1   levothyroxine (SYNTHROID) 50 MCG tablet TAKE 1 TABLET(50 MCG) BY MOUTH DAILY 90 tablet 3   lisinopril (ZESTRIL) 20 MG tablet TAKE 1 TABLET(20 MG) BY MOUTH AT BEDTIME 90 tablet 3   LORazepam (ATIVAN) 1 MG tablet TAKE 1/2 TO 1 TABLET(0.5 TO 1 MG) BY MOUTH AT BEDTIME 30 tablet 5   Multiple Vitamins-Minerals (CENTRUM SILVER 50+MEN) TABS Take 1 tablet by mouth daily with breakfast.      pantoprazole (PROTONIX) 40 MG tablet TAKE 1 TABLET BY MOUTH EVERY DAY 90 tablet 1   tamsulosin (FLOMAX) 0.4 MG CAPS capsule TAKE 1 CAPSULE BY MOUTH AT BEDTIME 90 capsule 3   gabapentin (NEURONTIN) 100 MG capsule TAKE 2 CAPSULES BY MOUTH IN THE MORNING AND 3 CAPSULES AT BEDTIME 150 capsule 5   No facility-administered medications prior to visit.    Allergies  Allergen Reactions   Sulfamethoxazole-Trimethoprim Itching    ROS Review of Systems  Constitutional:  Negative for appetite change, chills, fever and unexpected weight change.  Gastrointestinal:  Negative for abdominal pain.  Genitourinary:  Negative for difficulty urinating.  Musculoskeletal:  Positive for back pain.  Neurological:  Positive for weakness and numbness.  Hematological:  Negative for adenopathy.     Objective:    Physical Exam Vitals reviewed.  Constitutional:      Appearance: Normal appearance.  Cardiovascular:     Rate and Rhythm: Normal rate and regular rhythm.  Pulmonary:     Effort: Pulmonary effort is normal.     Breath sounds: Normal breath sounds.  Musculoskeletal:     Comments: Straight leg raise are negative bilaterally.  He has no spinal tenderness in the lumbar spine.  Neurological:     Mental Status: He is alert.     Comments: He has basically absence of left knee reflex and 2+ on the right.  Diminished left ankle reflex compared to the right (he states this is new).  He has significant weakness especially with left knee extension compared to the right but also some weakness with left plantar flexion and dorsiflexion.  Also has some impairment with sensory function left leg compared to the right    BP 124/60 (BP Location: Left Arm, Patient Position: Sitting, Cuff Size: Normal)   Pulse 72   Temp (!) 97.4 F (36.3 C) (Oral)   Ht 5\' 6"  (1.676 m)   Wt 135 lb 14.4 oz (61.6 kg)   SpO2 99%   BMI 21.93 kg/m  Wt Readings from Last 3 Encounters:  01/17/21 135 lb 14.4 oz (61.6 kg)  10/25/20  131 lb 6.4 oz (59.6 kg)  12/20/19 142 lb 2 oz (64.5 kg)     Health Maintenance Due  Topic Date Due   Zoster Vaccines- Shingrix (1 of 2) Never done   COVID-19 Vaccine (4 - Booster for Pfizer series) 02/08/2020    There are no preventive care reminders to display  for this patient.  Lab Results  Component Value Date   TSH 0.97 10/25/2020   Lab Results  Component Value Date   WBC 9.1 10/25/2020   HGB 13.7 10/25/2020   HCT 41.6 10/25/2020   MCV 101.9 (H) 10/25/2020   PLT 236.0 10/25/2020   Lab Results  Component Value Date   NA 141 10/25/2020   K 4.4 10/25/2020   CO2 27 10/25/2020   GLUCOSE 92 10/25/2020   BUN 20 10/25/2020   CREATININE 1.28 10/25/2020   BILITOT 0.8 10/25/2020   ALKPHOS 61 10/25/2020   AST 14 10/25/2020   ALT 16 10/25/2020   PROT 6.4 10/25/2020   ALBUMIN 4.2 10/25/2020   CALCIUM 9.6 10/25/2020   ANIONGAP 9 04/28/2017   GFR 50.56 (L) 10/25/2020   Lab Results  Component Value Date   CHOL 155 10/25/2020   Lab Results  Component Value Date   HDL 71.80 10/25/2020   Lab Results  Component Value Date   LDLCALC 68 10/25/2020   Lab Results  Component Value Date   TRIG 76.0 10/25/2020   Lab Results  Component Value Date   CHOLHDL 2 10/25/2020   No results found for: HGBA1C    Assessment & Plan:   Patient presents with left-sided sciatica symptoms with acute onset last Thursday.  He has neurologic findings which are concerning with absence of left knee reflex and also diminished ankle reflex on the left side with fairly profound weakness with left knee extension but also plantar flexion dorsiflexion.  -Start prednisone taper -Refill gabapentin which he takes chronically -Set up for urgent MRI to further assess -Watch closely for any new neurologic symptoms such as urine or stool incontinence or any further weakness -Suggested no driving until further investigated and also regular use of walker.  His son is helping to order this.  Meds  ordered this encounter  Medications   gabapentin (NEURONTIN) 100 MG capsule    Sig: TAKE 2 CAPSULES BY MOUTH IN THE MORNING AND 3 CAPSULES AT BEDTIME    Dispense:  450 capsule    Refill:  3   predniSONE (DELTASONE) 10 MG tablet    Sig: Taper as follows:  4-4-3-3-2-2-1-1    Dispense:  20 tablet    Refill:  0    Follow-up: No follow-ups on file.    Carolann Littler, MD

## 2021-01-17 NOTE — Patient Instructions (Signed)
I am setting up MRI of lumbar spine to assess  Use walker for now to reduce fall risk

## 2021-01-19 ENCOUNTER — Telehealth: Payer: Self-pay

## 2021-01-19 NOTE — Telephone Encounter (Signed)
Wife of patient called asking if referral could be sent to another office because [facility] has a 2 week wait The wife thought the urgent request meant sooner.

## 2021-01-19 NOTE — Telephone Encounter (Signed)
Spoke with the patients wife. I advised that they keep this appointment and explained that many offices are backed up due to covid and this is actually a pretty good turnaround time. She stated they would keep the appointment in 2 weeks. Nothing further needed.

## 2021-01-21 ENCOUNTER — Other Ambulatory Visit: Payer: Self-pay | Admitting: Family Medicine

## 2021-01-29 ENCOUNTER — Telehealth: Payer: Self-pay | Admitting: Family Medicine

## 2021-01-29 MED ORDER — TRAMADOL HCL 50 MG PO TABS: 50.0000 mg | ORAL_TABLET | Freq: Four times a day (QID) | ORAL | 0 refills | Status: AC | PRN

## 2021-01-29 NOTE — Telephone Encounter (Signed)
Spoke with the patients wife. She is aware Dr. Elease Hashimoto has sent in a RX.

## 2021-01-29 NOTE — Telephone Encounter (Signed)
Opal Sidles (patient wife) called stating that patient is getting a MRI done on 12/22. Patient is still experiencing pain and has been taking tylenol and ibuprofen to help but the ibuprofen is making his stomach hurt.  Opal Sidles is requesting something to be sent to patient pharmacy for pain.  Opal Sidles could be contacted at [phone].  Please advise.

## 2021-01-29 NOTE — Telephone Encounter (Signed)
I sent in some tramadol to take every 6 hours as needed

## 2021-02-01 ENCOUNTER — Ambulatory Visit
Admission: RE | Admit: 2021-02-01 | Discharge: 2021-02-01 | Disposition: A | Discharge status: Home / Self Care | Payer: Medicare Other | Source: Ambulatory Visit | Attending: Family Medicine | Admitting: Family Medicine

## 2021-02-01 ENCOUNTER — Other Ambulatory Visit: Payer: Self-pay

## 2021-02-01 DIAGNOSIS — M5432 Sciatica, left side: Secondary | ICD-10-CM

## 2021-02-01 DIAGNOSIS — M545 Low back pain, unspecified: Secondary | ICD-10-CM | POA: Diagnosis not present

## 2021-02-01 DIAGNOSIS — M2578 Osteophyte, vertebrae: Secondary | ICD-10-CM | POA: Diagnosis not present

## 2021-02-01 DIAGNOSIS — M48061 Spinal stenosis, lumbar region without neurogenic claudication: Secondary | ICD-10-CM | POA: Diagnosis not present

## 2021-02-01 DIAGNOSIS — R200 Anesthesia of skin: Secondary | ICD-10-CM | POA: Diagnosis not present

## 2021-02-01 DIAGNOSIS — R29898 Other symptoms and signs involving the musculoskeletal system: Secondary | ICD-10-CM

## 2021-02-06 ENCOUNTER — Other Ambulatory Visit: Payer: Self-pay

## 2021-02-06 DIAGNOSIS — M5432 Sciatica, left side: Secondary | ICD-10-CM

## 2021-02-07 ENCOUNTER — Telehealth: Payer: Self-pay | Admitting: Family Medicine

## 2021-02-07 NOTE — Telephone Encounter (Signed)
Called patient daughter reviewed all information and repeated back to me. Will call if any questions.  They have received call from office and has appointment on Friday. Advised symptoms to seek treatment at ED for as listed in note from provider.

## 2021-02-07 NOTE — Telephone Encounter (Signed)
Patient's daughter called to get opinion on how soon Dr.Burchette felt patient needed to be seen by Neurosurgery and Spine. Daughter states that patient seems to be in a lot of pain and she was hoping to get him in sometime at the end of this week or next week. She asked if someone from our office would call to get appointment set up as when her mother spoke with Dr.Burchette it sounded like they were supposed to call themselves. I let her know that we wouldn't call to set up appointment, either their office would call them to schedule or they would call themselves. I asked if they had already set up an appointment and she stated that her mother called but she did not hear anything back yet. I told her to call again and to schedule for the next available appointment to get a head start, also to ask Neuro to call if they have any cancellations or can get them in sooner. Daughter verbalized understanding.  Daughter would like a callback to discuss how soon Dr.Burchette feels patient needs to be seen    Please advise

## 2021-02-09 ENCOUNTER — Telehealth: Payer: Self-pay | Admitting: Family Medicine

## 2021-02-09 DIAGNOSIS — I10 Essential (primary) hypertension: Secondary | ICD-10-CM | POA: Diagnosis not present

## 2021-02-09 DIAGNOSIS — M5416 Radiculopathy, lumbar region: Secondary | ICD-10-CM | POA: Diagnosis not present

## 2021-02-09 DIAGNOSIS — M48061 Spinal stenosis, lumbar region without neurogenic claudication: Secondary | ICD-10-CM | POA: Diagnosis not present

## 2021-02-09 MED ORDER — TRAMADOL HCL 50 MG PO TABS: 50.0000 mg | ORAL_TABLET | Freq: Four times a day (QID) | ORAL | 0 refills | Status: DC | PRN

## 2021-02-09 NOTE — Telephone Encounter (Signed)
Tramadol is not on the current med list. Please advise.

## 2021-02-09 NOTE — Telephone Encounter (Signed)
Spoke with the patients wife, she is aware the Rx has been sent in.

## 2021-02-09 NOTE — Telephone Encounter (Signed)
Refill sent.

## 2021-02-09 NOTE — Telephone Encounter (Signed)
Pt needs a refill on tramadol 50 mg sent to   [facility], [place] [address] Phone:  [phone]  Fax:  [phone]

## 2021-02-19 DIAGNOSIS — M5416 Radiculopathy, lumbar region: Secondary | ICD-10-CM | POA: Diagnosis not present

## 2021-02-19 DIAGNOSIS — M545 Low back pain, unspecified: Secondary | ICD-10-CM | POA: Diagnosis not present

## 2021-02-22 ENCOUNTER — Other Ambulatory Visit: Payer: Self-pay | Admitting: Family Medicine

## 2021-02-22 DIAGNOSIS — M545 Low back pain, unspecified: Secondary | ICD-10-CM | POA: Diagnosis not present

## 2021-02-22 DIAGNOSIS — M5416 Radiculopathy, lumbar region: Secondary | ICD-10-CM | POA: Diagnosis not present

## 2021-02-22 NOTE — Telephone Encounter (Signed)
Please advise. Rx is not on the current med list 

## 2021-02-27 DIAGNOSIS — M5416 Radiculopathy, lumbar region: Secondary | ICD-10-CM | POA: Diagnosis not present

## 2021-02-27 DIAGNOSIS — M545 Low back pain, unspecified: Secondary | ICD-10-CM | POA: Diagnosis not present

## 2021-03-01 ENCOUNTER — Telehealth: Payer: Self-pay | Admitting: Family Medicine

## 2021-03-01 ENCOUNTER — Encounter: Payer: Self-pay | Admitting: Family Medicine

## 2021-03-01 ENCOUNTER — Telehealth (INDEPENDENT_AMBULATORY_CARE_PROVIDER_SITE_OTHER): Payer: Medicare Other | Admitting: Family Medicine

## 2021-03-01 VITALS — BP 133/76 | HR 85 | Temp 98.2°F

## 2021-03-01 DIAGNOSIS — R1032 Left lower quadrant pain: Secondary | ICD-10-CM | POA: Diagnosis not present

## 2021-03-01 MED ORDER — CIPROFLOXACIN HCL 250 MG PO TABS: 250.0000 mg | ORAL_TABLET | Freq: Two times a day (BID) | ORAL | 0 refills | Status: DC

## 2021-03-01 MED ORDER — METRONIDAZOLE 500 MG PO TABS: 500.0000 mg | ORAL_TABLET | Freq: Three times a day (TID) | ORAL | 0 refills | Status: DC

## 2021-03-01 NOTE — Telephone Encounter (Signed)
disregard

## 2021-03-01 NOTE — Telephone Encounter (Signed)
Pt wife is calling and her husband is having diverticulitis flare up and would like something sent to pharm. Pt wife is aware md out of appt and he may need an appt and she said she does not have a way to bring him to an appt. PT wife said he is taking tramadol for back pain .  [id number] [address], [place] Phone:  [phone]  Fax:  [phone]

## 2021-03-01 NOTE — Progress Notes (Signed)
Virtual Visit via Telephone Note  I connected with Timothy Garrett on 03/01/21 at  3:00 PM EST by telephone and verified that I am speaking with the correct person using two identifiers.   I discussed the limitations of performing an evaluation and management service by telephone and requested permission for a phone visit. The patient expressed understanding and agreed to proceed.  Location patient:  home Location provider: work or home office Participants present for the call: patient, provider, pt's son and wife Patient did not have a visit with me in the prior 7 days to address this/these issue(s).   History of Present Illness:  Acute telemedicine visit for "my diverticulitis is flared up again": -Onset: yesterday -reports has flares of diverticulitis a few times per year for many years and reports this feels the same, reports was under the care of a GI doctor, Dr. Maurene Capes for this for some time...but that PCP now gives abx when recurs -had CT in 2021 -the prefer cipro and flagyl over diet or inperson evaluation as this as worked for him in the past and he reports tolerated well -Symptoms include: LLQ abd pain, nausea (denies emesis), last bm yesterday - soft log, normal BM -Denies:fevers, vomiting, melena, hematochezia, urinary symptoms, cough, congestion -Pertinent past medical history: see below -Pertinent medication allergies:  Allergies  Allergen Reactions   Sulfamethoxazole-Trimethoprim Itching  -COVID-19 vaccine status: Immunization History  Administered Date(s) Administered   Fluad Quad(high Dose 65+) 10/22/2018, 12/07/2019   Influenza Split 11/16/2010   Influenza Whole 11/13/2006, 11/17/2006, 11/13/2007, 11/23/2009   Influenza, High Dose Seasonal PF 10/27/2015, 11/18/2016, 10/11/2020   Influenza, Seasonal, Injecte, Preservative Fre 11/25/2014   Influenza,inj,Quad PF,6+ Mos 11/27/2017   Influenza,trivalent, recombinat, inj, PF 11/10/2013   PFIZER(Purple Top)SARS-COV-2  Vaccination 03/19/2019, 04/13/2019, 12/14/2019   Pneumococcal Conjugate-13 07/06/2014   Pneumococcal Polysaccharide-23 02/21/1999   Pneumococcal-Unspecified 02/21/1999   Td 02/11/1997, 08/19/2008   Tdap 08/11/2008      Past Medical History:  Diagnosis Date   Arthritis    Diverticulitis    Diverticulitis    GERD (gastroesophageal reflux disease)    Heartburn   Hyperlipemia    Hypertension    IBS (irritable bowel syndrome)    Kidney stones     Current Outpatient Medications on File Prior to Visit  Medication Sig Dispense Refill   acetaminophen (TYLENOL) 500 MG tablet Take 1,000 mg by mouth every 6 (six) hours as needed (for pain).     atorvastatin (LIPITOR) 20 MG tablet TAKE 1/2 TABLET BY MOUTH AT BEDTIME 45 tablet 2   Cholecalciferol (VITAMIN D) 50 MCG (2000 UT) CAPS Take 1 capsule by mouth daily.     gabapentin (NEURONTIN) 100 MG capsule TAKE 2 CAPSULES BY MOUTH IN THE MORNING AND 3 CAPSULES AT BEDTIME 450 capsule 3   levothyroxine (SYNTHROID) 50 MCG tablet TAKE 1 TABLET(50 MCG) BY MOUTH DAILY 90 tablet 3   lisinopril (ZESTRIL) 20 MG tablet TAKE 1 TABLET(20 MG) BY MOUTH AT BEDTIME 90 tablet 3   LORazepam (ATIVAN) 1 MG tablet TAKE 1/2 TO 1 TABLET(0.5 TO 1 MG) BY MOUTH AT BEDTIME 30 tablet 5   Multiple Vitamins-Minerals (CENTRUM SILVER 50+MEN) TABS Take 1 tablet by mouth daily with breakfast.     pantoprazole (PROTONIX) 40 MG tablet TAKE 1 TABLET BY MOUTH EVERY DAY 90 tablet 1   tamsulosin (FLOMAX) 0.4 MG CAPS capsule TAKE 1 CAPSULE BY MOUTH AT BEDTIME 90 capsule 3   traMADol (ULTRAM) 50 MG tablet TAKE 1 TABLET(50 MG) BY MOUTH  EVERY 6 HOURS FOR UP TO 5 DAYS AS NEEDED 30 tablet 1   No current facility-administered medications on file prior to visit.    Observations/Objective: Patient sounds cheerful and well on the phone. I do not appreciate any SOB. Speech and thought processing are grossly intact. Patient reported vitals:  Assessment and Plan:  LLQ pain  -we discussed  possible serious and likely etiologies, options for evaluation and workup, limitations of telemedicine visit vs in person visit, treatment, treatment risks and precautions. Pt prefers to treat via telemedicine empirically rather than in person at this moment. Query diverticulitis vs other and advise inperson eval. He prefers to try abx instead as reports has worked well in the past. Management has changed, but they prefer to stick with what has worked. Discussed risks and diet. They do agree to seek prompt in person care if worsening, new symptoms arise, or if is not improving with treatment as expected per our conversation of expected course. Discussed options for follow up care. Did let this patient know that I do telemedicine on Tuesdays and Thursdays for [place] and those are the days I am logged into the system. Advised to schedule follow up visit with PCP, [place] virtual visits or [facility] if any further questions or concerns to avoid delays in care.   I discussed the assessment and treatment plan with the patient. The patient was provided an opportunity to ask questions and all were answered. The patient agreed with the plan and demonstrated an understanding of the instructions.    Follow Up Instructions:  I did not refer this patient for an OV with me in the next 24 hours for this/these issue(s).  I discussed the assessment and treatment plan with the patient. The patient was provided an opportunity to ask questions and all were answered. The patient agreed with the plan and demonstrated an understanding of the instructions.   I spent 19 minutes on the date of this visit in the care of this patient. See summary of tasks completed to properly care for this patient in the detailed notes above which also included counseling of above, review of PMH, medications, allergies, evaluation of the patient and ordering and/or  instructing patient on testing and care options.     Lucretia Kern, DO

## 2021-03-01 NOTE — Telephone Encounter (Signed)
Timothy Garrett is following up on the last call her mother made regarding the patient.  Please advise.

## 2021-03-01 NOTE — Patient Instructions (Signed)
-  I sent the medication(s) we discussed to your pharmacy: Meds ordered this encounter  Medications   ciprofloxacin (CIPRO) 250 MG tablet    Sig: Take 1 tablet (250 mg total) by mouth 2 (two) times daily for 10 days.    Dispense:  20 tablet    Refill:  0   metroNIDAZOLE (FLAGYL) 500 MG tablet    Sig: Take 1 tablet (500 mg total) by mouth 3 (three) times daily for 10 days.    Dispense:  30 tablet    Refill:  0     I hope you are feeling better soon!  Seek in person care promptly if your symptoms worsen, new concerns arise or you are not improving with treatment.  It was nice to meet you today. I help [place] out with telemedicine visits on Tuesdays and Thursdays and am happy to help if you need a virtual follow up visit on those days. Otherwise, if you have any concerns or questions following this visit please schedule a follow up visit with your Primary Care office or seek care at a local urgent care clinic to avoid delays in care

## 2021-03-01 NOTE — Telephone Encounter (Signed)
Patient has been scheduled for a virtual visit with Dr. Maudie Mercury today.

## 2021-03-02 ENCOUNTER — Other Ambulatory Visit (HOSPITAL_BASED_OUTPATIENT_CLINIC_OR_DEPARTMENT_OTHER): Payer: Self-pay

## 2021-03-02 ENCOUNTER — Other Ambulatory Visit: Payer: Self-pay

## 2021-03-02 ENCOUNTER — Emergency Department (HOSPITAL_BASED_OUTPATIENT_CLINIC_OR_DEPARTMENT_OTHER): Payer: Medicare Other

## 2021-03-02 ENCOUNTER — Emergency Department (HOSPITAL_BASED_OUTPATIENT_CLINIC_OR_DEPARTMENT_OTHER)
Admission: EM | Admit: 2021-03-02 | Discharge: 2021-03-02 | Disposition: A | Discharge status: Home / Self Care | Payer: Medicare Other | Attending: Emergency Medicine | Admitting: Emergency Medicine

## 2021-03-02 ENCOUNTER — Encounter (HOSPITAL_BASED_OUTPATIENT_CLINIC_OR_DEPARTMENT_OTHER): Payer: Self-pay | Admitting: *Deleted

## 2021-03-02 DIAGNOSIS — Z20822 Contact with and (suspected) exposure to covid-19: Secondary | ICD-10-CM | POA: Diagnosis not present

## 2021-03-02 DIAGNOSIS — N200 Calculus of kidney: Secondary | ICD-10-CM | POA: Diagnosis not present

## 2021-03-02 DIAGNOSIS — R1032 Left lower quadrant pain: Secondary | ICD-10-CM | POA: Diagnosis present

## 2021-03-02 DIAGNOSIS — E860 Dehydration: Secondary | ICD-10-CM | POA: Insufficient documentation

## 2021-03-02 DIAGNOSIS — R109 Unspecified abdominal pain: Secondary | ICD-10-CM | POA: Diagnosis not present

## 2021-03-02 DIAGNOSIS — I700 Atherosclerosis of aorta: Secondary | ICD-10-CM | POA: Diagnosis not present

## 2021-03-02 DIAGNOSIS — Z79899 Other long term (current) drug therapy: Secondary | ICD-10-CM | POA: Insufficient documentation

## 2021-03-02 DIAGNOSIS — I10 Essential (primary) hypertension: Secondary | ICD-10-CM | POA: Insufficient documentation

## 2021-03-02 LAB — RESP PANEL BY RT-PCR (FLU A&B, COVID) ARPGX2
Influenza A by PCR: NEGATIVE
Influenza B by PCR: NEGATIVE
SARS Coronavirus 2 by RT PCR: NEGATIVE

## 2021-03-02 LAB — URINALYSIS, ROUTINE W REFLEX MICROSCOPIC
Bilirubin Urine: NEGATIVE
Glucose, UA: NEGATIVE mg/dL
Leukocytes,Ua: NEGATIVE
Nitrite: NEGATIVE
Protein, ur: NEGATIVE mg/dL
Specific Gravity, Urine: >1.046 — ABNORMAL HIGH (ref 1.005–1.030)
pH: 5.5 (ref 5.0–8.0)

## 2021-03-02 LAB — COMPREHENSIVE METABOLIC PANEL
ALT: 16 U/L (ref 0–44)
AST: 14 U/L — ABNORMAL LOW (ref 15–41)
Albumin: 4.2 g/dL (ref 3.5–5.0)
Alkaline Phosphatase: 53 U/L (ref 38–126)
Anion gap: 11 (ref 5–15)
BUN: 23 mg/dL (ref 8–23)
CO2: 25 mmol/L (ref 22–32)
Calcium: 9.2 mg/dL (ref 8.9–10.3)
Chloride: 102 mmol/L (ref 98–111)
Creatinine, Ser: 1.48 mg/dL — ABNORMAL HIGH (ref 0.61–1.24)
GFR, Estimated: 46 mL/min — ABNORMAL LOW (ref >60)
Glucose, Bld: 126 mg/dL — ABNORMAL HIGH (ref 70–99)
Potassium: 4.2 mmol/L (ref 3.5–5.1)
Sodium: 138 mmol/L (ref 135–145)
Total Bilirubin: 0.7 mg/dL (ref 0.3–1.2)
Total Protein: 6.5 g/dL (ref 6.5–8.1)

## 2021-03-02 LAB — CBC
HCT: 39 % (ref 39.0–52.0)
Hemoglobin: 12.8 g/dL — ABNORMAL LOW (ref 13.0–17.0)
MCH: 33.4 pg (ref 26.0–34.0)
MCHC: 32.8 g/dL (ref 30.0–36.0)
MCV: 101.8 fL — ABNORMAL HIGH (ref 80.0–100.0)
Platelets: 272 10*3/uL (ref 150–400)
RBC: 3.83 MIL/uL — ABNORMAL LOW (ref 4.22–5.81)
RDW: 13.8 % (ref 11.5–15.5)
WBC: 14.9 10*3/uL — ABNORMAL HIGH (ref 4.0–10.5)
nRBC: 0 % (ref 0.0–0.2)

## 2021-03-02 LAB — LIPASE, BLOOD: Lipase: 12 U/L (ref 11–51)

## 2021-03-02 MED ORDER — MORPHINE SULFATE (PF) 4 MG/ML IV SOLN
4.0000 mg | Freq: Once | INTRAVENOUS | Status: AC
  Administered 2021-03-02: 4 mg via INTRAVENOUS
  Filled 2021-03-02: qty 1

## 2021-03-02 MED ORDER — SODIUM CHLORIDE 0.9 % IV BOLUS
1000.0000 mL | Freq: Once | INTRAVENOUS | Status: AC
  Administered 2021-03-02: 1000 mL via INTRAVENOUS

## 2021-03-02 MED ORDER — IOHEXOL 300 MG/ML  SOLN
80.0000 mL | Freq: Once | INTRAMUSCULAR | Status: AC | PRN
  Administered 2021-03-02: 80 mL via INTRAVENOUS

## 2021-03-02 MED ORDER — KETOROLAC TROMETHAMINE 15 MG/ML IJ SOLN
15.0000 mg | Freq: Once | INTRAMUSCULAR | Status: AC
  Administered 2021-03-02: 15 mg via INTRAVENOUS
  Filled 2021-03-02: qty 1

## 2021-03-02 MED ORDER — ACETAMINOPHEN-CODEINE #3 300-30 MG PO TABS
1.0000 | ORAL_TABLET | Freq: Four times a day (QID) | ORAL | 0 refills | Status: DC | PRN
  Filled 2021-03-02: qty 20, 3d supply, fill #0

## 2021-03-02 MED ORDER — ONDANSETRON HCL 4 MG/2ML IJ SOLN
4.0000 mg | Freq: Once | INTRAMUSCULAR | Status: AC
  Administered 2021-03-02: 4 mg via INTRAVENOUS
  Filled 2021-03-02: qty 2

## 2021-03-02 NOTE — ED Provider Notes (Signed)
Timothy Garrett Provider Note   CSN: [id number] Arrival date & time: 03/02/21  1014     History  Chief Complaint  Patient presents with   Abdominal Pain    Timothy Garrett is a 88 y.o. male.  This is a 88 y.o. male  with significant medical history as below, including diverticulitis, HTN, HLD, IBS who presents to the ED with complaint of abd pain. Pt with LLQ abd pain x3-4 days. Described as aching, cramping, sharp at times. Not radiating. Was seen by pcp via tele visit who started him on abx for presumed diverticulitis. He reports reduced oral intake since symptom onset, ongoing nausea w/o emesis, no change to bowel/bladder fxn. No fevers or chills, no cp or dib. No recent diet changes. He has been taking tramadol and zofran at home for symptomatic control and this has helped his pain and nausea. 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     Past Medical History: No date: Arthritis No date: Diverticulitis No date: Diverticulitis No date: GERD (gastroesophageal reflux disease)     Comment:  Heartburn No date: Hyperlipemia No date: Hypertension No date: IBS (irritable bowel syndrome) No date: Kidney stones      The history is provided by the patient and a relative. No language interpreter was used.  Abdominal Pain Associated symptoms: nausea   Associated symptoms: no chest pain, no chills, no cough, no fever, no hematuria, no shortness of breath and no vomiting       Home Medications Prior to Admission medications   Medication Sig Start Date End Date Taking? Authorizing Provider  acetaminophen-codeine (TYLENOL #3) 300-30 MG tablet Take 1-2 tablets by mouth every 6 (six) hours as needed for moderate pain. 03/02/21  Yes Jeanell Sparrow, DO  acetaminophen (TYLENOL) 500 MG tablet Take 1,000 mg by mouth every 6 (six) hours as needed (for pain).    [provider]  atorvastatin (LIPITOR) 20 MG tablet  TAKE 1/2 TABLET BY MOUTH AT BEDTIME 07/25/20   Burchette, Alinda Sierras, MD  Cholecalciferol (VITAMIN D) 50 MCG (2000 UT) CAPS Take 1 capsule by mouth daily.    [provider]  ciprofloxacin (CIPRO) 250 MG tablet Take 1 tablet (250 mg total) by mouth 2 (two) times daily for 10 days. 03/01/21 03/11/21  Lucretia Kern, DO  gabapentin (NEURONTIN) 100 MG capsule TAKE 2 CAPSULES BY MOUTH IN THE MORNING AND 3 CAPSULES AT BEDTIME 01/17/21   Burchette, Alinda Sierras, MD  levothyroxine (SYNTHROID) 50 MCG tablet TAKE 1 TABLET(50 MCG) BY MOUTH DAILY 10/25/20   Burchette, Alinda Sierras, MD  lisinopril (ZESTRIL) 20 MG tablet TAKE 1 TABLET(20 MG) BY MOUTH AT BEDTIME 10/25/20   Burchette, Alinda Sierras, MD  LORazepam (ATIVAN) 1 MG tablet TAKE 1/2 TO 1 TABLET(0.5 TO 1 MG) BY MOUTH AT BEDTIME 10/25/20   Burchette, Alinda Sierras, MD  metroNIDAZOLE (FLAGYL) 500 MG tablet Take 1 tablet (500 mg total) by mouth 3 (three) times daily for 10 days. 03/01/21 03/11/21  Lucretia Kern, DO  Multiple Vitamins-Minerals (CENTRUM SILVER 50+MEN) TABS Take 1 tablet by mouth daily with breakfast.    [provider]  pantoprazole (PROTONIX) 40 MG tablet TAKE 1 TABLET BY MOUTH EVERY DAY 09/28/20   Burchette, Alinda Sierras, MD  tamsulosin (FLOMAX) 0.4 MG CAPS capsule TAKE 1 CAPSULE BY MOUTH AT BEDTIME 01/22/21   Burchette, Alinda Sierras, MD  traMADol (ULTRAM) 50 MG tablet TAKE 1 TABLET(50 MG)  BY MOUTH EVERY 6 HOURS FOR UP TO 5 DAYS AS NEEDED 02/22/21   Burchette, Alinda Sierras, MD      Allergies    Sulfamethoxazole-trimethoprim    Review of Systems   Review of Systems  Constitutional:  Positive for appetite change. Negative for chills and fever.  HENT:  Negative for facial swelling and trouble swallowing.   Eyes:  Negative for photophobia and visual disturbance.  Respiratory:  Negative for cough and shortness of breath.   Cardiovascular:  Negative for chest pain and palpitations.  Gastrointestinal:  Positive for abdominal pain and nausea. Negative for vomiting.   Endocrine: Negative for polydipsia and polyuria.  Genitourinary:  Negative for difficulty urinating and hematuria.  Musculoskeletal:  Negative for gait problem and joint swelling.  Skin:  Negative for pallor and rash.  Neurological:  Negative for syncope and headaches.  Psychiatric/Behavioral:  Negative for agitation and confusion.    Physical Exam Updated Vital Signs BP 129/68    Pulse 75    Temp (!) 97.4 F (36.3 C) (Oral)    Resp 15    Wt 61.2 kg    SpO2 99%    BMI 21.79 kg/m  Physical Exam Vitals and nursing note reviewed.  Constitutional:      General: He is not in acute distress.    Appearance: He is well-developed.  HENT:     Head: Normocephalic and atraumatic.     Right Ear: External ear normal.     Left Ear: External ear normal.     Mouth/Throat:     Mouth: Mucous membranes are moist.  Eyes:     General: No scleral icterus. Cardiovascular:     Rate and Rhythm: Normal rate and regular rhythm.     Pulses: Normal pulses.     Heart sounds: Normal heart sounds.  Pulmonary:     Effort: Pulmonary effort is normal. No respiratory distress.     Breath sounds: Normal breath sounds.  Abdominal:     General: Abdomen is flat.     Palpations: Abdomen is soft.     Tenderness: There is abdominal tenderness in the left lower quadrant. There is no guarding or rebound.  Musculoskeletal:        General: Normal range of motion.     Cervical back: Normal range of motion.     Right lower leg: No edema.     Left lower leg: No edema.  Skin:    General: Skin is warm and dry.     Capillary Refill: Capillary refill takes less than 2 seconds.  Neurological:     Mental Status: He is alert and oriented to person, place, and time.  Psychiatric:        Mood and Affect: Mood normal.        Behavior: Behavior normal.    ED Results / Procedures / Treatments   Labs (all labs ordered are listed, but only abnormal results are displayed) Labs Reviewed  COMPREHENSIVE METABOLIC PANEL -  Abnormal; Notable for the following components:      Result Value   Glucose, Bld 126 (*)    Creatinine, Ser 1.48 (*)    AST 14 (*)    GFR, Estimated 46 (*)    All other components within normal limits  CBC - Abnormal; Notable for the following components:   WBC 14.9 (*)    RBC 3.83 (*)    Hemoglobin 12.8 (*)    MCV 101.8 (*)    All other components within normal  limits  URINALYSIS, ROUTINE W REFLEX MICROSCOPIC - Abnormal; Notable for the following components:   Specific Gravity, Urine >1.046 (*)    Hgb urine dipstick MODERATE (*)    Ketones, ur TRACE (*)    All other components within normal limits  RESP PANEL BY RT-PCR (FLU A&B, COVID) ARPGX2  LIPASE, BLOOD    EKG None  Radiology CT ABDOMEN PELVIS W CONTRAST  Result Date: 03/02/2021 CLINICAL DATA:  Left lower quadrant pain, worsening, history of diverticulitis, ongoing antibiotic therapy EXAM: CT ABDOMEN AND PELVIS WITH CONTRAST TECHNIQUE: Multidetector CT imaging of the abdomen and pelvis was performed using the standard protocol following bolus administration of intravenous contrast. RADIATION DOSE REDUCTION: This exam was performed according to the departmental dose-optimization program which includes automated exposure control, adjustment of the mA and/or kV according to patient size and/or use of iterative reconstruction technique. CONTRAST:  50mL OMNIPAQUE IOHEXOL 300 MG/ML  SOLN COMPARISON:  07/05/2019 FINDINGS: Lower chest: No acute abnormality. Hepatobiliary: No solid liver abnormality is seen. No gallstones, gallbladder wall thickening, or biliary dilatation. Pancreas: Unremarkable. No pancreatic ductal dilatation or surrounding inflammatory changes. Spleen: Normal in size without significant abnormality. Adrenals/Urinary Tract: Adrenal glands are unremarkable. There is a 0.2 cm calculus in the distal third of the left ureter, just distal to the external iliac artery (series 2, image 59) with mild associated hydronephrosis and  proximal hydroureter as well as perinephric fat stranding about the left kidney. Additional punctuate nonobstructive bilateral renal calculi. There are additional bilateral parapelvic cysts, more numerous on the left. Tiny calculus in the dependent right urinary bladder (series 2, image 66). Stomach/Bowel: Stomach is within normal limits. Appendix appears normal. No evidence of bowel wall thickening, distention, or inflammatory changes. Descending and sigmoid diverticulosis. Vascular/Lymphatic: Aortic atherosclerosis. No enlarged abdominal or pelvic lymph nodes. Reproductive: No mass or other significant abnormality. Other: No abdominal wall hernia or abnormality. No ascites. Musculoskeletal: No acute or significant osseous findings. IMPRESSION: 1. There is a 0.2 cm calculus in the distal third of the left ureter, just distal to the external iliac artery with mild associated hydronephrosis and proximal hydroureter as well as perinephric fat stranding about the left kidney. 2. Additional punctuate nonobstructive bilateral renal calculi. 3. Tiny calculus in the dependent right urinary bladder. 4. Descending and sigmoid diverticulosis without evidence of acute diverticulitis. Aortic Atherosclerosis (ICD10-I70.0). Electronically Signed   By: Delanna Ahmadi M.D.   On: 03/02/2021 12:35    Procedures .Critical Care Performed by: Jeanell Sparrow, DO Authorized by: Jeanell Sparrow, DO   Critical care provider statement:    Critical care time (minutes):  30   Critical care time was exclusive of:  Separately billable procedures and treating other patients   Critical care was necessary to treat or prevent imminent or life-threatening deterioration of the following conditions:  Dehydration   Critical care was time spent personally by me on the following activities:  Development of treatment plan with patient or surrogate, discussions with consultants, evaluation of patient's response to treatment, examination of patient,  ordering and review of laboratory studies, ordering and review of radiographic studies, ordering and performing treatments and interventions, pulse oximetry, re-evaluation of patient's condition and review of old charts    Medications Ordered in ED Medications  sodium chloride 0.9 % bolus 1,000 mL (0 mLs Intravenous Stopped 03/02/21 1429)  morphine 4 MG/ML injection 4 mg (4 mg Intravenous Given 03/02/21 1205)  ondansetron (ZOFRAN) injection 4 mg (4 mg Intravenous Given 03/02/21 1207)  iohexol (OMNIPAQUE)  300 MG/ML solution 80 mL (80 mLs Intravenous Contrast Given 03/02/21 1216)  ketorolac (TORADOL) 15 MG/ML injection 15 mg (15 mg Intravenous Given 03/02/21 1419)    ED Course/ Medical Decision Making/ A&P                           Medical Decision Making Amount and/or Complexity of Data Reviewed Labs: ordered. Radiology: ordered.  Risk Prescription drug management.    CC: abd pain  This patient complains of above; this involves an extensive number of treatment options and is a complaint that carries with it a high risk of complications and morbidity. Vital signs were reviewed. Serious etiologies considered.  Record review:   Previous records obtained and reviewed   Additional history obtained from family at bedside  Work up as above, notable for:  Labs & imaging results that were available during my care of the patient were reviewed by me and considered in my medical decision making.   I ordered imaging studies which included CTAP and I independently visualized and interpreted imaging which showed nephropathy  Cardiac monitoring reviewed and interpreted personally which shows NSR  Labs with mild renal insuffiencey, pt with poor po intake last few days since symptom onset. Will give IVF.   Social determinants of health include - none  Management: Ivf, analgesics  Reassessment:  Pt reports pain greatly improved, he is sitting up in bed comfortably, interactive. No n/v.  Tolerating PO. Discussed CT findings of kidney stone, he has had these in the past. This one should likely pass spontaneously. He already takes flomax. Advised him to continue rehydration as mild dehydration noted today, will add analgesics for home, follow up with PCP and with urology if symptoms continue  No diverticulitis on CT  Advised him to stop antibiotics that were started by pcp   The patient improved significantly and was discharged in stable condition. Detailed discussions were had with the patient regarding current findings, and need for close f/u with PCP or on call doctor. The patient has been instructed to return immediately if the symptoms worsen in any way for re-evaluation. Patient verbalized understanding and is in agreement with current care plan. All questions answered prior to discharge.           This chart was dictated using voice recognition software.  Despite best efforts to proofread,  errors can occur which can change the documentation meaning.         Final Clinical Impression(s) / ED Diagnoses Final diagnoses:  Nephrolithiasis  Mild dehydration    Rx / DC Orders ED Discharge Orders          Ordered    acetaminophen-codeine (TYLENOL #3) 300-30 MG tablet  Every 6 hours PRN        03/02/21 1549              Jeanell Sparrow, DO 03/03/21 1306

## 2021-03-02 NOTE — Discharge Instructions (Addendum)
Continue taking your flomax  Follow-up with urology in 1 week if still having symptoms  It was a pleasure caring for you today in the emergency department.  Please return to the emergency department for any worsening or worrisome symptoms.

## 2021-03-02 NOTE — ED Triage Notes (Signed)
Pt is here for abdominal pain.  Pt has hx of diverticulitis and feels that this is a flare up.  Pt was placed on antibiotic by PCP yesterday but his symptoms became worse and he wanted to come be reevaluated. Pt has had 2 doses of his cipro and flagyl po.  Pt has been nauseated no fever.

## 2021-03-06 ENCOUNTER — Ambulatory Visit: Payer: Medicare Other | Admitting: Family Medicine

## 2021-03-06 VITALS — BP 120/60 | HR 84 | Temp 97.5°F | Wt 130.6 lb

## 2021-03-06 DIAGNOSIS — N200 Calculus of kidney: Secondary | ICD-10-CM | POA: Diagnosis not present

## 2021-03-06 DIAGNOSIS — L570 Actinic keratosis: Secondary | ICD-10-CM | POA: Diagnosis not present

## 2021-03-06 DIAGNOSIS — R1032 Left lower quadrant pain: Secondary | ICD-10-CM

## 2021-03-06 MED ORDER — ACETAMINOPHEN-CODEINE #3 300-30 MG PO TABS: 1.0000 | ORAL_TABLET | Freq: Four times a day (QID) | ORAL | 0 refills | Status: DC | PRN

## 2021-03-06 NOTE — Progress Notes (Signed)
Established Patient Office Visit  Subjective:  Patient ID: Timothy Garrett, male    DOB: 1931/12/29  Age: 88 y.o. MRN: [id number]  CC:  Chief Complaint  Patient presents with   Hospitalization Follow-up    HPI [name] presents for recent ER follow-up.  He had developed some left lower quadrant abdominal pain.  He has had recurrent diverticulitis in the past and initially thought this was diverticulitis.  He set up virtual visit and was prescribed Flagyl and Cipro but his pain continued to worsen.  No recent reported fevers.  He had fairly severe pain which was sharp and up to 10 out of 10 in severity at times.  No gross hematuria.  He went to the ER and CT scan confirmed 2 mm distal left ureter stone.  He states he had at least 3 stones previously.  Scan also showed additional punctate nonobstructive bilateral renal calculi and tiny calculus in the dependent right urinary bladder.  He did have evidence for descending and sigmoid diverticulosis without evidence of acute diverticulitis.  Patient was given Toradol and morphine in the ER along with IV fluids and outpatient Tylenol 3.  He feels like his pain eased up substantially sometime this morning around 630 after taking some ibuprofen.  He does take Flomax 0.4 mg once daily at baseline.  He has had very little pain through the day.  Only has 1 Tylenol 3 tablet left.  No gross hematuria.  No fever.  Still has some mild discomfort left lower quadrant but overall improving.  He does have a strainer and has not passed a stone to his knowledge thus far.  He does plan to continue to strain his urine  Creatinine was up slightly at 1.4 which is over his usual baseline around 1.2.  Patient relates separate issue of scaly hyperkeratotic lesion on his mid parietal scalp.  Nonpainful.  Sometimes catches on clothing.  He would like to have this treated.  He had similar lesion treated with liquid nitrogen in the past which  resolved.    Past Medical History:  Diagnosis Date   Arthritis    Diverticulitis    Diverticulitis    GERD (gastroesophageal reflux disease)    Heartburn   Hyperlipemia    Hypertension    IBS (irritable bowel syndrome)    Kidney stones     Past Surgical History:  Procedure Laterality Date   BACK SURGERY     EYE SURGERY  2016   cataract surgery   TONSILLECTOMY  1941   ulcer surgery      Family History  Problem Relation Age of Onset   Stomach cancer Mother    Hypertension Mother    Heart disease Father    Heart attack Father    Heart attack Sister    Stroke Maternal Grandfather    Heart attack Paternal Grandfather     Social History   Socioeconomic History   Marital status: Married    Spouse name: Not on file   Number of children: 2   Years of education: Not on file   Highest education level: Not on file  Occupational History   Occupation: Medical illustrator    Comment: Retired  Tobacco Use   Smoking status: Former   Smokeless tobacco: Never   Tobacco comments:    Quit 60 years ago  Scientific laboratory technician Use: Never used  Substance and Sexual Activity   Alcohol use: No   Drug use: No  Sexual activity: Not Currently  Other Topics Concern   Not on file  Social History Narrative   Lives with wife on one level condo, recently downsized   Has two children, local, 4 grandchildren   Has one dog, Sam, enjoys walking it, but no other regular exercise   Enjoys doing Haematologist, Event organiser   Social Determinants of Radio broadcast assistant Strain: Not on file  Food Insecurity: Not on file  Transportation Needs: Not on file  Physical Activity: Not on file  Stress: Not on file  Social Connections: Not on file  Intimate Partner Violence: Not on file    Outpatient Medications Prior to Visit  Medication Sig Dispense Refill   acetaminophen (TYLENOL) 500 MG tablet Take 1,000 mg by mouth every 6 (six) hours as needed (for pain).     atorvastatin (LIPITOR) 20  MG tablet TAKE 1/2 TABLET BY MOUTH AT BEDTIME 45 tablet 2   Cholecalciferol (VITAMIN D) 50 MCG (2000 UT) CAPS Take 1 capsule by mouth daily.     gabapentin (NEURONTIN) 100 MG capsule TAKE 2 CAPSULES BY MOUTH IN THE MORNING AND 3 CAPSULES AT BEDTIME 450 capsule 3   levothyroxine (SYNTHROID) 50 MCG tablet TAKE 1 TABLET(50 MCG) BY MOUTH DAILY 90 tablet 3   lisinopril (ZESTRIL) 20 MG tablet TAKE 1 TABLET(20 MG) BY MOUTH AT BEDTIME 90 tablet 3   LORazepam (ATIVAN) 1 MG tablet TAKE 1/2 TO 1 TABLET(0.5 TO 1 MG) BY MOUTH AT BEDTIME 30 tablet 5   Multiple Vitamins-Minerals (CENTRUM SILVER 50+MEN) TABS Take 1 tablet by mouth daily with breakfast.     pantoprazole (PROTONIX) 40 MG tablet TAKE 1 TABLET BY MOUTH EVERY DAY 90 tablet 1   tamsulosin (FLOMAX) 0.4 MG CAPS capsule TAKE 1 CAPSULE BY MOUTH AT BEDTIME 90 capsule 3   traMADol (ULTRAM) 50 MG tablet TAKE 1 TABLET(50 MG) BY MOUTH EVERY 6 HOURS FOR UP TO 5 DAYS AS NEEDED 30 tablet 1   acetaminophen-codeine (TYLENOL #3) 300-30 MG tablet Take 1-2 tablets by mouth every 6 (six) hours as needed for moderate pain. 20 tablet 0   ciprofloxacin (CIPRO) 250 MG tablet Take 1 tablet (250 mg total) by mouth 2 (two) times daily for 10 days. 20 tablet 0   metroNIDAZOLE (FLAGYL) 500 MG tablet Take 1 tablet (500 mg total) by mouth 3 (three) times daily for 10 days. 30 tablet 0   No facility-administered medications prior to visit.    Allergies  Allergen Reactions   Sulfamethoxazole-Trimethoprim Itching    ROS Review of Systems  Constitutional:  Negative for chills and fever.  Gastrointestinal:  Positive for abdominal pain. Negative for nausea and vomiting.  Genitourinary:  Negative for difficulty urinating, dysuria, frequency and hematuria.     Objective:    Physical Exam Vitals reviewed.  Constitutional:      Appearance: Normal appearance.  Cardiovascular:     Rate and Rhythm: Normal rate and regular rhythm.  Pulmonary:     Effort: Pulmonary effort is  normal.     Breath sounds: Normal breath sounds.  Abdominal:     Palpations: Abdomen is soft.     Comments: Minimal tenderness left lower quadrant to deep palpation.  No guarding or rebound.  Neurological:     Mental Status: He is alert.    BP 120/60 (BP Location: Left Arm, Patient Position: Sitting, Cuff Size: Normal)    Pulse 84    Temp (!) 97.5 F (36.4 C) (Oral)    Wt 130 lb  9.6 oz (59.2 kg)    SpO2 98%    BMI 21.08 kg/m  Wt Readings from Last 3 Encounters:  03/06/21 130 lb 9.6 oz (59.2 kg)  03/02/21 135 lb (61.2 kg)  01/17/21 135 lb 14.4 oz (61.6 kg)     Health Maintenance Due  Topic Date Due   Zoster Vaccines- Shingrix (1 of 2) Never done    There are no preventive care reminders to display for this patient.  Lab Results  Component Value Date   TSH 0.97 10/25/2020   Lab Results  Component Value Date   WBC 14.9 (H) 03/02/2021   HGB 12.8 (L) 03/02/2021   HCT 39.0 03/02/2021   MCV 101.8 (H) 03/02/2021   PLT 272 03/02/2021   Lab Results  Component Value Date   NA 138 03/02/2021   K 4.2 03/02/2021   CO2 25 03/02/2021   GLUCOSE 126 (H) 03/02/2021   BUN 23 03/02/2021   CREATININE 1.48 (H) 03/02/2021   BILITOT 0.7 03/02/2021   ALKPHOS 53 03/02/2021   AST 14 (L) 03/02/2021   ALT 16 03/02/2021   PROT 6.5 03/02/2021   ALBUMIN 4.2 03/02/2021   CALCIUM 9.2 03/02/2021   ANIONGAP 11 03/02/2021   GFR 50.56 (L) 10/25/2020   Lab Results  Component Value Date   CHOL 155 10/25/2020   Lab Results  Component Value Date   HDL 71.80 10/25/2020   Lab Results  Component Value Date   LDLCALC 68 10/25/2020   Lab Results  Component Value Date   TRIG 76.0 10/25/2020   Lab Results  Component Value Date   CHOLHDL 2 10/25/2020   No results found for: HGBA1C    Assessment & Plan:   #1 recent abdominal pain left lower quadrant.  CT scan showed small distal left ureter kidney stone.  Pain improved over the past several hours and hopefully this has transitioned into  the bladder.  He does have a strainer still.  We have encouraged him to bring in the stone if he collects this over the next day or so.  Continue good hydration.  Continue Flomax.  Refill Tylenol 3 to have on hand if he has any recurrent severe pain.  If he has any recurrent severe pain will recommend urology referral  #2 actinic keratosis parietal scalp.  Patient requesting treatment.  We discussed risk of treatment cryotherapy with pain, blistering, low risk of infection.  We treated this with a few cycles of freeze-and-thaw and patient tolerated well.  If this is not fully resolving over the next few weeks recommend follow-up for biopsy.   Meds ordered this encounter  Medications   acetaminophen-codeine (TYLENOL #3) 300-30 MG tablet    Sig: Take 1-2 tablets by mouth every 6 (six) hours as needed for moderate pain.    Dispense:  20 tablet    Refill:  0    Follow-up: No follow-ups on file.    Carolann Littler, MD

## 2021-03-12 DIAGNOSIS — M5416 Radiculopathy, lumbar region: Secondary | ICD-10-CM | POA: Diagnosis not present

## 2021-03-19 DIAGNOSIS — M545 Low back pain, unspecified: Secondary | ICD-10-CM | POA: Diagnosis not present

## 2021-03-19 DIAGNOSIS — M5416 Radiculopathy, lumbar region: Secondary | ICD-10-CM | POA: Diagnosis not present

## 2021-03-27 ENCOUNTER — Other Ambulatory Visit: Payer: Self-pay | Admitting: Family Medicine

## 2021-03-27 DIAGNOSIS — M5416 Radiculopathy, lumbar region: Secondary | ICD-10-CM | POA: Diagnosis not present

## 2021-03-27 DIAGNOSIS — M545 Low back pain, unspecified: Secondary | ICD-10-CM | POA: Diagnosis not present

## 2021-03-30 ENCOUNTER — Ambulatory Visit: Payer: Medicare Other | Admitting: Family Medicine

## 2021-03-30 VITALS — BP 120/58 | HR 84 | Temp 98.2°F | Wt 134.3 lb

## 2021-03-30 DIAGNOSIS — L570 Actinic keratosis: Secondary | ICD-10-CM | POA: Diagnosis not present

## 2021-03-30 DIAGNOSIS — N200 Calculus of kidney: Secondary | ICD-10-CM

## 2021-03-30 DIAGNOSIS — R202 Paresthesia of skin: Secondary | ICD-10-CM

## 2021-03-30 DIAGNOSIS — R531 Weakness: Secondary | ICD-10-CM | POA: Diagnosis not present

## 2021-03-30 NOTE — Progress Notes (Signed)
Established Patient Office Visit  Subjective:  Patient ID: Timothy Garrett, male    DOB: Jul 10, 1931  Age: 88 y.o. MRN: [id number]  CC:  Chief Complaint  Patient presents with   Follow-up    HPI Timothy Garrett presents for follow-up regarding several items  Recent large keratosis on the mid parietal area.  This was treated with liquid nitrogen.  Large piece of this has fallen off from treatment 3 weeks ago..  Still has a little bit of residual scaling near the base but overall much improved.  He had recent kidney stone.  He did eventually pass this and he brings in the stone today.  He states this is at least his fourth stone.  We had discussed possible stone analysis to confirm but suspect probably calcium oxalate stones.  He takes tamsulosin regularly at baseline.  Is trying to do a better job reducing sodium intake and increasing hydration.  Ongoing numbness and weakness left leg.  Recent MRI lumbar spine showed severe multilevel lumbar stenosis.  Also disc extrusion fragment L4-L5 compressing L4 nerve root.  Patient states his back pain is improved and almost resolved at this time.  He did have one back injection per neurosurgery but felt that did not help much.   Surprisingly, ambulating fairly well without pain.  Getting physical therapy for his left lower extremity weakness.  He did have a fall Sunday after he got up off the couch and left leg gave way and he fell.  No other injuries reported.  Past Medical History:  Diagnosis Date   Arthritis    Diverticulitis    Diverticulitis    GERD (gastroesophageal reflux disease)    Heartburn   Hyperlipemia    Hypertension    IBS (irritable bowel syndrome)    Kidney stones     Past Surgical History:  Procedure Laterality Date   BACK SURGERY     EYE SURGERY  2016   cataract surgery   TONSILLECTOMY  1941   ulcer surgery      Family History  Problem Relation Age of Onset   Stomach cancer Mother     Hypertension Mother    Heart disease Father    Heart attack Father    Heart attack Sister    Stroke Maternal Grandfather    Heart attack Paternal Grandfather     Social History   Socioeconomic History   Marital status: Married    Spouse name: Not on file   Number of children: 2   Years of education: Not on file   Highest education level: Not on file  Occupational History   Occupation: Medical illustrator    Comment: Retired  Tobacco Use   Smoking status: Former   Smokeless tobacco: Never   Tobacco comments:    Quit 60 years ago  Scientific laboratory technician Use: Never used  Substance and Sexual Activity   Alcohol use: No   Drug use: No   Sexual activity: Not Currently  Other Topics Concern   Not on file  Social History Narrative   Lives with wife on one level condo, recently downsized   Has two children, local, 4 grandchildren   Has one dog, Sam, enjoys walking it, but no other regular exercise   Enjoys doing Haematologist, Event organiser   Social Determinants of Radio broadcast assistant Strain: Not on file  Food Insecurity: Not on file  Transportation Needs: Not on file  Physical Activity: Not on file  Stress: Not  on file  Social Connections: Not on file  Intimate Partner Violence: Not on file    Outpatient Medications Prior to Visit  Medication Sig Dispense Refill   acetaminophen (TYLENOL) 500 MG tablet Take 1,000 mg by mouth every 6 (six) hours as needed (for pain).     acetaminophen-codeine (TYLENOL #3) 300-30 MG tablet Take 1-2 tablets by mouth every 6 (six) hours as needed for moderate pain. 20 tablet 0   atorvastatin (LIPITOR) 20 MG tablet TAKE 1/2 TABLET BY MOUTH AT BEDTIME 45 tablet 2   Cholecalciferol (VITAMIN D) 50 MCG (2000 UT) CAPS Take 1 capsule by mouth daily.     gabapentin (NEURONTIN) 100 MG capsule TAKE 2 CAPSULES BY MOUTH IN THE MORNING AND 3 CAPSULES AT BEDTIME 450 capsule 3   levothyroxine (SYNTHROID) 50 MCG tablet TAKE 1 TABLET(50  MCG) BY MOUTH DAILY 90 tablet 3   lisinopril (ZESTRIL) 20 MG tablet TAKE 1 TABLET(20 MG) BY MOUTH AT BEDTIME 90 tablet 3   LORazepam (ATIVAN) 1 MG tablet TAKE 1/2 TO 1 TABLET(0.5 TO 1 MG) BY MOUTH AT BEDTIME 30 tablet 5   Multiple Vitamins-Minerals (CENTRUM SILVER 50+MEN) TABS Take 1 tablet by mouth daily with breakfast.     pantoprazole (PROTONIX) 40 MG tablet TAKE 1 TABLET BY MOUTH EVERY DAY 90 tablet 1   tamsulosin (FLOMAX) 0.4 MG CAPS capsule TAKE 1 CAPSULE BY MOUTH AT BEDTIME 90 capsule 3   traMADol (ULTRAM) 50 MG tablet TAKE 1 TABLET(50 MG) BY MOUTH EVERY 6 HOURS FOR UP TO 5 DAYS AS NEEDED 30 tablet 1   No facility-administered medications prior to visit.    Allergies  Allergen Reactions   Sulfamethoxazole-Trimethoprim Itching    ROS Review of Systems  Constitutional:  Negative for appetite change, chills, fever and unexpected weight change.  Respiratory:  Negative for shortness of breath.   Genitourinary:  Negative for dysuria, flank pain and hematuria.  Neurological:  Positive for weakness and numbness.     Objective:    Physical Exam Vitals reviewed.  Constitutional:      Appearance: Normal appearance.  Cardiovascular:     Rate and Rhythm: Normal rate and regular rhythm.  Pulmonary:     Effort: Pulmonary effort is normal.     Breath sounds: Normal breath sounds.  Musculoskeletal:     Right lower leg: No edema.     Left lower leg: No edema.  Skin:    Comments: Mid parietal scalp reveals small residual hyperkeratotic area without ulceration.  This is approximately 3 x 4 mm  Neurological:     Mental Status: He is alert.    BP (!) 120/58 (BP Location: Left Arm, Patient Position: Sitting, Cuff Size: Normal)    Pulse 84    Temp 98.2 F (36.8 C) (Oral)    Wt 134 lb 4.8 oz (60.9 kg)    SpO2 98%    BMI 21.68 kg/m  Wt Readings from Last 3 Encounters:  03/30/21 134 lb 4.8 oz (60.9 kg)  03/06/21 130 lb 9.6 oz (59.2 kg)  03/02/21 135 lb (61.2 kg)     Health  Maintenance Due  Topic Date Due   Zoster Vaccines- Shingrix (1 of 2) Never done   COVID-19 Vaccine (4 - Booster for Pfizer series) 02/08/2020    There are no preventive care reminders to display for this patient.  Lab Results  Component Value Date   TSH 0.97 10/25/2020   Lab Results  Component Value Date   WBC 14.9 (H) 03/02/2021  HGB 12.8 (L) 03/02/2021   HCT 39.0 03/02/2021   MCV 101.8 (H) 03/02/2021   PLT 272 03/02/2021   Lab Results  Component Value Date   NA 138 03/02/2021   K 4.2 03/02/2021   CO2 25 03/02/2021   GLUCOSE 126 (H) 03/02/2021   BUN 23 03/02/2021   CREATININE 1.48 (H) 03/02/2021   BILITOT 0.7 03/02/2021   ALKPHOS 53 03/02/2021   AST 14 (L) 03/02/2021   ALT 16 03/02/2021   PROT 6.5 03/02/2021   ALBUMIN 4.2 03/02/2021   CALCIUM 9.2 03/02/2021   ANIONGAP 11 03/02/2021   GFR 50.56 (L) 10/25/2020   Lab Results  Component Value Date   CHOL 155 10/25/2020   Lab Results  Component Value Date   HDL 71.80 10/25/2020   Lab Results  Component Value Date   LDLCALC 68 10/25/2020   Lab Results  Component Value Date   TRIG 76.0 10/25/2020   Lab Results  Component Value Date   CHOLHDL 2 10/25/2020   No results found for: HGBA1C    Assessment & Plan:   #1 recurrent kidney stones.  Suspect probably calcium oxalate stones.  He brings in stone today from recent strainer and this will be sent off for analysis to confirm.  We discussed general measures including adequate hydration and watching his sodium intake.  Can give more specific advice after stone analysis back  #2 actinic keratosis parietal scalp.  He had a large actinic keratosis of the scalp which has mostly fallen off following a liquid nitrogen.  We did discuss retreatment today for small residual area of scaliness.  Patient consented and this was treated without difficulty and patient tolerated well  #3 left leg weakness and numbness probably related to severe spinal stenosis.  Continue  physical therapy   No orders of the defined types were placed in this encounter.   Follow-up: No follow-ups on file.    Carolann Littler, MD

## 2021-04-05 DIAGNOSIS — M545 Low back pain, unspecified: Secondary | ICD-10-CM | POA: Diagnosis not present

## 2021-04-05 DIAGNOSIS — M5416 Radiculopathy, lumbar region: Secondary | ICD-10-CM | POA: Diagnosis not present

## 2021-04-09 DIAGNOSIS — M5416 Radiculopathy, lumbar region: Secondary | ICD-10-CM | POA: Diagnosis not present

## 2021-04-10 DIAGNOSIS — M5416 Radiculopathy, lumbar region: Secondary | ICD-10-CM | POA: Diagnosis not present

## 2021-04-10 DIAGNOSIS — M545 Low back pain, unspecified: Secondary | ICD-10-CM | POA: Diagnosis not present

## 2021-04-12 DIAGNOSIS — M5416 Radiculopathy, lumbar region: Secondary | ICD-10-CM | POA: Diagnosis not present

## 2021-04-12 DIAGNOSIS — M545 Low back pain, unspecified: Secondary | ICD-10-CM | POA: Diagnosis not present

## 2021-04-16 DIAGNOSIS — M5416 Radiculopathy, lumbar region: Secondary | ICD-10-CM | POA: Diagnosis not present

## 2021-04-16 DIAGNOSIS — M545 Low back pain, unspecified: Secondary | ICD-10-CM | POA: Diagnosis not present

## 2021-04-19 DIAGNOSIS — M545 Low back pain, unspecified: Secondary | ICD-10-CM | POA: Diagnosis not present

## 2021-04-19 DIAGNOSIS — M5416 Radiculopathy, lumbar region: Secondary | ICD-10-CM | POA: Diagnosis not present

## 2021-04-21 ENCOUNTER — Other Ambulatory Visit: Payer: Self-pay | Admitting: Family Medicine

## 2021-04-23 DIAGNOSIS — M5416 Radiculopathy, lumbar region: Secondary | ICD-10-CM | POA: Diagnosis not present

## 2021-04-23 DIAGNOSIS — M545 Low back pain, unspecified: Secondary | ICD-10-CM | POA: Diagnosis not present

## 2021-04-27 DIAGNOSIS — M545 Low back pain, unspecified: Secondary | ICD-10-CM | POA: Diagnosis not present

## 2021-04-27 DIAGNOSIS — M5416 Radiculopathy, lumbar region: Secondary | ICD-10-CM | POA: Diagnosis not present

## 2021-04-30 DIAGNOSIS — M5416 Radiculopathy, lumbar region: Secondary | ICD-10-CM | POA: Diagnosis not present

## 2021-04-30 DIAGNOSIS — M545 Low back pain, unspecified: Secondary | ICD-10-CM | POA: Diagnosis not present

## 2021-05-04 DIAGNOSIS — M5416 Radiculopathy, lumbar region: Secondary | ICD-10-CM | POA: Diagnosis not present

## 2021-05-04 DIAGNOSIS — M545 Low back pain, unspecified: Secondary | ICD-10-CM | POA: Diagnosis not present

## 2021-05-07 DIAGNOSIS — M5416 Radiculopathy, lumbar region: Secondary | ICD-10-CM | POA: Diagnosis not present

## 2021-05-07 DIAGNOSIS — M545 Low back pain, unspecified: Secondary | ICD-10-CM | POA: Diagnosis not present

## 2021-05-11 DIAGNOSIS — M5416 Radiculopathy, lumbar region: Secondary | ICD-10-CM | POA: Diagnosis not present

## 2021-05-11 DIAGNOSIS — M545 Low back pain, unspecified: Secondary | ICD-10-CM | POA: Diagnosis not present

## 2021-05-14 DIAGNOSIS — M5416 Radiculopathy, lumbar region: Secondary | ICD-10-CM | POA: Diagnosis not present

## 2021-05-14 DIAGNOSIS — M545 Low back pain, unspecified: Secondary | ICD-10-CM | POA: Diagnosis not present

## 2021-05-21 DIAGNOSIS — M545 Low back pain, unspecified: Secondary | ICD-10-CM | POA: Diagnosis not present

## 2021-05-21 DIAGNOSIS — M5416 Radiculopathy, lumbar region: Secondary | ICD-10-CM | POA: Diagnosis not present

## 2021-05-28 DIAGNOSIS — M5416 Radiculopathy, lumbar region: Secondary | ICD-10-CM | POA: Diagnosis not present

## 2021-05-28 DIAGNOSIS — M545 Low back pain, unspecified: Secondary | ICD-10-CM | POA: Diagnosis not present

## 2021-05-31 DIAGNOSIS — M545 Low back pain, unspecified: Secondary | ICD-10-CM | POA: Diagnosis not present

## 2021-05-31 DIAGNOSIS — M5416 Radiculopathy, lumbar region: Secondary | ICD-10-CM | POA: Diagnosis not present

## 2021-06-04 DIAGNOSIS — M545 Low back pain, unspecified: Secondary | ICD-10-CM | POA: Diagnosis not present

## 2021-06-04 DIAGNOSIS — M5416 Radiculopathy, lumbar region: Secondary | ICD-10-CM | POA: Diagnosis not present

## 2021-07-02 DIAGNOSIS — M5416 Radiculopathy, lumbar region: Secondary | ICD-10-CM | POA: Diagnosis not present

## 2021-07-18 ENCOUNTER — Ambulatory Visit (INDEPENDENT_AMBULATORY_CARE_PROVIDER_SITE_OTHER): Payer: Medicare Other

## 2021-07-18 VITALS — BP 120/60 | HR 69 | Temp 98.2°F | Ht 66.0 in | Wt 136.0 lb

## 2021-07-18 DIAGNOSIS — Z0000 Encounter for general adult medical examination without abnormal findings: Secondary | ICD-10-CM

## 2021-07-18 NOTE — Patient Instructions (Addendum)
Timothy Garrett , Thank you for taking time to come for your Medicare Wellness Visit. I appreciate your ongoing commitment to your health goals. Please review the following plan we discussed and let me know if I can assist you in the future.   These are the goals we discussed:  Goals       Patient Stated (pt-stated)      Maintain independence.         This is a list of the screening recommended for you and due dates:  Health Maintenance  Topic Date Due   Zoster (Shingles) Vaccine (1 of 2) 10/18/2021*   Tetanus Vaccine  07/19/2022*   Flu Shot  09/11/2021   Pneumonia Vaccine  Completed   HPV Vaccine  Aged Out   COVID-19 Vaccine  Discontinued  *Topic was postponed. The date shown is not the original due date.   Opioid Pain Medicine Management Opioids are powerful medicines that are used to treat moderate to severe pain. When used for short periods of time, they can help you to: Sleep better. Do better in physical or occupational therapy. Feel better in the first few days after an injury. Recover from surgery. Opioids should be taken with the supervision of a trained health care provider. They should be taken for the shortest period of time possible. This is because opioids can be addictive, and the longer you take opioids, the greater your risk of addiction. This addiction can also be called opioid use disorder. What are the risks? Using opioid pain medicines for longer than 3 days increases your risk of side effects. Side effects include: Constipation. Nausea and vomiting. Breathing difficulties (respiratory depression). Drowsiness. Confusion. Opioid use disorder. Itching. Taking opioid pain medicine for a long period of time can affect your ability to do daily tasks. It also puts you at risk for: Motor vehicle crashes. Depression. Suicide. Heart attack. Overdose, which can be life-threatening. What is a pain treatment plan? A pain treatment plan is an agreement between you  and your health care provider. Pain is unique to each person, and treatments vary depending on your condition. To manage your pain, you and your health care provider need to work together. To help you do this: Discuss the goals of your treatment, including how much pain you might expect to have and how you will manage the pain. Review the risks and benefits of taking opioid medicines. Remember that a good treatment plan uses more than one approach and minimizes the chance of side effects. Be honest about the amount of medicines you take and about any drug or alcohol use. Get pain medicine prescriptions from only one health care provider. Pain can be managed with many types of alternative treatments. Ask your health care provider to refer you to one or more specialists who can help you manage pain through: Physical or occupational therapy. Counseling (cognitive behavioral therapy). Good nutrition. Biofeedback. Massage. Meditation. Non-opioid medicine. Following a gentle exercise program. How to use opioid pain medicine Taking medicine Take your pain medicine exactly as told by your health care provider. Take it only when you need it. If your pain gets less severe, you may take less than your prescribed dose if your health care provider approves. If you are not having pain, do nottake pain medicine unless your health care provider tells you to take it. If your pain is severe, do nottry to treat it yourself by taking more pills than instructed on your prescription. Contact your health care provider for help.  Write down the times when you take your pain medicine. It is easy to become confused while on pain medicine. Writing the time can help you avoid overdose. Take other over-the-counter or prescription medicines only as told by your health care provider. Keeping yourself and others safe  While you are taking opioid pain medicine: Do not drive, use machinery, or power tools. Do not sign legal  documents. Do not drink alcohol. Do not take sleeping pills. Do not supervise children by yourself. Do not do activities that require climbing or being in high places. Do not go to a lake, river, ocean, spa, or swimming pool. Do not share your pain medicine with anyone. Keep pain medicine in a locked cabinet or in a secure area where pets and children cannot reach it. Stopping your use of opioids If you have been taking opioid medicine for more than a few weeks, you may need to slowly decrease (taper) how much you take until you stop completely. Tapering your use of opioids can decrease your risk of symptoms of withdrawal, such as: Pain and cramping in the abdomen. Nausea. Sweating. Sleepiness. Restlessness. Uncontrollable shaking (tremors). Cravings for the medicine. Do not attempt to taper your use of opioids on your own. Talk with your health care provider about how to do this. Your health care provider may prescribe a step-down schedule based on how much medicine you are taking and how long you have been taking it. Getting rid of leftover pills Do not save any leftover pills. Get rid of leftover pills safely by: Taking the medicine to a prescription take-back program. This is usually offered by the county or law enforcement. Bringing them to a pharmacy that has a drug disposal container. Flushing them down the toilet. Check the label or package insert of your medicine to see whether this is safe to do. Throwing them out in the trash. Check the label or package insert of your medicine to see whether this is safe to do. If it is safe to throw it out, remove the medicine from the original container, put it into a sealable bag or container, and mix it with used coffee grounds, food scraps, dirt, or cat litter before putting it in the trash. Follow these instructions at home: Activity Do exercises as told by your health care provider. Avoid activities that make your pain worse. Return to  your normal activities as told by your health care provider. Ask your health care provider what activities are safe for you. General instructions You may need to take these actions to prevent or treat constipation: Drink enough fluid to keep your urine pale yellow. Take over-the-counter or prescription medicines. Eat foods that are high in fiber, such as beans, whole grains, and fresh fruits and vegetables. Limit foods that are high in fat and processed sugars, such as fried or sweet foods. Keep all follow-up visits. This is important. Where to find support If you have been taking opioids for a long time, you may benefit from receiving support for quitting from a local support group or counselor. Ask your health care provider for a referral to these resources in your area. Where to find more information Centers for Disease Control and Prevention (CDC): http://www.wolf.info/ [place] Food and Drug Administration (FDA): [place] Get help right away if: You may have taken too much of an opioid (overdosed). Common symptoms of an overdose: Your breathing is slower or more shallow than normal. You have a very slow heartbeat (pulse). You have slurred speech. You  have nausea and vomiting. Your pupils become very small. You have other potential symptoms: You are very confused. You faint or feel like you will faint. You have cold, clammy skin. You have blue lips or fingernails. You have thoughts of harming yourself or harming others. These symptoms may represent a serious problem that is an emergency. Do not wait to see if the symptoms will go away. Get medical help right away. Call your local emergency services (911 in the [place] Do not drive yourself to the hospital.  If you ever feel like you may hurt yourself or others, or have thoughts about taking your own life, get help right away. Go to your nearest emergency department or: Call your local emergency services (911 in the [place] Call the [facility] (726)396-2735 in the [place] Call a suicide crisis helpline, such as the [facility] at 862-356-9966 or 988 in the [place]. This is open 24 hours a day in the [place] Text the Crisis Text Line at 570-192-0245 (in the [place].). Summary Opioid medicines can help you manage moderate to severe pain for a short period of time. A pain treatment plan is an agreement between you and your health care provider. Discuss the goals of your treatment, including how much pain you might expect to have and how you will manage the pain. If you think that you or someone else may have taken too much of an opioid, get medical help right away. This information is not intended to replace advice given to you by your health care provider. Make sure you discuss any questions you have with your health care provider. Document Revised: 08/23/2020 Document Reviewed: 05/10/2020 Elsevier Patient Education  [place] directives: Yes  Conditions/risks identified: None  Next appointment: Follow up in one year for your annual wellness visit.    Preventive Care 49 Years and Older, Male Preventive care refers to lifestyle choices and visits with your health care provider that can promote health and wellness. What does preventive care include? A yearly physical exam. This is also called an annual well check. Dental exams once or twice a year. Routine eye exams. Ask your health care provider how often you should have your eyes checked. Personal lifestyle choices, including: Daily care of your teeth and gums. Regular physical activity. Eating a healthy diet. Avoiding tobacco and drug use. Limiting alcohol use. Practicing safe sex. Taking low doses of aspirin every day. Taking vitamin and mineral supplements as recommended by your health care provider. What happens during an annual well check? The services and screenings done by your health care provider during your  annual well check will depend on your age, overall health, lifestyle risk factors, and family history of disease. Counseling  Your health care provider may ask you questions about your: Alcohol use. Tobacco use. Drug use. Emotional well-being. Home and relationship well-being. Sexual activity. Eating habits. History of falls. Memory and ability to understand (cognition). Work and work Statistician. Screening  You may have the following tests or measurements: Height, weight, and BMI. Blood pressure. Lipid and cholesterol levels. These may be checked every 5 years, or more frequently if you are over 74 years old. Skin check. Lung cancer screening. You may have this screening every year starting at age 67 if you have a 30-pack-year history of smoking and currently smoke or have quit within the past 15 years. Fecal occult blood test (FOBT) of the stool. You may have this test every year  starting at age 39. Flexible sigmoidoscopy or colonoscopy. You may have a sigmoidoscopy every 5 years or a colonoscopy every 10 years starting at age 74. Prostate cancer screening. Recommendations will vary depending on your family history and other risks. Hepatitis C blood test. Hepatitis B blood test. Sexually transmitted disease (STD) testing. Diabetes screening. This is done by checking your blood sugar (glucose) after you have not eaten for a while (fasting). You may have this done every 1-3 years. Abdominal aortic aneurysm (AAA) screening. You may need this if you are a current or former smoker. Osteoporosis. You may be screened starting at age 31 if you are at high risk. Talk with your health care provider about your test results, treatment options, and if necessary, the need for more tests. Vaccines  Your health care provider may recommend certain vaccines, such as: Influenza vaccine. This is recommended every year. Tetanus, diphtheria, and acellular pertussis (Tdap, Td) vaccine. You may need a Td  booster every 10 years. Zoster vaccine. You may need this after age 51. Pneumococcal 13-valent conjugate (PCV13) vaccine. One dose is recommended after age 88. Pneumococcal polysaccharide (PPSV23) vaccine. One dose is recommended after age 13. Talk to your health care provider about which screenings and vaccines you need and how often you need them. This information is not intended to replace advice given to you by your health care provider. Make sure you discuss any questions you have with your health care provider. Document Released: 02/24/2015 Document Revised: 10/18/2015 Document Reviewed: 11/29/2014 Elsevier Interactive Patient Education  2017 [place] Prevention in the Home Falls can cause injuries. They can happen to people of all ages. There are many things you can do to make your home safe and to help prevent falls. What can I do on the outside of my home? Regularly fix the edges of walkways and driveways and fix any cracks. Remove anything that might make you trip as you walk through a door, such as a raised step or threshold. Trim any bushes or trees on the path to your home. Use bright outdoor lighting. Clear any walking paths of anything that might make someone trip, such as rocks or tools. Regularly check to see if handrails are loose or broken. Make sure that both sides of any steps have handrails. Any raised decks and porches should have guardrails on the edges. Have any leaves, snow, or ice cleared regularly. Use sand or salt on walking paths during winter. Clean up any spills in your garage right away. This includes oil or grease spills. What can I do in the bathroom? Use night lights. Install grab bars by the toilet and in the tub and shower. Do not use towel bars as grab bars. Use non-skid mats or decals in the tub or shower. If you need to sit down in the shower, use a plastic, non-slip stool. Keep the floor dry. Clean up any water that spills on the floor  as soon as it happens. Remove soap buildup in the tub or shower regularly. Attach bath mats securely with double-sided non-slip rug tape. Do not have throw rugs and other things on the floor that can make you trip. What can I do in the bedroom? Use night lights. Make sure that you have a light by your bed that is easy to reach. Do not use any sheets or blankets that are too big for your bed. They should not hang down onto the floor. Have a firm chair that has side arms.  You can use this for support while you get dressed. Do not have throw rugs and other things on the floor that can make you trip. What can I do in the kitchen? Clean up any spills right away. Avoid walking on wet floors. Keep items that you use a lot in easy-to-reach places. If you need to reach something above you, use a strong step stool that has a grab bar. Keep electrical cords out of the way. Do not use floor polish or wax that makes floors slippery. If you must use wax, use non-skid floor wax. Do not have throw rugs and other things on the floor that can make you trip. What can I do with my stairs? Do not leave any items on the stairs. Make sure that there are handrails on both sides of the stairs and use them. Fix handrails that are broken or loose. Make sure that handrails are as long as the stairways. Check any carpeting to make sure that it is firmly attached to the stairs. Fix any carpet that is loose or worn. Avoid having throw rugs at the top or bottom of the stairs. If you do have throw rugs, attach them to the floor with carpet tape. Make sure that you have a light switch at the top of the stairs and the bottom of the stairs. If you do not have them, ask someone to add them for you. What else can I do to help prevent falls? Wear shoes that: Do not have high heels. Have rubber bottoms. Are comfortable and fit you well. Are closed at the toe. Do not wear sandals. If you use a stepladder: Make sure that it is  fully opened. Do not climb a closed stepladder. Make sure that both sides of the stepladder are locked into place. Ask someone to hold it for you, if possible. Clearly mark and make sure that you can see: Any grab bars or handrails. First and last steps. Where the edge of each step is. Use tools that help you move around (mobility aids) if they are needed. These include: Canes. Walkers. Scooters. Crutches. Turn on the lights when you go into a dark area. Replace any light bulbs as soon as they burn out. Set up your furniture so you have a clear path. Avoid moving your furniture around. If any of your floors are uneven, fix them. If there are any pets around you, be aware of where they are. Review your medicines with your doctor. Some medicines can make you feel dizzy. This can increase your chance of falling. Ask your doctor what other things that you can do to help prevent falls. This information is not intended to replace advice given to you by your health care provider. Make sure you discuss any questions you have with your health care provider. Document Released: 11/24/2008 Document Revised: 07/06/2015 Document Reviewed: 03/04/2014 Elsevier Interactive Patient Education  2017 [employer].

## 2021-07-18 NOTE — Progress Notes (Signed)
Subjective:   Timothy Garrett is a 88 y.o. male who presents for Medicare Annual/Subsequent preventive examination.  Review of Systems        Objective:    Today's Vitals   07/18/21 1314  BP: 120/60  Pulse: 69  Temp: 98.2 F (36.8 C)  TempSrc: Oral  SpO2: 98%  Weight: 136 lb (61.7 kg)  Height: '5\' 6"'$  (1.676 m)   Body mass index is 21.95 kg/m.     07/18/2021    1:26 PM 03/02/2021   10:26 AM 12/20/2019    2:10 PM 03/24/2018   10:00 AM 04/28/2017    3:54 PM  Advanced Directives  Does Patient Have a Medical Advance Directive? Yes Yes Yes Yes No  Type of Paramedic of [place];Living will Healthcare Power of [place];Living will [place];Living will   Does patient want to make changes to medical advance directive? No - Patient declined  No - Patient declined No - Patient declined   Copy of [place] in Chart? Yes - validated most recent copy scanned in chart (See row information)  No - copy requested No - copy requested   Would patient like information on creating a medical advance directive?     No - Patient declined    Current Medications (verified) Outpatient Encounter Medications as of 07/18/2021  Medication Sig   acetaminophen (TYLENOL) 500 MG tablet Take 1,000 mg by mouth every 6 (six) hours as needed (for pain).   acetaminophen-codeine (TYLENOL #3) 300-30 MG tablet Take 1-2 tablets by mouth every 6 (six) hours as needed for moderate pain.   atorvastatin (LIPITOR) 20 MG tablet TAKE 1/2 TABLET BY MOUTH AT BEDTIME   Cholecalciferol (VITAMIN D) 50 MCG (2000 UT) CAPS Take 1 capsule by mouth daily.   gabapentin (NEURONTIN) 100 MG capsule TAKE 2 CAPSULES BY MOUTH IN THE MORNING AND 3 CAPSULES AT BEDTIME   levothyroxine (SYNTHROID) 50 MCG tablet TAKE 1 TABLET(50 MCG) BY MOUTH DAILY   lisinopril (ZESTRIL) 20 MG tablet TAKE 1 TABLET(20 MG) BY MOUTH AT BEDTIME   LORazepam (ATIVAN) 1 MG  tablet TAKE 1/2 TO 1 TABLET(0.5 TO 1 MG) BY MOUTH AT BEDTIME   Multiple Vitamins-Minerals (CENTRUM SILVER 50+MEN) TABS Take 1 tablet by mouth daily with breakfast.   pantoprazole (PROTONIX) 40 MG tablet TAKE 1 TABLET BY MOUTH EVERY DAY   tamsulosin (FLOMAX) 0.4 MG CAPS capsule TAKE 1 CAPSULE BY MOUTH AT BEDTIME   traMADol (ULTRAM) 50 MG tablet TAKE 1 TABLET(50 MG) BY MOUTH EVERY 6 HOURS FOR UP TO 5 DAYS AS NEEDED   No facility-administered encounter medications on file as of 07/18/2021.    Allergies (verified) Sulfamethoxazole-trimethoprim   History: Past Medical History:  Diagnosis Date   Arthritis    Diverticulitis    Diverticulitis    GERD (gastroesophageal reflux disease)    Heartburn   Hyperlipemia    Hypertension    IBS (irritable bowel syndrome)    Kidney stones    Past Surgical History:  Procedure Laterality Date   BACK SURGERY     EYE SURGERY  2016   cataract surgery   TONSILLECTOMY  1941   ulcer surgery     Family History  Problem Relation Age of Onset   Stomach cancer Mother    Hypertension Mother    Heart disease Father    Heart attack Father    Heart attack Sister    Stroke Maternal Grandfather  Heart attack Paternal Grandfather    Social History   Socioeconomic History   Marital status: Married    Spouse name: Not on file   Number of children: 2   Years of education: Not on file   Highest education level: Not on file  Occupational History   Occupation: Medical illustrator    Comment: Retired  Tobacco Use   Smoking status: Former   Smokeless tobacco: Never   Tobacco comments:    Quit 60 years ago  Scientific laboratory technician Use: Never used  Substance and Sexual Activity   Alcohol use: No   Drug use: No   Sexual activity: Not Currently  Other Topics Concern   Not on file  Social History Narrative   Lives with wife on one level [place], recently downsized   Has two children, local, 4 grandchildren   Has one dog, Sam, enjoys walking it, but no other  regular exercise   Enjoys doing Haematologist, Event organiser   Social Determinants of Radio broadcast assistant Strain: Low Risk    Difficulty of Paying Living Expenses: Not hard at all  Food Insecurity: No Food Insecurity   Worried About Charity fundraiser in the Last Year: Never true   Arboriculturist in the Last Year: Never true  Transportation Needs: No Transportation Needs   Lack of Transportation (Medical): No   Lack of Transportation (Non-Medical): No  Physical Activity: Insufficiently Active   Days of Exercise per Week: 1 day   Minutes of Exercise per Session: 20 min  Stress: No Stress Concern Present   Feeling of Stress : Not at all  Social Connections: Socially Integrated   Frequency of Communication with Friends and Family: More than three times a week   Frequency of Social Gatherings with Friends and Family: More than three times a week   Attends Religious Services: More than 4 times per year   Active Member of [employer] or Organizations: Yes   Attends Music therapist: More than 4 times per year   Marital Status: Married    Tobacco Counseling Counseling given: Not Answered Tobacco comments: Quit 60 years ago   Clinical Intake:  Pre-visit preparation completed: NoDiabetic?  No  Activities of Daily Living    07/18/2021    1:23 PM  In your present state of health, do you have any difficulty performing the following activities:  Hearing? 0  Vision? 0  Difficulty concentrating or making decisions? 0  Walking or climbing stairs? 0  Dressing or bathing? 0  Doing errands, shopping? 0  Preparing Food and eating ? N  Using the Toilet? N  In the past six months, have you accidently leaked urine? N  Do you have problems with loss of bowel control? N  Managing your Medications? N  Managing your Finances? N  Housekeeping or managing your Housekeeping? N    Patient Care Team: Eulas Post, MD as PCP - General (Family Medicine) Loletha Carrow Kirke Corin, MD  as Consulting Physician (Gastroenterology) Calvert Cantor, MD as Consulting Physician (Ophthalmology) Viona Gilmore, [facility] as Pharmacist (Pharmacist)  Indicate any recent Medical Services you may have received from other than [facility] providers in the past year (date may be approximate).     Assessment:   This is a routine wellness examination for [employer].  Hearing/Vision screen Hearing Screening - Comments:: Wears hearing aids Vision Screening - Comments:: Wears glasses. Followed by Dr Gerilyn Nestle  Dietary issues and exercise activities discussed: Exercise  limited by: None identified   Goals Addressed               This Visit's Progress     Patient Stated (pt-stated)        Maintain independence.        Depression Screen    07/18/2021    1:21 PM 01/17/2021    9:16 AM 12/20/2019    2:13 PM 04/26/2019    2:11 PM 03/24/2018    9:30 AM 02/24/2018   11:05 AM  PHQ 2/9 Scores  PHQ - 2 Score 0 0 0 0 0 0  PHQ- 9 Score   0  0     Fall Risk    07/18/2021    1:24 PM 01/17/2021    9:16 AM 12/20/2019    2:12 PM 08/04/2019    8:42 AM 03/24/2018    9:30 AM  Fall Risk   Falls in the past year? 1 1 0 0 1  Number falls in past yr: 1 1 0 0   Injury with Fall? 0 0 0 0 1  Comment No injury or medical attention needed    sprained shoulder, but ok now  Risk for fall due to : Other (Comment)  Impaired balance/gait  History of fall(s)  Risk for fall due to: Comment Loss of balance    tripped by dog  Follow up   Falls evaluation completed;Falls prevention discussed Falls evaluation completed Falls prevention discussed;Education provided    FALL RISK PREVENTION PERTAINING TO THE HOME:  Any stairs in or around the home? No  If so, are there any without handrails? No  Home free of loose throw rugs in walkways, pet beds, electrical cords, etc? Yes  Adequate lighting in your home to reduce risk of falls? Yes   ASSISTIVE DEVICES UTILIZED TO PREVENT FALLS:  Life alert? No  Use of a cane, walker or  w/c? Yes  Grab bars in the bathroom? Yes Shower chair or bench in shower? Yes  Elevated toilet seat or a handicapped toilet? Yes   TIMED UP AND GO:  Was the test performed? Yes .  Length of time to ambulate 10 feet: 5 sec.   Gait slow and steady with assistive device  Cognitive Function:    03/24/2018    9:43 AM  MMSE - Mini Mental State Exam  Orientation to time 5  Orientation to Place 5  Registration 3  Attention/ Calculation 4  Recall 3  Language- name 2 objects 1  Language- repeat 1  Language- follow 3 step command 3  Language- read & follow direction 1  Write a sentence 1  Copy design 1  Total score 28        07/18/2021    1:26 PM  6CIT Screen  What Year? 0 points  What month? 0 points  What time? 0 points  Count back from 20 0 points  Months in reverse 0 points  Repeat phrase 0 points  Total Score 0 points    Immunizations Immunization History  Administered Date(s) Administered   Fluad Quad(high Dose 65+) 10/22/2018, 12/07/2019   Influenza Split 11/16/2010   Influenza Whole 11/13/2006, 11/17/2006, 11/13/2007, 11/23/2009   Influenza, High Dose Seasonal PF 10/27/2015, 11/18/2016, 10/11/2020   Influenza, Seasonal, Injecte, Preservative Fre 11/25/2014   Influenza,inj,Quad PF,6+ Mos 11/27/2017   Influenza,trivalent, recombinat, inj, PF 11/10/2013   PFIZER(Purple Top)SARS-COV-2 Vaccination 03/19/2019, 04/13/2019, 12/14/2019   Pneumococcal Conjugate-13 07/06/2014   Pneumococcal Polysaccharide-23 02/21/1999   Pneumococcal-Unspecified 02/21/1999   Td  02/11/1997, 08/19/2008   Tdap 08/11/2008      Flu Vaccine status: Up to date  Pneumococcal vaccine status: Up to date  Covid-19 vaccine status: Completed vaccines  Qualifies for Shingles Vaccine? Yes   Zostavax completed Yes   Shingrix Completed?: No.    Education has been provided regarding the importance of this vaccine. Patient has been advised to call insurance company to determine out of pocket  expense if they have not yet received this vaccine. Advised may also receive vaccine at local pharmacy or Health Dept. Verbalized acceptance and understanding.  Screening Tests Health Maintenance  Topic Date Due   Zoster Vaccines- Shingrix (1 of 2) 10/18/2021 (Originally 12/23/1950)   TETANUS/TDAP  07/19/2022 (Originally 08/20/2018)   INFLUENZA VACCINE  09/11/2021   Pneumonia Vaccine 74+ Years old  Completed   HPV VACCINES  Aged Out   COVID-19 Vaccine  Discontinued    Health Maintenance  There are no preventive care reminders to display for this patient.   Colorectal cancer screening: No longer required.   Lung Cancer Screening: (Low Dose CT Chest recommended if Age 39-80 years, 30 pack-year currently smoking OR have quit w/in 15years.) does not qualify.     Additional Screening:  Hepatitis C Screening: does not qualify; Completed   Vision Screening: Recommended annual ophthalmology exams for early detection of glaucoma and other disorders of the eye. Is the patient up to date with their annual eye exam?  Yes  Who is the provider or what is the name of the office in which the patient attends annual eye exams? Dr Gerilyn Nestle If pt is not established with a provider, would they like to be referred to a provider to establish care? No .   Dental Screening: Recommended annual dental exams for proper oral hygiene  Community Resource Referral / Chronic Care Management:  CRR required this visit?  No   CCM required this visit?  No      Plan:     I have personally reviewed and noted the following in the patient's chart:   Medical and social history Use of alcohol, tobacco or illicit drugs  Current medications and supplements including opioid prescriptions. Patient is currently taking opioid prescriptions. Information provided to patient regarding non-opioid alternatives. Patient advised to discuss non-opioid treatment plan with their provider. Functional ability and  status Nutritional status Physical activity Advanced directives List of other physicians Hospitalizations, surgeries, and ER visits in previous 12 months Vitals Screenings to include cognitive, depression, and falls Referrals and appointments  In addition, I have reviewed and discussed with patient certain preventive protocols, quality metrics, and best practice recommendations. A written personalized care plan for preventive services as well as general preventive health recommendations were provided to patient.     Criselda Peaches, LPN   08/16/1605   Nurse Notes: None

## 2021-07-20 DIAGNOSIS — M5416 Radiculopathy, lumbar region: Secondary | ICD-10-CM | POA: Diagnosis not present

## 2021-08-31 ENCOUNTER — Other Ambulatory Visit: Payer: Self-pay | Admitting: Family Medicine

## 2021-08-31 DIAGNOSIS — F419 Anxiety disorder, unspecified: Secondary | ICD-10-CM

## 2021-08-31 NOTE — Telephone Encounter (Signed)
Last OV-03/30/21 Last refill-10/25/20--30 tabs, 5 refills  No future OV scheduled.

## 2021-09-14 DIAGNOSIS — M48061 Spinal stenosis, lumbar region without neurogenic claudication: Secondary | ICD-10-CM | POA: Diagnosis not present

## 2021-09-14 DIAGNOSIS — M5416 Radiculopathy, lumbar region: Secondary | ICD-10-CM | POA: Diagnosis not present

## 2021-09-25 ENCOUNTER — Other Ambulatory Visit: Payer: Self-pay | Admitting: Family Medicine

## 2021-10-18 ENCOUNTER — Other Ambulatory Visit: Payer: Self-pay | Admitting: Family Medicine

## 2021-10-18 DIAGNOSIS — E039 Hypothyroidism, unspecified: Secondary | ICD-10-CM

## 2021-10-18 DIAGNOSIS — I10 Essential (primary) hypertension: Secondary | ICD-10-CM

## 2021-10-23 ENCOUNTER — Ambulatory Visit (INDEPENDENT_AMBULATORY_CARE_PROVIDER_SITE_OTHER): Payer: Medicare Other

## 2021-10-23 DIAGNOSIS — Z23 Encounter for immunization: Secondary | ICD-10-CM

## 2021-11-14 DIAGNOSIS — H35371 Puckering of macula, right eye: Secondary | ICD-10-CM | POA: Diagnosis not present

## 2021-11-14 DIAGNOSIS — H353121 Nonexudative age-related macular degeneration, left eye, early dry stage: Secondary | ICD-10-CM | POA: Diagnosis not present

## 2021-11-14 DIAGNOSIS — H26493 Other secondary cataract, bilateral: Secondary | ICD-10-CM | POA: Diagnosis not present

## 2021-11-14 DIAGNOSIS — H43813 Vitreous degeneration, bilateral: Secondary | ICD-10-CM | POA: Diagnosis not present

## 2021-12-31 ENCOUNTER — Encounter: Payer: Self-pay | Admitting: Family Medicine

## 2021-12-31 ENCOUNTER — Ambulatory Visit: Payer: Medicare Other | Admitting: Family Medicine

## 2021-12-31 VITALS — BP 102/60 | HR 100 | Temp 98.5°F | Ht 66.0 in | Wt 135.6 lb

## 2021-12-31 DIAGNOSIS — R6883 Chills (without fever): Secondary | ICD-10-CM

## 2021-12-31 DIAGNOSIS — R051 Acute cough: Secondary | ICD-10-CM | POA: Diagnosis not present

## 2021-12-31 DIAGNOSIS — J029 Acute pharyngitis, unspecified: Secondary | ICD-10-CM | POA: Diagnosis not present

## 2021-12-31 DIAGNOSIS — J202 Acute bronchitis due to streptococcus: Secondary | ICD-10-CM | POA: Diagnosis not present

## 2021-12-31 LAB — POC COVID19 BINAXNOW: SARS Coronavirus 2 Ag: NEGATIVE

## 2021-12-31 LAB — POCT INFLUENZA A/B
Influenza A, POC: NEGATIVE
Influenza B, POC: NEGATIVE

## 2021-12-31 LAB — POCT RAPID STREP A (OFFICE): Rapid Strep A Screen: POSITIVE — AB

## 2021-12-31 MED ORDER — CEFDINIR 300 MG PO CAPS: 300.0000 mg | ORAL_CAPSULE | Freq: Two times a day (BID) | ORAL | 0 refills | Status: DC

## 2021-12-31 MED ORDER — METHYLPREDNISOLONE 4 MG PO TBPK: ORAL_TABLET | ORAL | 0 refills | Status: DC

## 2021-12-31 MED ORDER — ALBUTEROL SULFATE HFA 108 (90 BASE) MCG/ACT IN AERS: 2.0000 | INHALATION_SPRAY | Freq: Four times a day (QID) | RESPIRATORY_TRACT | 0 refills | Status: DC | PRN

## 2021-12-31 NOTE — Progress Notes (Unsigned)
Established Patient Office Visit  Subjective   Patient ID: Timothy Garrett, male    DOB: 1931/07/05  Age: 88 y.o. MRN: [id number]  Chief Complaint  Patient presents with   Ear Fullness    Patient complains of a "squishy sound in the left ear" x1 month   Cough    Productive with clear sputum x4 days, tried Robitussin with some relief   Sore Throat    X4 days   Chills    Noted today    Patient is here for acute symptoms of coughing with some clear sputum production. Throat is very sore.  Some trouble swallowing  Ear Fullness  There is pain in the left ear. The current episode started more than 1 month ago. There has been no fever. The fever has been present for Less than 1 day. Associated symptoms include coughing.  Cough  Sore Throat  Associated symptoms include coughing and a plugged ear sensation.    {History (Optional):23778}  Review of Systems  Respiratory:  Positive for cough.   All other systems reviewed and are negative.     Objective:     BP 102/60 (BP Location: Left Arm, Patient Position: Sitting, Cuff Size: Normal)   Pulse 100   Temp 98.5 F (36.9 C) (Oral)   Ht '5\' 6"'$  (1.676 m)   Wt 135 lb 9.6 oz (61.5 kg)   SpO2 98%   BMI 21.89 kg/m  {Vitals History (Optional):23777}  Physical Exam Vitals reviewed.  Constitutional:      Appearance: Normal appearance. He is well-groomed and normal weight.  Eyes:     Extraocular Movements: Extraocular movements intact.     Conjunctiva/sclera: Conjunctivae normal.  Neck:     Thyroid: No thyromegaly.  Cardiovascular:     Rate and Rhythm: Normal rate and regular rhythm.     Heart sounds: S1 normal and S2 normal. No murmur heard. Pulmonary:     Effort: Pulmonary effort is normal.     Breath sounds: Normal breath sounds and air entry. No rales.  Abdominal:     General: Abdomen is flat. Bowel sounds are normal.  Musculoskeletal:     Right lower leg: No edema.     Left lower leg: No edema.  Neurological:      General: No focal deficit present.     Mental Status: He is alert and oriented to person, place, and time.     Gait: Gait is intact.  Psychiatric:        Mood and Affect: Mood and affect normal.      Results for orders placed or performed in visit on 12/31/21  POC COVID-19 BinaxNow  Result Value Ref Range   SARS Coronavirus 2 Ag Negative Negative  POCT Influenza A/B  Result Value Ref Range   Influenza A, POC Negative Negative   Influenza B, POC Negative Negative  POCT rapid strep A  Result Value Ref Range   Rapid Strep A Screen Positive (A) Negative    {Labs (Optional):23779}  The ASCVD Risk score (Arnett DK, et al., 2019) failed to calculate for the following reasons:   The 2019 ASCVD risk score is only valid for ages 37 to 48    Assessment & Plan:   Problem List Items Addressed This Visit   None Visit Diagnoses     Acute cough    -  Primary   Relevant Orders   POC COVID-19 BinaxNow (Completed)   POCT Influenza A/B (Completed)   Chills  Relevant Orders   POCT Influenza A/B (Completed)   Sore throat       Relevant Orders   POCT rapid strep A (Completed)   Acute bronchitis due to Streptococcus       Relevant Medications   methylPREDNISolone (MEDROL DOSEPAK) 4 MG TBPK tablet   cefdinir (OMNICEF) 300 MG capsule   albuterol (VENTOLIN HFA) 108 (90 Base) MCG/ACT inhaler       No follow-ups on file.    Farrel Conners, MD

## 2021-12-31 NOTE — Patient Instructions (Signed)
Debrox ear drops-- 2 drops in the right ear daily at bedtime to help soften the wax.

## 2022-01-08 ENCOUNTER — Other Ambulatory Visit: Payer: Self-pay | Admitting: Family Medicine

## 2022-01-14 ENCOUNTER — Other Ambulatory Visit: Payer: Self-pay | Admitting: Family Medicine

## 2022-01-14 DIAGNOSIS — I10 Essential (primary) hypertension: Secondary | ICD-10-CM

## 2022-01-14 DIAGNOSIS — E039 Hypothyroidism, unspecified: Secondary | ICD-10-CM

## 2022-01-18 DIAGNOSIS — M48061 Spinal stenosis, lumbar region without neurogenic claudication: Secondary | ICD-10-CM | POA: Diagnosis not present

## 2022-01-22 ENCOUNTER — Other Ambulatory Visit: Payer: Self-pay | Admitting: Family Medicine

## 2022-01-22 DIAGNOSIS — J202 Acute bronchitis due to streptococcus: Secondary | ICD-10-CM

## 2022-01-29 DIAGNOSIS — M5416 Radiculopathy, lumbar region: Secondary | ICD-10-CM | POA: Diagnosis not present

## 2022-04-03 ENCOUNTER — Other Ambulatory Visit: Payer: Self-pay | Admitting: Family Medicine

## 2022-04-03 DIAGNOSIS — F419 Anxiety disorder, unspecified: Secondary | ICD-10-CM

## 2022-04-12 ENCOUNTER — Other Ambulatory Visit: Payer: Self-pay

## 2022-04-12 DIAGNOSIS — F419 Anxiety disorder, unspecified: Secondary | ICD-10-CM

## 2022-04-14 MED ORDER — LORAZEPAM 1 MG PO TABS: ORAL_TABLET | ORAL | 5 refills | Status: DC

## 2022-04-16 ENCOUNTER — Other Ambulatory Visit: Payer: Self-pay | Admitting: Family Medicine

## 2022-04-16 DIAGNOSIS — E039 Hypothyroidism, unspecified: Secondary | ICD-10-CM

## 2022-04-16 DIAGNOSIS — I10 Essential (primary) hypertension: Secondary | ICD-10-CM

## 2022-05-08 ENCOUNTER — Other Ambulatory Visit: Payer: Self-pay | Admitting: Family Medicine

## 2022-05-20 DIAGNOSIS — M5416 Radiculopathy, lumbar region: Secondary | ICD-10-CM | POA: Diagnosis not present

## 2022-05-20 DIAGNOSIS — M48061 Spinal stenosis, lumbar region without neurogenic claudication: Secondary | ICD-10-CM | POA: Diagnosis not present

## 2022-05-31 DIAGNOSIS — M5416 Radiculopathy, lumbar region: Secondary | ICD-10-CM | POA: Diagnosis not present

## 2022-06-05 ENCOUNTER — Other Ambulatory Visit: Payer: Self-pay | Admitting: Family Medicine

## 2022-06-14 ENCOUNTER — Encounter: Payer: Self-pay | Admitting: Adult Health

## 2022-06-14 ENCOUNTER — Ambulatory Visit (INDEPENDENT_AMBULATORY_CARE_PROVIDER_SITE_OTHER): Payer: Medicare Other | Admitting: Adult Health

## 2022-06-14 VITALS — BP 110/60 | HR 63 | Temp 98.0°F | Ht 66.0 in | Wt 134.0 lb

## 2022-06-14 DIAGNOSIS — H6123 Impacted cerumen, bilateral: Secondary | ICD-10-CM | POA: Diagnosis not present

## 2022-06-14 NOTE — Progress Notes (Signed)
Subjective:    Patient ID: Timothy Garrett, male    DOB: 1931-08-31, 89 y.o.   MRN: [id number]  HPI  89 year old male who  has a past medical history of Arthritis, Diverticulitis, Diverticulitis, GERD (gastroesophageal reflux disease), Hyperlipemia, Hypertension, IBS (irritable bowel syndrome), and Kidney stones.  He is a patient of Dr. Caryl Never who I am seeing today for an acute issue of right sided ear pain. He feels as though his ear is full of wax. Has been experiencing muffled sounds. He does wear hearing aids.   Review of Systems See HPI   Past Medical History:  Diagnosis Date   Arthritis    Diverticulitis    Diverticulitis    GERD (gastroesophageal reflux disease)    Heartburn   Hyperlipemia    Hypertension    IBS (irritable bowel syndrome)    Kidney stones     Social History   Socioeconomic History   Marital status: Married    Spouse name: Not on file   Number of children: 2   Years of education: Not on file   Highest education level: Not on file  Occupational History   Occupation: Advertising account planner    Comment: Retired  Tobacco Use   Smoking status: Former   Smokeless tobacco: Never   Tobacco comments:    Quit 60 years ago  Building services engineer Use: Never used  Substance and Sexual Activity   Alcohol use: No   Drug use: No   Sexual activity: Not Currently  Other Topics Concern   Not on file  Social History Narrative   Lives with wife on one level condo, recently downsized   Has two children, local, 4 grandchildren   Has one dog, Sam, enjoys walking it, but no other regular exercise   Enjoys doing Presenter, broadcasting, Curator   Social Determinants of Health   Financial Resource Strain: Low Risk  (07/18/2021)   Overall Financial Resource Strain (CARDIA)    Difficulty of Paying Living Expenses: Not hard at all  Food Insecurity: No Food Insecurity (07/18/2021)   Hunger Vital Sign    Worried About Running Out of Food in the Last Year: Never true    Ran  Out of Food in the Last Year: Never true  Transportation Needs: No Transportation Needs (07/18/2021)   PRAPARE - Administrator, Civil Service (Medical): No    Lack of Transportation (Non-Medical): No  Physical Activity: Insufficiently Active (07/18/2021)   Exercise Vital Sign    Days of Exercise per Week: 1 day    Minutes of Exercise per Session: 20 min  Stress: No Stress Concern Present (07/18/2021)   [employer] of Occupational Health - Occupational Stress Questionnaire    Feeling of Stress : Not at all  Social Connections: Socially Integrated (07/18/2021)   Social Connection and Isolation Panel [NHANES]    Frequency of Communication with Friends and Family: More than three times a week    Frequency of Social Gatherings with Friends and Family: More than three times a week    Attends Religious Services: More than 4 times per year    Active Member of [employer] or Organizations: Yes    Attends Banker Meetings: More than 4 times per year    Marital Status: Married  Catering manager Violence: Not At Risk (07/18/2021)   Humiliation, Afraid, Rape, and Kick questionnaire    Fear of Current or Ex-Partner: No    Emotionally Abused: No  Physically Abused: No    Sexually Abused: No    Past Surgical History:  Procedure Laterality Date   BACK SURGERY     EYE SURGERY  2016   cataract surgery   TONSILLECTOMY  1941   ulcer surgery      Family History  Problem Relation Age of Onset   Stomach cancer Mother    Hypertension Mother    Heart disease Father    Heart attack Father    Heart attack Sister    Stroke Maternal Grandfather    Heart attack Paternal Grandfather     Allergies  Allergen Reactions   Sulfamethoxazole-Trimethoprim Itching    Current Outpatient Medications on File Prior to Visit  Medication Sig Dispense Refill   acetaminophen (TYLENOL) 500 MG tablet Take 1,000 mg by mouth every 6 (six) hours as needed (for pain).     acetaminophen-codeine  (TYLENOL #3) 300-30 MG tablet Take 1-2 tablets by mouth every 6 (six) hours as needed for moderate pain. 20 tablet 0   albuterol (VENTOLIN HFA) 108 (90 Base) MCG/ACT inhaler Inhale 2 puffs into the lungs every 6 (six) hours as needed for wheezing or shortness of breath. 8 g 0   atorvastatin (LIPITOR) 20 MG tablet TAKE 1/2 TABLET BY MOUTH AT BEDTIME 45 tablet 0   cefdinir (OMNICEF) 300 MG capsule Take 1 capsule (300 mg total) by mouth 2 (two) times daily. 14 capsule 0   Cholecalciferol (VITAMIN D) 50 MCG (2000 UT) CAPS Take 1 capsule by mouth daily.     gabapentin (NEURONTIN) 100 MG capsule TAKE 2 CAPSULES BY MOUTH EVERY MORNING AND 3 CAPSULES AT BEDTIME 150 capsule 0   levothyroxine (SYNTHROID) 50 MCG tablet TAKE 1 TABLET(50 MCG) BY MOUTH DAILY 90 tablet 0   lisinopril (ZESTRIL) 20 MG tablet TAKE 1 TABLET(20 MG) BY MOUTH AT BEDTIME 90 tablet 0   LORazepam (ATIVAN) 1 MG tablet TAKE 1/2 TO 1 TABLET(0.5 TO 1 MG) BY MOUTH AT BEDTIME 30 tablet 5   methylPREDNISolone (MEDROL DOSEPAK) 4 MG TBPK tablet Take package as directed 21 each 0   Multiple Vitamins-Minerals (CENTRUM SILVER 50+MEN) TABS Take 1 tablet by mouth daily with breakfast.     pantoprazole (PROTONIX) 40 MG tablet TAKE 1 TABLET BY MOUTH EVERY DAY. NEED APPOINTMENT FOR REFILLS. 90 tablet 0   tamsulosin (FLOMAX) 0.4 MG CAPS capsule TAKE 1 CAPSULE BY MOUTH AT BEDTIME 90 capsule 0   traMADol (ULTRAM) 50 MG tablet TAKE 1 TABLET(50 MG) BY MOUTH EVERY 6 HOURS FOR UP TO 5 DAYS AS NEEDED 30 tablet 1   No current facility-administered medications on file prior to visit.    BP 110/60   Pulse 63   Temp 98 F (36.7 C) (Oral)   Ht 5\' 6"  (1.676 m)   Wt 134 lb (60.8 kg)   SpO2 98%   BMI 21.63 kg/m       Objective:   Physical Exam Vitals and nursing note reviewed.  Constitutional:      Appearance: Normal appearance.  HENT:     Right Ear: There is impacted cerumen.     Left Ear: There is impacted cerumen.  Cardiovascular:     Rate and  Rhythm: Normal rate and regular rhythm.     Pulses: Normal pulses.     Heart sounds: Normal heart sounds.  Pulmonary:     Effort: Pulmonary effort is normal.     Breath sounds: Normal breath sounds.  Skin:    General: Skin is warm and  dry.  Neurological:     General: No focal deficit present.     Mental Status: He is alert and oriented to person, place, and time.  Psychiatric:        Mood and Affect: Mood normal.        Behavior: Behavior normal.        Thought Content: Thought content normal.        Judgment: Judgment normal.        Assessment & Plan:  1. Bilateral impacted cerumen Ear Cerumen Removal  Date/Time: 06/14/2022 10:18 AM  Performed by: Shirline Frees, NP Authorized by: Shirline Frees, NP   Anesthesia: Local Anesthetic: none Ceruminolytics applied: Ceruminolytics applied prior to the procedure. Location details: right ear and left ear Patient tolerance: patient tolerated the procedure well with no immediate complications Comments: After cerumen was removed patient reported relief in symptoms. No trauma to ear canal or TM noted. No signs of infection.  Procedure type: irrigation  Sedation: Patient sedated: no     Shirline Frees, NP

## 2022-07-05 ENCOUNTER — Other Ambulatory Visit: Payer: Self-pay | Admitting: Family Medicine

## 2022-07-15 ENCOUNTER — Other Ambulatory Visit: Payer: Self-pay | Admitting: Family Medicine

## 2022-07-15 DIAGNOSIS — E039 Hypothyroidism, unspecified: Secondary | ICD-10-CM

## 2022-07-15 DIAGNOSIS — I10 Essential (primary) hypertension: Secondary | ICD-10-CM

## 2022-07-24 ENCOUNTER — Ambulatory Visit (INDEPENDENT_AMBULATORY_CARE_PROVIDER_SITE_OTHER): Payer: Medicare Other

## 2022-07-24 VITALS — BP 118/60 | HR 82 | Temp 97.8°F | Ht 66.0 in | Wt 135.1 lb

## 2022-07-24 DIAGNOSIS — Z0000 Encounter for general adult medical examination without abnormal findings: Secondary | ICD-10-CM

## 2022-07-24 NOTE — Patient Instructions (Addendum)
[name] , Thank you for taking time to come for your Medicare Wellness Visit. I appreciate your ongoing commitment to your health goals. Please review the following plan we discussed and let me know if I can assist you in the future.   These are the goals we discussed:  Goals       Patient Stated (pt-stated)      Maintain independence.        This is a list of the screening recommended for you and due dates:  Health Maintenance  Topic Date Due   DTaP/Tdap/Td vaccine (4 - Td or Tdap) 08/20/2018   Zoster (Shingles) Vaccine (1 of 2) 10/24/2022*   Flu Shot  09/12/2022   Medicare Annual Wellness Visit  07/24/2023   Pneumonia Vaccine  Completed   HPV Vaccine  Aged Out   COVID-19 Vaccine  Discontinued  *Topic was postponed. The date shown is not the original due date.  Opioid Pain Medicine Management Opioids are powerful medicines that are used to treat moderate to severe pain. When used for short periods of time, they can help you to: Sleep better. Do better in physical or occupational therapy. Feel better in the first few days after an injury. Recover from surgery. Opioids should be taken with the supervision of a trained health care provider. They should be taken for the shortest period of time possible. This is because opioids can be addictive, and the longer you take opioids, the greater your risk of addiction. This addiction can also be called opioid use disorder. What are the risks? Using opioid pain medicines for longer than 3 days increases your risk of side effects. Side effects include: Constipation. Nausea and vomiting. Breathing difficulties (respiratory depression). Drowsiness. Confusion. Opioid use disorder. Itching. Taking opioid pain medicine for a long period of time can affect your ability to do daily tasks. It also puts you at risk for: Motor vehicle crashes. Depression. Suicide. Heart attack. Overdose, which can be life-threatening. What is a pain  treatment plan? A pain treatment plan is an agreement between you and your health care provider. Pain is unique to each person, and treatments vary depending on your condition. To manage your pain, you and your health care provider need to work together. To help you do this: Discuss the goals of your treatment, including how much pain you might expect to have and how you will manage the pain. Review the risks and benefits of taking opioid medicines. Remember that a good treatment plan uses more than one approach and minimizes the chance of side effects. Be honest about the amount of medicines you take and about any drug or alcohol use. Get pain medicine prescriptions from only one health care provider. Pain can be managed with many types of alternative treatments. Ask your health care provider to refer you to one or more specialists who can help you manage pain through: Physical or occupational therapy. Counseling (cognitive behavioral therapy). Good nutrition. Biofeedback. Massage. Meditation. Non-opioid medicine. Following a gentle exercise program. How to use opioid pain medicine Taking medicine Take your pain medicine exactly as told by your health care provider. Take it only when you need it. If your pain gets less severe, you may take less than your prescribed dose if your health care provider approves. If you are not having pain, do nottake pain medicine unless your health care provider tells you to take it. If your pain is severe, do nottry to treat it yourself by taking more pills than instructed  on your prescription. Contact your health care provider for help. Write down the times when you take your pain medicine. It is easy to become confused while on pain medicine. Writing the time can help you avoid overdose. Take other over-the-counter or prescription medicines only as told by your health care provider. Keeping yourself and others safe  While you are taking opioid pain  medicine: Do not drive, use machinery, or power tools. Do not sign legal documents. Do not drink alcohol. Do not take sleeping pills. Do not supervise children by yourself. Do not do activities that require climbing or being in high places. Do not go to a lake, river, ocean, spa, or swimming pool. Do not share your pain medicine with anyone. Keep pain medicine in a locked cabinet or in a secure area where pets and children cannot reach it. Stopping your use of opioids If you have been taking opioid medicine for more than a few weeks, you may need to slowly decrease (taper) how much you take until you stop completely. Tapering your use of opioids can decrease your risk of symptoms of withdrawal, such as: Pain and cramping in the abdomen. Nausea. Sweating. Sleepiness. Restlessness. Uncontrollable shaking (tremors). Cravings for the medicine. Do not attempt to taper your use of opioids on your own. Talk with your health care provider about how to do this. Your health care provider may prescribe a step-down schedule based on how much medicine you are taking and how long you have been taking it. Getting rid of leftover pills Do not save any leftover pills. Get rid of leftover pills safely by: Taking the medicine to a prescription take-back program. This is usually offered by the county or law enforcement. Bringing them to a pharmacy that has a drug disposal container. Flushing them down the toilet. Check the label or package insert of your medicine to see whether this is safe to do. Throwing them out in the trash. Check the label or package insert of your medicine to see whether this is safe to do. If it is safe to throw it out, remove the medicine from the original container, put it into a sealable bag or container, and mix it with used coffee grounds, food scraps, dirt, or cat litter before putting it in the trash. Follow these instructions at home: Activity Do exercises as told by your  health care provider. Avoid activities that make your pain worse. Return to your normal activities as told by your health care provider. Ask your health care provider what activities are safe for you. General instructions You may need to take these actions to prevent or treat constipation: Drink enough fluid to keep your urine pale yellow. Take over-the-counter or prescription medicines. Eat foods that are high in fiber, such as beans, whole grains, and fresh fruits and vegetables. Limit foods that are high in fat and processed sugars, such as fried or sweet foods. Keep all follow-up visits. This is important. Where to find support If you have been taking opioids for a long time, you may benefit from receiving support for quitting from a local support group or counselor. Ask your health care provider for a referral to these resources in your area. Where to find more information Centers for Disease Control and Prevention (CDC): FootballExhibition.com.br [place] Food and Drug Administration (FDA): PumpkinSearch.com.ee Get help right away if: You may have taken too much of an opioid (overdosed). Common symptoms of an overdose: Your breathing is slower or more shallow than normal. You have  a very slow heartbeat (pulse). You have slurred speech. You have nausea and vomiting. Your pupils become very small. You have other potential symptoms: You are very confused. You faint or feel like you will faint. You have cold, clammy skin. You have blue lips or fingernails. You have thoughts of harming yourself or harming others. These symptoms may represent a serious problem that is an emergency. Do not wait to see if the symptoms will go away. Get medical help right away. Call your local emergency services (911 in the [place] Do not drive yourself to the hospital.  If you ever feel like you may hurt yourself or others, or have thoughts about taking your own life, get help right away. Go to your nearest emergency department  or: Call your local emergency services (911 in the [place] Call the [facility] (631-807-7828 in the [place] Call a suicide crisis helpline, such as the National Suicide Prevention Lifeline at (917) 085-9236 or 988 in the [place] This is open 24 hours a day in the [place] Text the Crisis Text Line at 708-355-6634 (in the [place] Summary Opioid medicines can help you manage moderate to severe pain for a short period of time. A pain treatment plan is an agreement between you and your health care provider. Discuss the goals of your treatment, including how much pain you might expect to have and how you will manage the pain. If you think that you or someone else may have taken too much of an opioid, get medical help right away. This information is not intended to replace advice given to you by your health care provider. Make sure you discuss any questions you have with your health care provider. Document Revised: 08/23/2020 Document Reviewed: 05/10/2020 Elsevier Patient Education  2024 [employer]   Advanced directives: In cart  Conditions/risks identified: None  Next appointment: Follow up in one year for your annual wellness visit.   Preventive Care 29 Years and Older, Male  Preventive care refers to lifestyle choices and visits with your health care provider that can promote health and wellness. What does preventive care include? A yearly physical exam. This is also called an annual well check. Dental exams once or twice a year. Routine eye exams. Ask your health care provider how often you should have your eyes checked. Personal lifestyle choices, including: Daily care of your teeth and gums. Regular physical activity. Eating a healthy diet. Avoiding tobacco and drug use. Limiting alcohol use. Practicing safe sex. Taking low doses of aspirin every day. Taking vitamin and mineral supplements as recommended by your health care provider. What happens during an annual well  check? The services and screenings done by your health care provider during your annual well check will depend on your age, overall health, lifestyle risk factors, and family history of disease. Counseling  Your health care provider may ask you questions about your: Alcohol use. Tobacco use. Drug use. Emotional well-being. Home and relationship well-being. Sexual activity. Eating habits. History of falls. Memory and ability to understand (cognition). Work and work Astronomer. Screening  You may have the following tests or measurements: Height, weight, and BMI. Blood pressure. Lipid and cholesterol levels. These may be checked every 5 years, or more frequently if you are over 59 years old. Skin check. Lung cancer screening. You may have this screening every year starting at age 30 if you have a 30-pack-year history of smoking and currently smoke or have quit within the past 15 years. Fecal occult blood test (  FOBT) of the stool. You may have this test every year starting at age 40. Flexible sigmoidoscopy or colonoscopy. You may have a sigmoidoscopy every 5 years or a colonoscopy every 10 years starting at age 20. Prostate cancer screening. Recommendations will vary depending on your family history and other risks. Hepatitis C blood test. Hepatitis B blood test. Sexually transmitted disease (STD) testing. Diabetes screening. This is done by checking your blood sugar (glucose) after you have not eaten for a while (fasting). You may have this done every 1-3 years. Abdominal aortic aneurysm (AAA) screening. You may need this if you are a current or former smoker. Osteoporosis. You may be screened starting at age 88 if you are at high risk. Talk with your health care provider about your test results, treatment options, and if necessary, the need for more tests. Vaccines  Your health care provider may recommend certain vaccines, such as: Influenza vaccine. This is recommended every  year. Tetanus, diphtheria, and acellular pertussis (Tdap, Td) vaccine. You may need a Td booster every 10 years. Zoster vaccine. You may need this after age 69. Pneumococcal 13-valent conjugate (PCV13) vaccine. One dose is recommended after age 29. Pneumococcal polysaccharide (PPSV23) vaccine. One dose is recommended after age 14. Talk to your health care provider about which screenings and vaccines you need and how often you need them. This information is not intended to replace advice given to you by your health care provider. Make sure you discuss any questions you have with your health care provider. Document Released: 02/24/2015 Document Revised: 10/18/2015 Document Reviewed: 11/29/2014 Elsevier Interactive Patient Education  2017 [employer].  Fall Prevention in the Home Falls can cause injuries. They can happen to people of all ages. There are many things you can do to make your home safe and to help prevent falls. What can I do on the outside of my home? Regularly fix the edges of walkways and driveways and fix any cracks. Remove anything that might make you trip as you walk through a door, such as a raised step or threshold. Trim any bushes or trees on the path to your home. Use bright outdoor lighting. Clear any walking paths of anything that might make someone trip, such as rocks or tools. Regularly check to see if handrails are loose or broken. Make sure that both sides of any steps have handrails. Any raised decks and porches should have guardrails on the edges. Have any leaves, snow, or ice cleared regularly. Use sand or salt on walking paths during winter. Clean up any spills in your garage right away. This includes oil or grease spills. What can I do in the bathroom? Use night lights. Install grab bars by the toilet and in the tub and shower. Do not use towel bars as grab bars. Use non-skid mats or decals in the tub or shower. If you need to sit down in the shower, use a  plastic, non-slip stool. Keep the floor dry. Clean up any water that spills on the floor as soon as it happens. Remove soap buildup in the tub or shower regularly. Attach bath mats securely with double-sided non-slip rug tape. Do not have throw rugs and other things on the floor that can make you trip. What can I do in the bedroom? Use night lights. Make sure that you have a light by your bed that is easy to reach. Do not use any sheets or blankets that are too big for your bed. They should not hang down  onto the floor. Have a firm chair that has side arms. You can use this for support while you get dressed. Do not have throw rugs and other things on the floor that can make you trip. What can I do in the kitchen? Clean up any spills right away. Avoid walking on wet floors. Keep items that you use a lot in easy-to-reach places. If you need to reach something above you, use a strong step stool that has a grab bar. Keep electrical cords out of the way. Do not use floor polish or wax that makes floors slippery. If you must use wax, use non-skid floor wax. Do not have throw rugs and other things on the floor that can make you trip. What can I do with my stairs? Do not leave any items on the stairs. Make sure that there are handrails on both sides of the stairs and use them. Fix handrails that are broken or loose. Make sure that handrails are as long as the stairways. Check any carpeting to make sure that it is firmly attached to the stairs. Fix any carpet that is loose or worn. Avoid having throw rugs at the top or bottom of the stairs. If you do have throw rugs, attach them to the floor with carpet tape. Make sure that you have a light switch at the top of the stairs and the bottom of the stairs. If you do not have them, ask someone to add them for you. What else can I do to help prevent falls? Wear shoes that: Do not have high heels. Have rubber bottoms. Are comfortable and fit you  well. Are closed at the toe. Do not wear sandals. If you use a stepladder: Make sure that it is fully opened. Do not climb a closed stepladder. Make sure that both sides of the stepladder are locked into place. Ask someone to hold it for you, if possible. Clearly mark and make sure that you can see: Any grab bars or handrails. First and last steps. Where the edge of each step is. Use tools that help you move around (mobility aids) if they are needed. These include: Canes. Walkers. Scooters. Crutches. Turn on the lights when you go into a dark area. Replace any light bulbs as soon as they burn out. Set up your furniture so you have a clear path. Avoid moving your furniture around. If any of your floors are uneven, fix them. If there are any pets around you, be aware of where they are. Review your medicines with your doctor. Some medicines can make you feel dizzy. This can increase your chance of falling. Ask your doctor what other things that you can do to help prevent falls. This information is not intended to replace advice given to you by your health care provider. Make sure you discuss any questions you have with your health care provider. Document Released: 11/24/2008 Document Revised: 07/06/2015 Document Reviewed: 03/04/2014 Elsevier Interactive Patient Education  2017 [employer].

## 2022-07-24 NOTE — Progress Notes (Signed)
Subjective:   Timothy Garrett is a 89 y.o. male who presents for Medicare Annual/Subsequent preventive examination.  Review of Systems     Cardiac Risk Factors include: advanced age (>51men, >2 women);male gender;hypertension     Objective:    Today's Vitals   07/24/22 1534  BP: 118/60  Pulse: 82  Temp: 97.8 F (36.6 C)  TempSrc: Oral  SpO2: 96%  Weight: 135 lb 1.6 oz (61.3 kg)  Height: 5\' 6"  (1.676 m)   Body mass index is 21.81 kg/m.     07/24/2022    4:03 PM 07/18/2021    1:26 PM 03/02/2021   10:26 AM 12/20/2019    2:10 PM 03/24/2018   10:00 AM 04/28/2017    3:54 PM  Advanced Directives  Does Patient Have a Medical Advance Directive? Yes Yes Yes Yes Yes No  Type of Estate agent of [place];Living will Healthcare Power of [place];Living will Healthcare Power of [employer] of [place];Living will Healthcare Power of [place];Living will   Does patient want to make changes to medical advance directive? No - Patient declined No - Patient declined  No - Patient declined No - Patient declined   Copy of Healthcare Power of Attorney in Chart? Yes - validated most recent copy scanned in chart (See row information) Yes - validated most recent copy scanned in chart (See row information)  No - copy requested No - copy requested   Would patient like information on creating a medical advance directive?      No - Patient declined    Current Medications (verified) Outpatient Encounter Medications as of 07/24/2022  Medication Sig   acetaminophen (TYLENOL) 500 MG tablet Take 1,000 mg by mouth every 6 (six) hours as needed (for pain).   acetaminophen-codeine (TYLENOL #3) 300-30 MG tablet Take 1-2 tablets by mouth every 6 (six) hours as needed for moderate pain.   albuterol (VENTOLIN HFA) 108 (90 Base) MCG/ACT inhaler Inhale 2 puffs into the lungs every 6 (six) hours as needed for wheezing or shortness of breath.   atorvastatin (LIPITOR) 20 MG tablet  TAKE 1/2 TABLET BY MOUTH AT BEDTIME   cefdinir (OMNICEF) 300 MG capsule Take 1 capsule (300 mg total) by mouth 2 (two) times daily.   Cholecalciferol (VITAMIN D) 50 MCG (2000 UT) CAPS Take 1 capsule by mouth daily.   gabapentin (NEURONTIN) 100 MG capsule TAKE 2 CAPSULES BY MOUTH EVERY MORNING AND 3 CAPSULES AT BEDTIME   levothyroxine (SYNTHROID) 50 MCG tablet TAKE 1 TABLET(50 MCG) BY MOUTH DAILY   lisinopril (ZESTRIL) 20 MG tablet TAKE 1 TABLET(20 MG) BY MOUTH AT BEDTIME   LORazepam (ATIVAN) 1 MG tablet TAKE 1/2 TO 1 TABLET(0.5 TO 1 MG) BY MOUTH AT BEDTIME   methylPREDNISolone (MEDROL DOSEPAK) 4 MG TBPK tablet Take package as directed   Multiple Vitamins-Minerals (CENTRUM SILVER 50+MEN) TABS Take 1 tablet by mouth daily with breakfast.   pantoprazole (PROTONIX) 40 MG tablet TAKE 1 TABLET BY MOUTH EVERY DAY   tamsulosin (FLOMAX) 0.4 MG CAPS capsule TAKE 1 CAPSULE BY MOUTH AT BEDTIME   traMADol (ULTRAM) 50 MG tablet TAKE 1 TABLET(50 MG) BY MOUTH EVERY 6 HOURS FOR UP TO 5 DAYS AS NEEDED   No facility-administered encounter medications on file as of 07/24/2022.    Allergies (verified) Sulfamethoxazole-trimethoprim   History: Past Medical History:  Diagnosis Date   Arthritis    Diverticulitis    Diverticulitis    GERD (gastroesophageal reflux disease)    Heartburn  Hyperlipemia    Hypertension    IBS (irritable bowel syndrome)    Kidney stones    Past Surgical History:  Procedure Laterality Date   BACK SURGERY     EYE SURGERY  2016   cataract surgery   TONSILLECTOMY  1941   ulcer surgery     Family History  Problem Relation Age of Onset   Stomach cancer Mother    Hypertension Mother    Heart disease Father    Heart attack Father    Heart attack Sister    Stroke Maternal Grandfather    Heart attack Paternal Grandfather    Social History   Socioeconomic History   Marital status: Married    Spouse name: Not on file   Number of children: 2   Years of education: Not on  file   Highest education level: Not on file  Occupational History   Occupation: Advertising account planner    Comment: Retired  Tobacco Use   Smoking status: Former   Smokeless tobacco: Never   Tobacco comments:    Quit 60 years ago  Building services engineer Use: Never used  Substance and Sexual Activity   Alcohol use: No   Drug use: No   Sexual activity: Not Currently  Other Topics Concern   Not on file  Social History Narrative   Lives with wife on one level condo, recently downsized   Has two children, local, 4 grandchildren   Has one dog, Sam, enjoys walking it, but no other regular exercise   Enjoys doing Presenter, broadcasting, Curator   Social Determinants of Health   Financial Resource Strain: Low Risk  (07/24/2022)   Overall Financial Resource Strain (CARDIA)    Difficulty of Paying Living Expenses: Not hard at all  Food Insecurity: No Food Insecurity (07/24/2022)   Hunger Vital Sign    Worried About Running Out of Food in the Last Year: Never true    Ran Out of Food in the Last Year: Never true  Transportation Needs: No Transportation Needs (07/24/2022)   PRAPARE - Administrator, Civil Service (Medical): No    Lack of Transportation (Non-Medical): No  Physical Activity: Inactive (07/24/2022)   Exercise Vital Sign    Days of Exercise per Week: 0 days    Minutes of Exercise per Session: 0 min  Stress: No Stress Concern Present (07/24/2022)   [employer] of Occupational Health - Occupational Stress Questionnaire    Feeling of Stress : Not at all  Social Connections: Moderately Isolated (07/24/2022)   Social Connection and Isolation Panel [NHANES]    Frequency of Communication with Friends and Family: More than three times a week    Frequency of Social Gatherings with Friends and Family: More than three times a week    Attends Religious Services: Never    Database administrator or Organizations: No    Attends Engineer, structural: Never    Marital Status: Married     Tobacco Counseling Counseling given: Not Answered Tobacco comments: Quit 60 years ago   Clinical Intake:  Pre-visit preparation completed: No  Pain : No/denies pain     BMI - recorded: 21.81 Nutritional Status: BMI of 19-24  Normal Nutritional Risks: None Diabetes: No  How often do you need to have someone help you when you read instructions, pamphlets, or other written materials from your doctor or pharmacy?: 3 - Sometimes (Wife assist)  Diabetic?  No  Interpreter Needed?: No  Information entered by ::  Theresa Mulligan LPN   Activities of Daily Living    07/24/2022    4:00 PM  In your present state of health, do you have any difficulty performing the following activities:  Hearing? 1  Comment Wears hearing aids  Vision? 0  Difficulty concentrating or making decisions? 0  Walking or climbing stairs? 1  Comment Uses a cane  Dressing or bathing? 0  Doing errands, shopping? 1  Comment Family Ship broker and eating ? N  Using the Toilet? N  In the past six months, have you accidently leaked urine? N  Do you have problems with loss of bowel control? N  Managing your Medications? N  Managing your Finances? N  Housekeeping or managing your Housekeeping? N    Patient Care Team: Kristian Covey, MD as PCP - General (Family Medicine) Myrtie Neither Andreas Blower, MD as Consulting Physician (Gastroenterology) Nelson Chimes, MD as Consulting Physician (Ophthalmology) Verner Chol, [facility] (Inactive) as Pharmacist (Pharmacist)  Indicate any recent Medical Services you may have received from other than [facility] providers in the past year (date may be approximate).     Assessment:   This is a routine wellness examination for [employer].  Hearing/Vision screen Hearing Screening - Comments:: Wears hearing aids Vision Screening - Comments:: Wears rx glasses - up to date with routine eye exams with  Maggie Schwalbe  Dietary issues and exercise activities  discussed: Current Exercise Habits: The patient does not participate in regular exercise at present, Exercise limited by: orthopedic condition(s)   Goals Addressed               This Visit's Progress     Patient Stated (pt-stated)        Maintain independence.       Depression Screen    07/24/2022    3:39 PM 07/18/2021    1:21 PM 01/17/2021    9:16 AM 12/20/2019    2:13 PM 04/26/2019    2:11 PM 03/24/2018    9:30 AM 02/24/2018   11:05 AM  PHQ 2/9 Scores  PHQ - 2 Score 0 0 0 0 0 0 0  PHQ- 9 Score    0  0     Fall Risk    07/24/2022    4:02 PM 07/18/2021    1:24 PM 01/17/2021    9:16 AM 12/20/2019    2:12 PM 08/04/2019    8:42 AM  Fall Risk   Falls in the past year? 0 1 1 0 0  Number falls in past yr: 0 1 1 0 0  Injury with Fall? 0 0 0 0 0  Comment  No injury or medical attention needed     Risk for fall due to : No Fall Risks Other (Comment)  Impaired balance/gait   Risk for fall due to: Comment  Loss of balance     Follow up Falls prevention discussed   Falls evaluation completed;Falls prevention discussed Falls evaluation completed    FALL RISK PREVENTION PERTAINING TO THE HOME:  Any stairs in or around the home? No  If so, are there any without handrails? No  Home free of loose throw rugs in walkways, pet beds, electrical cords, etc? Yes  Adequate lighting in your home to reduce risk of falls? Yes   ASSISTIVE DEVICES UTILIZED TO PREVENT FALLS:  Life alert? Yes /Watch Use of a cane, walker or w/c? Yes  Grab bars in the bathroom? Yes  Shower chair or bench in shower? Yes  Elevated toilet seat or a handicapped toilet? Yes   TIMED UP AND GO:  Was the test performed? Yes .  Length of time to ambulate 10 feet: 10 sec.   Gait slow and steady with assistive device  Cognitive Function:    03/24/2018    9:43 AM  MMSE - Mini Mental State Exam  Orientation to time 5  Orientation to Place 5  Registration 3  Attention/ Calculation 4  Recall 3  Language- name 2  objects 1  Language- repeat 1  Language- follow 3 step command 3  Language- read & follow direction 1  Write a sentence 1  Copy design 1  Total score 28        07/24/2022    4:03 PM 07/18/2021    1:26 PM  6CIT Screen  What Year? 0 points 0 points  What month? 0 points 0 points  What time? 0 points 0 points  Count back from 20 0 points 0 points  Months in reverse 0 points 0 points  Repeat phrase 0 points 0 points  Total Score 0 points 0 points    Immunizations Immunization History  Administered Date(s) Administered   Fluad Quad(high Dose 65+) 10/22/2018, 12/07/2019, 10/23/2021   Influenza Split 11/16/2010   Influenza Whole 11/13/2006, 11/17/2006, 11/13/2007, 11/23/2009   Influenza, High Dose Seasonal PF 10/27/2015, 11/18/2016, 10/11/2020   Influenza, Seasonal, Injecte, Preservative Fre 11/25/2014   Influenza,inj,Quad PF,6+ Mos 11/27/2017   Influenza,trivalent, recombinat, inj, PF 11/10/2013   PFIZER(Purple Top)SARS-COV-2 Vaccination 03/19/2019, 04/13/2019, 12/14/2019   Pneumococcal Conjugate-13 07/06/2014   Pneumococcal Polysaccharide-23 02/21/1999   Pneumococcal-Unspecified 02/21/1999   Td 02/11/1997, 08/19/2008   Tdap 08/11/2008    TDAP status: Due, Education has been provided regarding the importance of this vaccine. Advised may receive this vaccine at local pharmacy or Health Dept. Aware to provide a copy of the vaccination record if obtained from local pharmacy or Health Dept. Verbalized acceptance and understanding.  Flu Vaccine status: Up to date  Pneumococcal vaccine status: Up to date    Qualifies for Shingles Vaccine? Yes   Zostavax completed No   Shingrix Completed?: No.    Education has been provided regarding the importance of this vaccine. Patient has been advised to call insurance company to determine out of pocket expense if they have not yet received this vaccine. Advised may also receive vaccine at local pharmacy or Health Dept. Verbalized acceptance  and understanding.  Screening Tests Health Maintenance  Topic Date Due   DTaP/Tdap/Td (4 - Td or Tdap) 08/20/2018   Zoster Vaccines- Shingrix (1 of 2) 10/24/2022 (Originally 12/23/1950)   INFLUENZA VACCINE  09/12/2022   Medicare Annual Wellness (AWV)  07/24/2023   Pneumonia Vaccine 40+ Years old  Completed   HPV VACCINES  Aged Out   COVID-19 Vaccine  Discontinued    Health Maintenance  Health Maintenance Due  Topic Date Due   DTaP/Tdap/Td (4 - Td or Tdap) 08/20/2018    Colorectal cancer screening: No longer required.   Lung Cancer Screening: (Low Dose CT Chest recommended if Age 77-80 years, 30 pack-year currently smoking OR have quit w/in 15years.)  qualify.     Additional Screening:  Hepatitis C Screening: does not qualify; Completed   Vision Screening: Recommended annual ophthalmology exams for early detection of glaucoma and other disorders of the eye. Is the patient up to date with their annual eye exam?  Yes  Who is the provider or what is the name of the office in which the patient  attends annual eye exams? [facility] If pt is not established with a provider, would they like to be referred to a provider to establish care? No .   Dental Screening: Recommended annual dental exams for proper oral hygiene  Community Resource Referral / Chronic Care Management:  CRR required this visit?  No   CCM required this visit?  No      Plan:     I have personally reviewed and noted the following in the patient's chart:   Medical and social history Use of alcohol, tobacco or illicit drugs  Current medications and supplements including opioid prescriptions. Patient is currently taking opioid prescriptions. Information provided to patient regarding non-opioid alternatives. Patient advised to discuss non-opioid treatment plan with their provider. Functional ability and status Nutritional status Physical activity Advanced directives List of other  physicians Hospitalizations, surgeries, and ER visits in previous 12 months Vitals Screenings to include cognitive, depression, and falls Referrals and appointments  In addition, I have reviewed and discussed with patient certain preventive protocols, quality metrics, and best practice recommendations. A written personalized care plan for preventive services as well as general preventive health recommendations were provided to patient.     Tillie Rung, LPN   05/20/8117   Nurse Notes: None

## 2022-07-26 DIAGNOSIS — M48061 Spinal stenosis, lumbar region without neurogenic claudication: Secondary | ICD-10-CM | POA: Diagnosis not present

## 2022-08-06 ENCOUNTER — Other Ambulatory Visit: Payer: Self-pay | Admitting: Family Medicine

## 2022-08-14 DIAGNOSIS — M48061 Spinal stenosis, lumbar region without neurogenic claudication: Secondary | ICD-10-CM | POA: Diagnosis not present

## 2022-08-17 ENCOUNTER — Other Ambulatory Visit: Payer: Self-pay | Admitting: Family Medicine

## 2022-08-17 DIAGNOSIS — E039 Hypothyroidism, unspecified: Secondary | ICD-10-CM

## 2022-08-17 DIAGNOSIS — I10 Essential (primary) hypertension: Secondary | ICD-10-CM

## 2022-08-19 ENCOUNTER — Other Ambulatory Visit: Payer: Self-pay | Admitting: Family Medicine

## 2022-08-19 DIAGNOSIS — I10 Essential (primary) hypertension: Secondary | ICD-10-CM

## 2022-08-19 DIAGNOSIS — E039 Hypothyroidism, unspecified: Secondary | ICD-10-CM

## 2022-08-30 DIAGNOSIS — M48062 Spinal stenosis, lumbar region with neurogenic claudication: Secondary | ICD-10-CM | POA: Diagnosis not present

## 2022-09-10 DIAGNOSIS — M48062 Spinal stenosis, lumbar region with neurogenic claudication: Secondary | ICD-10-CM | POA: Diagnosis not present

## 2022-09-12 DIAGNOSIS — H6122 Impacted cerumen, left ear: Secondary | ICD-10-CM | POA: Diagnosis not present

## 2022-10-01 ENCOUNTER — Other Ambulatory Visit: Payer: Self-pay | Admitting: Family Medicine

## 2022-10-01 DIAGNOSIS — E039 Hypothyroidism, unspecified: Secondary | ICD-10-CM

## 2022-10-01 DIAGNOSIS — I10 Essential (primary) hypertension: Secondary | ICD-10-CM

## 2022-10-15 ENCOUNTER — Observation Stay (HOSPITAL_COMMUNITY): Payer: Medicare Other

## 2022-10-15 ENCOUNTER — Inpatient Hospital Stay (HOSPITAL_COMMUNITY)
Admission: EM | Admit: 2022-10-15 | Discharge: 2022-10-17 | DRG: 177 | Disposition: A | Discharge status: Home / Self Care | Payer: Medicare Other | Attending: Internal Medicine | Admitting: Internal Medicine

## 2022-10-15 ENCOUNTER — Encounter (HOSPITAL_COMMUNITY): Payer: Self-pay

## 2022-10-15 ENCOUNTER — Other Ambulatory Visit: Payer: Self-pay

## 2022-10-15 ENCOUNTER — Emergency Department (HOSPITAL_COMMUNITY): Payer: Medicare Other

## 2022-10-15 DIAGNOSIS — Z823 Family history of stroke: Secondary | ICD-10-CM

## 2022-10-15 DIAGNOSIS — H9190 Unspecified hearing loss, unspecified ear: Secondary | ICD-10-CM | POA: Diagnosis not present

## 2022-10-15 DIAGNOSIS — E039 Hypothyroidism, unspecified: Secondary | ICD-10-CM | POA: Diagnosis not present

## 2022-10-15 DIAGNOSIS — J1282 Pneumonia due to coronavirus disease 2019: Secondary | ICD-10-CM | POA: Diagnosis present

## 2022-10-15 DIAGNOSIS — R000 Tachycardia, unspecified: Secondary | ICD-10-CM | POA: Diagnosis not present

## 2022-10-15 DIAGNOSIS — K219 Gastro-esophageal reflux disease without esophagitis: Secondary | ICD-10-CM | POA: Diagnosis not present

## 2022-10-15 DIAGNOSIS — N1832 Chronic kidney disease, stage 3b: Secondary | ICD-10-CM | POA: Diagnosis present

## 2022-10-15 DIAGNOSIS — J159 Unspecified bacterial pneumonia: Secondary | ICD-10-CM | POA: Diagnosis present

## 2022-10-15 DIAGNOSIS — A419 Sepsis, unspecified organism: Secondary | ICD-10-CM | POA: Diagnosis not present

## 2022-10-15 DIAGNOSIS — Z800 Family history of malignant neoplasm of digestive organs: Secondary | ICD-10-CM | POA: Diagnosis not present

## 2022-10-15 DIAGNOSIS — Z79890 Hormone replacement therapy (postmenopausal): Secondary | ICD-10-CM | POA: Diagnosis not present

## 2022-10-15 DIAGNOSIS — Z8249 Family history of ischemic heart disease and other diseases of the circulatory system: Secondary | ICD-10-CM

## 2022-10-15 DIAGNOSIS — N183 Chronic kidney disease, stage 3 unspecified: Secondary | ICD-10-CM | POA: Diagnosis not present

## 2022-10-15 DIAGNOSIS — J9601 Acute respiratory failure with hypoxia: Secondary | ICD-10-CM | POA: Diagnosis not present

## 2022-10-15 DIAGNOSIS — I491 Atrial premature depolarization: Secondary | ICD-10-CM | POA: Diagnosis not present

## 2022-10-15 DIAGNOSIS — R509 Fever, unspecified: Secondary | ICD-10-CM | POA: Diagnosis not present

## 2022-10-15 DIAGNOSIS — R0902 Hypoxemia: Secondary | ICD-10-CM | POA: Diagnosis not present

## 2022-10-15 DIAGNOSIS — Z882 Allergy status to sulfonamides status: Secondary | ICD-10-CM | POA: Diagnosis not present

## 2022-10-15 DIAGNOSIS — N400 Enlarged prostate without lower urinary tract symptoms: Secondary | ICD-10-CM | POA: Diagnosis not present

## 2022-10-15 DIAGNOSIS — I2510 Atherosclerotic heart disease of native coronary artery without angina pectoris: Secondary | ICD-10-CM | POA: Diagnosis not present

## 2022-10-15 DIAGNOSIS — Z79899 Other long term (current) drug therapy: Secondary | ICD-10-CM

## 2022-10-15 DIAGNOSIS — J202 Acute bronchitis due to streptococcus: Secondary | ICD-10-CM

## 2022-10-15 DIAGNOSIS — Z87891 Personal history of nicotine dependence: Secondary | ICD-10-CM | POA: Diagnosis not present

## 2022-10-15 DIAGNOSIS — U071 COVID-19: Principal | ICD-10-CM | POA: Diagnosis present

## 2022-10-15 DIAGNOSIS — E785 Hyperlipidemia, unspecified: Secondary | ICD-10-CM | POA: Diagnosis not present

## 2022-10-15 DIAGNOSIS — R531 Weakness: Secondary | ICD-10-CM | POA: Diagnosis not present

## 2022-10-15 DIAGNOSIS — G4489 Other headache syndrome: Secondary | ICD-10-CM | POA: Diagnosis not present

## 2022-10-15 DIAGNOSIS — I10 Essential (primary) hypertension: Secondary | ICD-10-CM | POA: Diagnosis present

## 2022-10-15 DIAGNOSIS — R918 Other nonspecific abnormal finding of lung field: Secondary | ICD-10-CM | POA: Diagnosis not present

## 2022-10-15 DIAGNOSIS — J189 Pneumonia, unspecified organism: Secondary | ICD-10-CM | POA: Diagnosis not present

## 2022-10-15 DIAGNOSIS — I959 Hypotension, unspecified: Secondary | ICD-10-CM | POA: Diagnosis not present

## 2022-10-15 LAB — CBC WITH DIFFERENTIAL/PLATELET
Abs Immature Granulocytes: 0 10*3/uL (ref 0.00–0.07)
Band Neutrophils: 18 %
Basophils Absolute: 0 10*3/uL (ref 0.0–0.1)
Basophils Relative: 0 %
Eosinophils Absolute: 0 10*3/uL (ref 0.0–0.5)
Eosinophils Relative: 0 %
HCT: 35.1 % — ABNORMAL LOW (ref 39.0–52.0)
Hemoglobin: 11.5 g/dL — ABNORMAL LOW (ref 13.0–17.0)
Lymphocytes Relative: 11 %
Lymphs Abs: 0.8 10*3/uL (ref 0.7–4.0)
MCH: 33.7 pg (ref 26.0–34.0)
MCHC: 32.8 g/dL (ref 30.0–36.0)
MCV: 102.9 fL — ABNORMAL HIGH (ref 80.0–100.0)
Monocytes Absolute: 1 10*3/uL (ref 0.1–1.0)
Monocytes Relative: 14 %
Neutro Abs: 5.4 10*3/uL (ref 1.7–7.7)
Neutrophils Relative %: 57 %
Platelets: 137 10*3/uL — ABNORMAL LOW (ref 150–400)
RBC: 3.41 MIL/uL — ABNORMAL LOW (ref 4.22–5.81)
RDW: 14.3 % (ref 11.5–15.5)
WBC: 7.2 10*3/uL (ref 4.0–10.5)
nRBC: 0 % (ref 0.0–0.2)

## 2022-10-15 LAB — PROTIME-INR
INR: 1 (ref 0.8–1.2)
Prothrombin Time: 13.8 s (ref 11.4–15.2)

## 2022-10-15 LAB — COMPREHENSIVE METABOLIC PANEL
ALT: 20 U/L (ref 0–44)
AST: 28 U/L (ref 15–41)
Albumin: 3.3 g/dL — ABNORMAL LOW (ref 3.5–5.0)
Alkaline Phosphatase: 38 U/L (ref 38–126)
Anion gap: 13 (ref 5–15)
BUN: 27 mg/dL — ABNORMAL HIGH (ref 8–23)
CO2: 19 mmol/L — ABNORMAL LOW (ref 22–32)
Calcium: 8.4 mg/dL — ABNORMAL LOW (ref 8.9–10.3)
Chloride: 105 mmol/L (ref 98–111)
Creatinine, Ser: 1.51 mg/dL — ABNORMAL HIGH (ref 0.61–1.24)
GFR, Estimated: 44 mL/min — ABNORMAL LOW (ref >60)
Glucose, Bld: 124 mg/dL — ABNORMAL HIGH (ref 70–99)
Potassium: 3.7 mmol/L (ref 3.5–5.1)
Sodium: 137 mmol/L (ref 135–145)
Total Bilirubin: 0.7 mg/dL (ref 0.3–1.2)
Total Protein: 6 g/dL — ABNORMAL LOW (ref 6.5–8.1)

## 2022-10-15 LAB — URINALYSIS, W/ REFLEX TO CULTURE (INFECTION SUSPECTED)
Bilirubin Urine: NEGATIVE
Glucose, UA: NEGATIVE mg/dL
Ketones, ur: 5 mg/dL — AB
Leukocytes,Ua: NEGATIVE
Nitrite: NEGATIVE
Protein, ur: NEGATIVE mg/dL
Specific Gravity, Urine: 1.009 (ref 1.005–1.030)
pH: 5 (ref 5.0–8.0)

## 2022-10-15 LAB — I-STAT CG4 LACTIC ACID, ED: Lactic Acid, Venous: 1.1 mmol/L (ref 0.5–1.9)

## 2022-10-15 MED ORDER — DEXAMETHASONE SODIUM PHOSPHATE 10 MG/ML IJ SOLN
6.0000 mg | INTRAMUSCULAR | Status: DC
  Administered 2022-10-15: 6 mg via INTRAVENOUS
  Filled 2022-10-15: qty 1

## 2022-10-15 MED ORDER — PANTOPRAZOLE SODIUM 40 MG PO TBEC: 40.0000 mg | DELAYED_RELEASE_TABLET | Freq: Every day | ORAL | Status: DC

## 2022-10-15 MED ORDER — POLYETHYLENE GLYCOL 3350 17 G PO PACK: 17.0000 g | PACK | Freq: Every day | ORAL | Status: DC | PRN

## 2022-10-15 MED ORDER — SODIUM CHLORIDE 0.9% FLUSH
3.0000 mL | Freq: Two times a day (BID) | INTRAVENOUS | Status: DC
  Administered 2022-10-15 – 2022-10-17 (×5): 3 mL via INTRAVENOUS

## 2022-10-15 MED ORDER — IOHEXOL 350 MG/ML SOLN
75.0000 mL | Freq: Once | INTRAVENOUS | Status: AC | PRN
  Administered 2022-10-15: 75 mL via INTRAVENOUS

## 2022-10-15 MED ORDER — TAMSULOSIN HCL 0.4 MG PO CAPS
0.4000 mg | ORAL_CAPSULE | Freq: Every day | ORAL | Status: DC
  Administered 2022-10-15 – 2022-10-16 (×2): 0.4 mg via ORAL
  Filled 2022-10-15 (×2): qty 1

## 2022-10-15 MED ORDER — ATORVASTATIN CALCIUM 10 MG PO TABS: 10.0000 mg | ORAL_TABLET | Freq: Every day | ORAL | Status: DC

## 2022-10-15 MED ORDER — SODIUM CHLORIDE 0.9 % IV SOLN
1.0000 g | INTRAVENOUS | Status: DC
  Administered 2022-10-15: 1 g via INTRAVENOUS
  Filled 2022-10-15: qty 10

## 2022-10-15 MED ORDER — ACETAMINOPHEN 325 MG PO TABS
650.0000 mg | ORAL_TABLET | Freq: Four times a day (QID) | ORAL | Status: DC | PRN
  Administered 2022-10-16: 650 mg via ORAL
  Filled 2022-10-15: qty 2

## 2022-10-15 MED ORDER — SODIUM CHLORIDE 0.9 % IV BOLUS
500.0000 mL | Freq: Once | INTRAVENOUS | Status: AC
  Administered 2022-10-15: 500 mL via INTRAVENOUS

## 2022-10-15 MED ORDER — ENOXAPARIN SODIUM 30 MG/0.3ML IJ SOSY
30.0000 mg | PREFILLED_SYRINGE | INTRAMUSCULAR | Status: DC
  Administered 2022-10-15: 30 mg via SUBCUTANEOUS
  Filled 2022-10-15: qty 0.3

## 2022-10-15 MED ORDER — ACETAMINOPHEN 650 MG RE SUPP: 650.0000 mg | Freq: Four times a day (QID) | RECTAL | Status: DC | PRN

## 2022-10-15 MED ORDER — NIRMATRELVIR/RITONAVIR (PAXLOVID) TABLET (RENAL DOSING)
2.0000 | ORAL_TABLET | Freq: Two times a day (BID) | ORAL | Status: DC
  Administered 2022-10-15: 2 via ORAL
  Filled 2022-10-15: qty 20

## 2022-10-15 MED ORDER — SODIUM CHLORIDE 0.9 % IV SOLN: INTRAVENOUS | Status: DC

## 2022-10-15 MED ORDER — LEVOTHYROXINE SODIUM 50 MCG PO TABS
50.0000 ug | ORAL_TABLET | Freq: Every day | ORAL | Status: DC
  Administered 2022-10-16 – 2022-10-17 (×2): 50 ug via ORAL
  Filled 2022-10-15 (×2): qty 1

## 2022-10-15 MED ORDER — SODIUM CHLORIDE 0.9 % IV SOLN
500.0000 mg | Freq: Once | INTRAVENOUS | Status: AC
  Administered 2022-10-15: 500 mg via INTRAVENOUS
  Filled 2022-10-15: qty 5

## 2022-10-15 NOTE — ED Provider Notes (Signed)
EMERGENCY DEPARTMENT AT [facility] Provider Note   CSN: [id number] Arrival date & time: 10/15/22  1101     History  Chief Complaint  Patient presents with  . Covid Positive    Timothy Garrett is a 89 y.o. male.  HPI   89 year old male presents the emergency department with concern for productive cough, fever, shortness of breath.  Recently diagnosed as COVID-positive a couple days ago.  Was febrile at the doctor's office, given ibuprofen.  Patient is very hard of hearing so history is limited, does have family member at bedside that aids in information.  They deny any active chest pain or GI symptoms.  Home Medications Prior to Admission medications   Medication Sig Start Date End Date Taking? Authorizing Provider  acetaminophen (TYLENOL) 500 MG tablet Take 1,000 mg by mouth every 6 (six) hours as needed (for pain).    [provider]  acetaminophen-codeine (TYLENOL #3) 300-30 MG tablet Take 1-2 tablets by mouth every 6 (six) hours as needed for moderate pain. 03/06/21   Burchette, Elberta Fortis, MD  albuterol (VENTOLIN HFA) 108 (90 Base) MCG/ACT inhaler Inhale 2 puffs into the lungs every 6 (six) hours as needed for wheezing or shortness of breath. 12/31/21   Karie Georges, MD  atorvastatin (LIPITOR) 20 MG tablet TAKE 1/2 TABLET BY MOUTH AT BEDTIME 10/02/22   Burchette, Elberta Fortis, MD  cefdinir (OMNICEF) 300 MG capsule Take 1 capsule (300 mg total) by mouth 2 (two) times daily. 12/31/21   Karie Georges, MD  Cholecalciferol (VITAMIN D) 50 MCG (2000 UT) CAPS Take 1 capsule by mouth daily.    [provider]  gabapentin (NEURONTIN) 100 MG capsule TAKE 2 CAPSULES BY MOUTH EVERY MORNING AND 3 CAPSULES AT BEDTIME 06/05/22   Burchette, Elberta Fortis, MD  levothyroxine (SYNTHROID) 50 MCG tablet TAKE 1 TABLET(50 MCG) BY MOUTH DAILY 10/02/22   Burchette, Elberta Fortis, MD  lisinopril (ZESTRIL) 20 MG tablet TAKE 1 TABLET(20 MG) BY MOUTH AT BEDTIME 10/02/22    Burchette, Elberta Fortis, MD  LORazepam (ATIVAN) 1 MG tablet TAKE 1/2 TO 1 TABLET(0.5 TO 1 MG) BY MOUTH AT BEDTIME 04/14/22   Burchette, Elberta Fortis, MD  methylPREDNISolone (MEDROL DOSEPAK) 4 MG TBPK tablet Take package as directed 12/31/21   Karie Georges, MD  Multiple Vitamins-Minerals (CENTRUM SILVER 50+MEN) TABS Take 1 tablet by mouth daily with breakfast.    [provider]  pantoprazole (PROTONIX) 40 MG tablet TAKE 1 TABLET BY MOUTH EVERY DAY 07/05/22   Burchette, Elberta Fortis, MD  tamsulosin (FLOMAX) 0.4 MG CAPS capsule TAKE 1 CAPSULE BY MOUTH AT BEDTIME 10/02/22   Burchette, Elberta Fortis, MD  traMADol (ULTRAM) 50 MG tablet TAKE 1 TABLET(50 MG) BY MOUTH EVERY 6 HOURS FOR UP TO 5 DAYS AS NEEDED 02/22/21   Burchette, Elberta Fortis, MD      Allergies    Sulfamethoxazole-trimethoprim    Review of Systems   Review of Systems  Constitutional:  Positive for appetite change, chills, fatigue and fever.  Respiratory:  Positive for cough and shortness of breath.   Cardiovascular:  Negative for chest pain.  Gastrointestinal:  Negative for abdominal pain, diarrhea and vomiting.  Skin:  Negative for rash.  Neurological:  Negative for headaches.    Physical Exam Updated Vital Signs BP (!) 86/54   Pulse 90   Temp 99 F (37.2 C) (Oral)   Resp 20   Ht 5\' 6"  (1.676 m)   Wt 61 kg  SpO2 98%   BMI 21.71 kg/m  Physical Exam Vitals and nursing note reviewed.  Constitutional:      General: He is not in acute distress.    Appearance: Normal appearance.  HENT:     Head: Normocephalic.     Mouth/Throat:     Mouth: Mucous membranes are moist.  Cardiovascular:     Rate and Rhythm: Normal rate.  Pulmonary:     Effort: Pulmonary effort is normal. No respiratory distress.     Breath sounds: Rales present.  Abdominal:     Palpations: Abdomen is soft.     Tenderness: There is no abdominal tenderness.  Musculoskeletal:        General: No swelling.  Skin:    General: Skin is warm.  Neurological:     Mental  Status: He is alert and oriented to person, place, and time. Mental status is at baseline.  Psychiatric:        Mood and Affect: Mood normal.    ED Results / Procedures / Treatments   Labs (all labs ordered are listed, but only abnormal results are displayed) Labs Reviewed  CBC WITH DIFFERENTIAL/PLATELET - Abnormal; Notable for the following components:      Result Value   RBC 3.41 (*)    Hemoglobin 11.5 (*)    HCT 35.1 (*)    MCV 102.9 (*)    Platelets 137 (*)    All other components within normal limits  CULTURE, BLOOD (ROUTINE X 2)  CULTURE, BLOOD (ROUTINE X 2)  PROTIME-INR  COMPREHENSIVE METABOLIC PANEL  URINALYSIS, W/ REFLEX TO CULTURE (INFECTION SUSPECTED)  I-STAT CG4 LACTIC ACID, ED    EKG EKG Interpretation Date/Time:  Tuesday October 15 2022 11:20:16 EDT Ventricular Rate:  94 PR Interval:  161 QRS Duration:  82 QT Interval:  341 QTC Calculation: 427 R Axis:   39  Text Interpretation: Sinus rhythm Atrial premature complex Low voltage, precordial leads Abnormal R-wave progression, early transition Confirmed by Coralee Pesa [phone]) on 10/15/2022 12:26:21 PM  Radiology No results found.  Procedures .Critical Care  Performed by: Rozelle Logan, DO Authorized by: Rozelle Logan, DO   Critical care provider statement:    Critical care time (minutes):  45   Critical care time was exclusive of:  Separately billable procedures and treating other patients   Critical care was necessary to treat or prevent imminent or life-threatening deterioration of the following conditions:  Respiratory failure and circulatory failure   Critical care was time spent personally by me on the following activities:  Development of treatment plan with patient or surrogate, discussions with consultants, evaluation of patient's response to treatment, examination of patient, ordering and review of laboratory studies, ordering and review of radiographic studies, ordering and performing  treatments and interventions, pulse oximetry, re-evaluation of patient's condition and review of old charts   I assumed direction of critical care for this patient from another provider in my specialty: no     Care discussed with: admitting provider       Medications Ordered in ED Medications - No data to display  ED Course/ Medical Decision Making/ A&P                                 Medical Decision Making Amount and/or Complexity of Data Reviewed Labs: ordered. Radiology: ordered.  Risk Decision regarding hospitalization.   89 year old male presents emergency department with concern for shortness of  breath, fever, hypotension.  Noted to be hypoxic and requiring 2 L nasal cannula on arrival which is a new requirement for him.  Patient is very hard of hearing so history is limited but feeling members at bedside help.  He was recently diagnosed with COVID.  Blood work is reassuring, mild AKI when compared to baseline.  Lactic acid is normal.  No significant leukocytosis, no findings of septic shock.  Chest x-ray identifies a possible area of developing pneumonia, retrocardiac.  With IV hydration his blood pressure has improved but continues to be soft.  Given the hypotension, hypoxia in the setting of what appears to be COVID-pneumonia will treat with IV antibiotics and plan to admit.  Patient is otherwise protecting his airway and in no respiratory distress.  Will also pursue a CT study to rule out PE and further identify pulmonary infection.  Patients evaluation and results requires admission for further treatment and care.  Spoke with hospitalist, reviewed patient's ED course and they accept admission.  Patient agrees with admission plan, offers no new complaints and is stable/unchanged at time of admit.         Final Clinical Impression(s) / ED Diagnoses Final diagnoses:  None    Rx / DC Orders ED Discharge Orders     None         Rozelle Logan, DO 10/15/22  1456

## 2022-10-15 NOTE — ED Notes (Signed)
ED TO INPATIENT HANDOFF REPORT  ED Nurse Name and Phone #:  Theadora Rama RN [phone]  S Name/Age/Gender Timothy Garrett 89 y.o. male Room/Bed: 036C/036C  Code Status   Code Status: Full Code  Home/SNF/Other Home Patient oriented to: self, place, time, and situation Is this baseline? Yes   Triage Complete: Triage complete  Chief Complaint Acute respiratory failure with hypoxia (HCC) [J96.01]  Triage Note Pt present to ED from doctor's office with c/o shortness of breath, weakness, cough, fever, poor appetite. Pt recently dx with covid two days ago. Pt noted with temp of 102 at doctor's office, was given ibuprofen. Pt also received NS prior ED arrival. Pt A&Ox3 at times, HOH to right ear.   EMS VS: 90/60 98 95% 2L O2 CBG 138 99.66F(oral)   Allergies Allergies  Allergen Reactions   Bactrim [Sulfamethoxazole-Trimethoprim] Itching    Level of Care/Admitting Diagnosis ED Disposition     ED Disposition  Admit   Condition  --   Comment  Hospital Area: [facility] [[id number]]  Level of Care: Progressive [102]  Admit to Progressive based on following criteria: MULTISYSTEM THREATS such as stable sepsis, metabolic/electrolyte imbalance with or without encephalopathy that is responding to early treatment.  Admit to Progressive based on following criteria: Other see comments  Comments: Intermittent hypotension, covid, hypoxia  May place patient in observation at [facility] or [facility] if equivalent level of care is available:: No  Covid Evaluation: Confirmed COVID Positive  Diagnosis: Acute respiratory failure with hypoxia [facility]) [[id number]]  Admitting Physician: Synetta Fail [[id number]]  Attending Physician: Synetta Fail [phone]          B Medical/Surgery History Past Medical History:  Diagnosis Date   Arthritis    Diverticulitis    Diverticulitis    GERD (gastroesophageal reflux disease)    Heartburn   Hyperlipemia     Hypertension    IBS (irritable bowel syndrome)    Kidney stones    Past Surgical History:  Procedure Laterality Date   BACK SURGERY     EYE SURGERY  2016   cataract surgery   TONSILLECTOMY  1941   ulcer surgery       A IV Location/Drains/Wounds Patient Lines/Drains/Airways Status     Active Line/Drains/Airways     Name Placement date Placement time Site Days   Peripheral IV 10/15/22 20 G 1" Anterior;Distal;Left;Upper Arm 10/15/22  1126  Arm  less than 1   Peripheral IV 10/15/22 20 G 1" Posterior;Right Forearm 10/15/22  1150  Forearm  less than 1            Intake/Output Last 24 hours  Intake/Output Summary (Last 24 hours) at 10/15/2022 2130 Last data filed at 10/15/2022 1827 Gross per 24 hour  Intake 350 ml  Output 300 ml  Net 50 ml    Labs/Imaging Results for orders placed or performed during the hospital encounter of 10/15/22 (from the past 48 hour(s))  Comprehensive metabolic panel     Status: Abnormal   Collection Time: 10/15/22 11:35 AM  Result Value Ref Range   Sodium 137 135 - 145 mmol/L   Potassium 3.7 3.5 - 5.1 mmol/L   Chloride 105 98 - 111 mmol/L   CO2 19 (L) 22 - 32 mmol/L   Glucose, Bld 124 (H) 70 - 99 mg/dL    Comment: Glucose reference range applies only to samples taken after fasting for at least 8 hours.   BUN 27 (H) 8 - 23  mg/dL   Creatinine, Ser 1.61 (H) 0.61 - 1.24 mg/dL   Calcium 8.4 (L) 8.9 - 10.3 mg/dL   Total Protein 6.0 (L) 6.5 - 8.1 g/dL   Albumin 3.3 (L) 3.5 - 5.0 g/dL   AST 28 15 - 41 U/L   ALT 20 0 - 44 U/L   Alkaline Phosphatase 38 38 - 126 U/L   Total Bilirubin 0.7 0.3 - 1.2 mg/dL   GFR, Estimated 44 (L) >60 mL/min    Comment: (NOTE) Calculated using the CKD-EPI Creatinine Equation (2021)    Anion gap 13 5 - 15    Comment: Performed at [facility], [address] [place], [place] [address]  CBC with Differential     Status: Abnormal   Collection Time: 10/15/22 11:35 AM  Result Value Ref Range   WBC 7.2 4.0 - 10.5  K/uL   RBC 3.41 (L) 4.22 - 5.81 MIL/uL   Hemoglobin 11.5 (L) 13.0 - 17.0 g/dL   HCT 54.0 (L) 98.1 - 19.1 %   MCV 102.9 (H) 80.0 - 100.0 fL   MCH 33.7 26.0 - 34.0 pg   MCHC 32.8 30.0 - 36.0 g/dL   RDW 47.8 29.5 - 62.1 %   Platelets 137 (L) 150 - 400 K/uL   nRBC 0.0 0.0 - 0.2 %   Neutrophils Relative % 57 %   Neutro Abs 5.4 1.7 - 7.7 K/uL   Band Neutrophils 18 %   Lymphocytes Relative 11 %   Lymphs Abs 0.8 0.7 - 4.0 K/uL   Monocytes Relative 14 %   Monocytes Absolute 1.0 0.1 - 1.0 K/uL   Eosinophils Relative 0 %   Eosinophils Absolute 0.0 0.0 - 0.5 K/uL   Basophils Relative 0 %   Basophils Absolute 0.0 0.0 - 0.1 K/uL   Abs Immature Granulocytes 0.00 0.00 - 0.07 K/uL    Comment: Performed at [facility], [address] [place], [place] [address]  Protime-INR     Status: None   Collection Time: 10/15/22 11:35 AM  Result Value Ref Range   Prothrombin Time 13.8 11.4 - 15.2 seconds   INR 1.0 0.8 - 1.2    Comment: (NOTE) INR goal varies based on device and disease states. Performed at [facility], [address], [place], [place] [address]   I-Stat Lactic Acid, ED     Status: None   Collection Time: 10/15/22 12:05 PM  Result Value Ref Range   Lactic Acid, Venous 1.1 0.5 - 1.9 mmol/L  Urinalysis, w/ Reflex to Culture (Infection Suspected) -Urine, Clean Catch     Status: Abnormal   Collection Time: 10/15/22  2:55 PM  Result Value Ref Range   Specimen Source URINE, CATHETERIZED    Color, Urine YELLOW YELLOW   APPearance HAZY (A) CLEAR   Specific Gravity, Urine 1.009 1.005 - 1.030   pH 5.0 5.0 - 8.0   Glucose, UA NEGATIVE NEGATIVE mg/dL   Hgb urine dipstick SMALL (A) NEGATIVE   Bilirubin Urine NEGATIVE NEGATIVE   Ketones, ur 5 (A) NEGATIVE mg/dL   Protein, ur NEGATIVE NEGATIVE mg/dL   Nitrite NEGATIVE NEGATIVE   Leukocytes,Ua NEGATIVE NEGATIVE   RBC / HPF 0-5 0 - 5 RBC/hpf   WBC, UA 0-5 0 - 5 WBC/hpf    Comment:        Reflex urine culture not performed if  WBC <=10, OR if Squamous epithelial cells >5. If Squamous epithelial cells >5 suggest recollection.    Bacteria,  UA RARE (A) NONE SEEN   Squamous Epithelial / HPF 0-5 0 - 5 /HPF   Mucus PRESENT    Sperm, UA PRESENT     Comment: Performed at [facility], [address], [place], [place] [address]   CT Angio Chest PE W/Cm &/Or Wo Cm  Result Date: 10/15/2022 CLINICAL DATA:  Pulmonary embolism (PE) suspected, low to intermediate prob, positive D-dimer. COVID. Sepsis EXAM: CT ANGIOGRAPHY CHEST WITH CONTRAST TECHNIQUE: Multidetector CT imaging of the chest was performed using the standard protocol during bolus administration of intravenous contrast. Multiplanar CT image reconstructions and MIPs were obtained to evaluate the vascular anatomy. RADIATION DOSE REDUCTION: This exam was performed according to the departmental dose-optimization program which includes automated exposure control, adjustment of the mA and/or kV according to patient size and/or use of iterative reconstruction technique. CONTRAST:  75mL OMNIPAQUE IOHEXOL 350 MG/ML SOLN COMPARISON:  CT abdomen pelvis 03/02/2021 FINDINGS: Cardiovascular: Satisfactory opacification of the pulmonary arteries to the segmental level. No evidence of pulmonary embolism. The main pulmonary artery is normal in caliber. Normal heart size. No significant pericardial effusion. The thoracic aorta is normal in caliber. Moderate atherosclerotic plaque of the thoracic aorta. Four-vessel coronary artery calcifications. Mediastinum/Nodes: No enlarged mediastinal, hilar, or axillary lymph nodes. Thyroid gland, trachea, and esophagus demonstrate no significant findings. Lungs/Pleura: Biapical pleural/pulmonary scarring. Interval development of tree-in-bud nodularity within the bilateral lower lobes. Mild bronchial wall thickening associated with this. Peribronchovascular consolidation along the left lower lobe. No pulmonary mass. No pleural effusion. No pneumothorax.  Upper Abdomen: 2 mm calcified stone partially visualized within the left kidney. Musculoskeletal: No chest wall abnormality. No suspicious lytic or blastic osseous lesions. No acute displaced fracture. Degenerative changes of the right shoulder. Multilevel degenerative changes of the spine. Chronic densely sclerotic lesions of the T6 and T9 vertebral bodies. Similar finding within the manubrium. Findings suggestive of bone islands. Review of the MIP images confirms the above findings. IMPRESSION: 1. No pulmonary embolus. 2. Multifocal pneumonia (atypical). Recommend attention on follow-up in 3 months to evaluate for complete resolution. 3. Aortic Atherosclerosis (ICD10-I70.0) including four-vessel coronary calcification. 4. Partially visualized 2 mm left nephrolithiasis. Electronically Signed   By: Tish Frederickson M.D.   On: 10/15/2022 19:40   DG Chest 2 View  Result Date: 10/15/2022 CLINICAL DATA:  Sepsis EXAM: CHEST - 2 VIEW COMPARISON:  X-ray 04/28/2017 FINDINGS: Hyperinflation. No pneumothorax, effusion or edema. Normal cardiopericardial silhouette with calcified aorta. Subtle left retrocardiac opacity. Degenerative changes of the spine and shoulders. Overlapping cardiac leads. IMPRESSION: Hyperinflation. Subtle opacity left retrocardiac. Infiltrates not excluded. Recommend follow-up Electronically Signed   By: Karen Kays M.D.   On: 10/15/2022 13:41    Pending Labs Unresulted Labs (From admission, onward)     Start     Ordered   10/22/22 0500  Creatinine, serum  (enoxaparin (LOVENOX)    CrCl < 30 ml/min)  Once,   R       Comments: while on enoxaparin therapy.    10/15/22 1535   10/16/22 0500  Comprehensive metabolic panel  Tomorrow morning,   R        10/15/22 1535   10/16/22 0500  CBC  Tomorrow morning,   R        10/15/22 1535   10/15/22 1125  Culture, blood (Routine x 2)  BLOOD CULTURE X 2,   R (with STAT occurrences)      10/15/22 1125  Vitals/Pain Today's Vitals    10/15/22 1715 10/15/22 1900 10/15/22 2015 10/15/22 2037  BP: (!) 102/55 (!) 97/56 129/73 129/85  Pulse: 92 83 92 87  Resp:    20  Temp:    98.5 F (36.9 C)  TempSrc:    Oral  SpO2: 99% 99% 99% 100%  Weight:      Height:      PainSc:        Isolation Precautions Airborne and Contact precautions  Medications Medications  cefTRIAXone (ROCEPHIN) 1 g in sodium chloride 0.9 % 100 mL IVPB (0 g Intravenous Stopped 10/15/22 1536)  levothyroxine (SYNTHROID) tablet 50 mcg (has no administration in time range)  tamsulosin (FLOMAX) capsule 0.4 mg (0.4 mg Oral Given 10/15/22 2111)  enoxaparin (LOVENOX) injection 30 mg (30 mg Subcutaneous Given 10/15/22 2110)  sodium chloride flush (NS) 0.9 % injection 3 mL (3 mLs Intravenous Given 10/15/22 1711)  acetaminophen (TYLENOL) tablet 650 mg (has no administration in time range)    Or  acetaminophen (TYLENOL) suppository 650 mg (has no administration in time range)  polyethylene glycol (MIRALAX / GLYCOLAX) packet 17 g (has no administration in time range)  0.9 %  sodium chloride infusion ( Intravenous New Bag/Given 10/15/22 1704)  dexamethasone (DECADRON) injection 6 mg (6 mg Intravenous Given 10/15/22 1704)  nirmatrelvir/ritonavir (renal dosing) (PAXLOVID) 2 tablet (has no administration in time range)  sodium chloride 0.9 % bolus 500 mL (500 mLs Intravenous Bolus 10/15/22 1239)  sodium chloride 0.9 % bolus 500 mL (500 mLs Intravenous Bolus 10/15/22 1406)  azithromycin (ZITHROMAX) 500 mg in sodium chloride 0.9 % 250 mL IVPB (0 mg Intravenous Stopped 10/15/22 1827)  iohexol (OMNIPAQUE) 350 MG/ML injection 75 mL (75 mLs Intravenous Contrast Given 10/15/22 1740)    Mobility walks     Focused Assessments Cardiac Assessment Handoff:  Cardiac Rhythm: Normal sinus rhythm Lab Results  Component Value Date   TROPONINI <0.03 04/28/2017   No results found for: "DDIMER" Does the Patient currently have chest pain? No   , Neuro Assessment Handoff:  Swallow screen  pass? Yes  Cardiac Rhythm: Normal sinus rhythm       Neuro Assessment:   Neuro Checks:      Has TPA been given? No If patient is a Neuro Trauma and patient is going to OR before floor call report to 4N Charge nurse: [phone] or [phone]  , Pulmonary Assessment Handoff:  Lung sounds:   O2 Device: Nasal Cannula O2 Flow Rate (L/min): 2 L/min    R Recommendations: See Admitting Provider Note  Report given to:   Additional Notes:

## 2022-10-15 NOTE — H&P (Addendum)
History and Physical   Aous Model [id number] DOB: 11/25/1931 DOA: 10/15/2022  PCP: Kristian Covey, MD   Patient coming from: Home  Chief Complaint: Fever, cough, shortness of breath, COVID  HPI: Timothy Garrett is a 89 y.o. male with medical history significant of hypertension, hyperlipidemia, GERD, hypothyroidism, CKD 3, BPH, diverticulosis, IBS, lactose intolerance, lumbar stenosis presenting with worsening symptoms from COVID infection.  Some history obtained with assistance of chart review and family due to patient's significant hard of hearing.  Patient's had some ongoing cough, fever, shortness of breath.  Was seen outpatient and diagnosed with COVID-19 2 days ago.  Has had some worsening symptoms since, especially cough.  Denies chills, chest pain, abdominal pain, constipation, diarrhea, nausea, vomiting  ED Course: Vital signs in the ED notable for blood pressure in the 80s to 120s systolic, respiratory rate in the 20s, requiring 2 L to maintain saturations.  Lab workup included CMP with bicarb 19, BUN 27, creatinine elevated 1.51 which is just mildly above baseline, glucose 124, calcium 8.4, protein 6.0, albumin 3.3.  CBC with hemoglobin stable 11.3, platelets 137.  PT and INR normal.  Lactic acid normal with repeat pending.  Urinalysis and blood cultures pending.  Chest x-ray with hyperinflation and subtle left retrocardiac opacity.  CTA PE study ordered and is pending.  Patient received ceftriaxone and azithromycin in the ED as well as a liter of IV fluids.  Has had some improvement in hypotension but still has low normal blood pressure.  Review of Systems: As per HPI otherwise all other systems reviewed and are negative.  Past Medical History:  Diagnosis Date   Arthritis    Diverticulitis    Diverticulitis    GERD (gastroesophageal reflux disease)    Heartburn   Hyperlipemia    Hypertension    IBS (irritable bowel syndrome)    Kidney stones     Past  Surgical History:  Procedure Laterality Date   BACK SURGERY     EYE SURGERY  2016   cataract surgery   TONSILLECTOMY  1941   ulcer surgery      Social History  reports that he has quit smoking. He has never used smokeless tobacco. He reports that he does not drink alcohol and does not use drugs.  Allergies  Allergen Reactions   Sulfamethoxazole-Trimethoprim Itching    Family History  Problem Relation Age of Onset   Stomach cancer Mother    Hypertension Mother    Heart disease Father    Heart attack Father    Heart attack Sister    Stroke Maternal Grandfather    Heart attack Paternal Grandfather   Reviewed on admission  Prior to Admission medications   Medication Sig Start Date End Date Taking? Authorizing Provider  acetaminophen (TYLENOL) 500 MG tablet Take 1,000 mg by mouth every 6 (six) hours as needed (for pain).    [provider]  acetaminophen-codeine (TYLENOL #3) 300-30 MG tablet Take 1-2 tablets by mouth every 6 (six) hours as needed for moderate pain. 03/06/21   Burchette, Elberta Fortis, MD  albuterol (VENTOLIN HFA) 108 (90 Base) MCG/ACT inhaler Inhale 2 puffs into the lungs every 6 (six) hours as needed for wheezing or shortness of breath. 12/31/21   Karie Georges, MD  atorvastatin (LIPITOR) 20 MG tablet TAKE 1/2 TABLET BY MOUTH AT BEDTIME 10/02/22   Burchette, Elberta Fortis, MD  Cholecalciferol (VITAMIN D) 50 MCG (2000 UT) CAPS Take 1 capsule by mouth daily.    [provider]  ciprofloxacin-dexamethasone (CIPRODEX) OTIC suspension SMARTSIG:4 Drop(s) Left Ear Twice Daily 09/13/22   [provider]  gabapentin (NEURONTIN) 100 MG capsule TAKE 2 CAPSULES BY MOUTH EVERY MORNING AND 3 CAPSULES AT BEDTIME 06/05/22   Burchette, Elberta Fortis, MD  levothyroxine (SYNTHROID) 50 MCG tablet TAKE 1 TABLET(50 MCG) BY MOUTH DAILY 10/02/22   Burchette, Elberta Fortis, MD  lisinopril (ZESTRIL) 20 MG tablet TAKE 1 TABLET(20 MG) BY MOUTH AT BEDTIME 10/02/22   Burchette, Elberta Fortis, MD   LORazepam (ATIVAN) 1 MG tablet TAKE 1/2 TO 1 TABLET(0.5 TO 1 MG) BY MOUTH AT BEDTIME 04/14/22   Burchette, Elberta Fortis, MD  methylPREDNISolone (MEDROL DOSEPAK) 4 MG TBPK tablet Take package as directed 12/31/21   Karie Georges, MD  Multiple Vitamins-Minerals (CENTRUM SILVER 50+MEN) TABS Take 1 tablet by mouth daily with breakfast.    [provider]  pantoprazole (PROTONIX) 40 MG tablet TAKE 1 TABLET BY MOUTH EVERY DAY 07/05/22   Burchette, Elberta Fortis, MD  tamsulosin (FLOMAX) 0.4 MG CAPS capsule TAKE 1 CAPSULE BY MOUTH AT BEDTIME 10/02/22   Burchette, Elberta Fortis, MD  traMADol (ULTRAM) 50 MG tablet TAKE 1 TABLET(50 MG) BY MOUTH EVERY 6 HOURS FOR UP TO 5 DAYS AS NEEDED 02/22/21   Kristian Covey, MD    Physical Exam: Vitals:   10/15/22 1345 10/15/22 1400 10/15/22 1515 10/15/22 1530  BP: (!) 74/50 (!) 90/51 (!) 120/55 101/74  Pulse: 82 82 84 91  Resp: 19 19    Temp:      TempSrc:      SpO2: 98% 98% 98% 97%  Weight:      Height:        Physical Exam Constitutional:      General: He is not in acute distress.    Appearance: Normal appearance.  HENT:     Head: Normocephalic and atraumatic.     Mouth/Throat:     Mouth: Mucous membranes are moist.     Pharynx: Oropharynx is clear.  Eyes:     Extraocular Movements: Extraocular movements intact.     Pupils: Pupils are equal, round, and reactive to light.  Cardiovascular:     Rate and Rhythm: Normal rate and regular rhythm.     Pulses: Normal pulses.     Heart sounds: Normal heart sounds.  Pulmonary:     Effort: Pulmonary effort is normal. No respiratory distress.     Breath sounds: Rhonchi and rales present.  Abdominal:     General: Bowel sounds are normal. There is no distension.     Palpations: Abdomen is soft.     Tenderness: There is no abdominal tenderness.  Musculoskeletal:        General: No swelling or deformity.  Skin:    General: Skin is warm and dry.  Neurological:     General: No focal deficit present.      Mental Status: Mental status is at baseline.    Labs on Admission: I have personally reviewed following labs and imaging studies  CBC: Recent Labs  Lab 10/15/22 1135  WBC 7.2  NEUTROABS 5.4  HGB 11.5*  HCT 35.1*  MCV 102.9*  PLT 137*    Basic Metabolic Panel: Recent Labs  Lab 10/15/22 1135  NA 137  K 3.7  CL 105  CO2 19*  GLUCOSE 124*  BUN 27*  CREATININE 1.51*  CALCIUM 8.4*    GFR: Estimated Creatinine Clearance: 29.2 mL/min (A) (by C-G formula based on SCr of 1.51 mg/dL (H)).  Liver Function Tests: Recent Labs  Lab 10/15/22 1135  AST 28  ALT 20  ALKPHOS 38  BILITOT 0.7  PROT 6.0*  ALBUMIN 3.3*    Urine analysis:    Component Value Date/Time   COLORURINE YELLOW 10/15/2022 1455   APPEARANCEUR HAZY (A) 10/15/2022 1455   LABSPEC 1.009 10/15/2022 1455   PHURINE 5.0 10/15/2022 1455   GLUCOSEU NEGATIVE 10/15/2022 1455   HGBUR SMALL (A) 10/15/2022 1455   HGBUR negative 08/19/2008 0759   BILIRUBINUR NEGATIVE 10/15/2022 1455   KETONESUR 5 (A) 10/15/2022 1455   PROTEINUR NEGATIVE 10/15/2022 1455   UROBILINOGEN 0.2 12/04/2008 1612   NITRITE NEGATIVE 10/15/2022 1455   LEUKOCYTESUR NEGATIVE 10/15/2022 1455    Radiological Exams on Admission: DG Chest 2 View  Result Date: 10/15/2022 CLINICAL DATA:  Sepsis EXAM: CHEST - 2 VIEW COMPARISON:  X-ray 04/28/2017 FINDINGS: Hyperinflation. No pneumothorax, effusion or edema. Normal cardiopericardial silhouette with calcified aorta. Subtle left retrocardiac opacity. Degenerative changes of the spine and shoulders. Overlapping cardiac leads. IMPRESSION: Hyperinflation. Subtle opacity left retrocardiac. Infiltrates not excluded. Recommend follow-up Electronically Signed   By: Karen Kays M.D.   On: 10/15/2022 13:41    EKG: Independently reviewed.  Sinus rhythm at 94 bpm.  Low voltage multiple leads.  Baseline wander.  Nonspecific T wave flattening.  Assessment/Plan Principal Problem:   Acute respiratory failure with  hypoxia (HCC) Active Problems:   Hypothyroidism   Hyperlipidemia   GERD   BPH (benign prostatic hyperplasia)   CKD (chronic kidney disease) stage 3, GFR 30-59 ml/min (HCC)   Hypertension   Acute respiratory failure with hypoxia COVID-19 infection > Patient presenting with known COVID-19 infection for the past 2 days.  Found to be hypoxic on room air requiring 2 L to maintain saturations. > Possible retrocardiac opacity noted on chest x-ray.  CTA PE study pending in the ED. > No leukocytosis so presumably this is COVID-pneumonia.  Has been covered with ceftriaxone/azithromycin in the meantime in the ED. - Will monitor on progressive unit given low blood pressure which is slowly responding to IV fluids - Hold off on further antibiotics - Continue with supplemental oxygen, wean as tolerated - Procalcitonin - Follow-up CTA PE study - Daily Decadron - Paxlovid, pharmacy eval for interactions - Pulmonary toilet - Supportive care  Hypotension > Initially some hypotensive measurements in the ED with systolics in the 80s and maps in the mid 60s.  This is improved status post 1 L of IV fluids to systolics in the 90s to 100s. - Monitor on progressive unit - Will continue with IV fluids and hold antihypertensives  Hypertension - Holding home lisinopril in the setting of low normal blood pressure in the ED  Hyperlipidemia - Continue atorvastatin  Hypothyroidism - Continue home Synthroid   CKD 3 > Creatinine near baseline at 1.5 in ED - Trend renal function and electrolytes  BPH - Continue home tamsulosin  DVT prophylaxis: Lovenox Code Status:   Full Family Communication:  Updated at bedside Disposition Plan:   Patient is from:  Home  Anticipated DC to:  Home  Anticipated DC date:  1 to 3 days  Anticipated DC barriers: None  Consults called:  None Admission status:  Observation, progressive  Severity of Illness: The appropriate patient status for this patient is  OBSERVATION. Observation status is judged to be reasonable and necessary in order to provide the required intensity of service to ensure the patient's safety. The patient's presenting symptoms, physical exam findings, and initial radiographic and  laboratory data in the context of their medical condition is felt to place them at decreased risk for further clinical deterioration. Furthermore, it is anticipated that the patient will be medically stable for discharge from the hospital within 2 midnights of admission.    Synetta Fail MD [facility]  How to contact the [facility] Attending or Consulting provider 7A - 7P or covering provider during after hours 7P -7A, for this patient?   Check the care team in [facility] and look for a) attending/consulting TR[facility] provider listed and b) the [facility] team listed Log into [email or web address] and use [place] universal password to access. If you do not have the password, please contact the hospital operator. Locate the [facility] provider you are looking for under [facility] and page to a number that you can be directly reached. If you still have difficulty reaching the provider, please page the [facility] (Director on Call) for the Hospitalists listed on amion for assistance.  10/15/2022, 3:39 PM

## 2022-10-15 NOTE — ED Triage Notes (Signed)
Pt present to ED from doctor's office with c/o shortness of breath, weakness, cough, fever, poor appetite. Pt recently dx with covid two days ago. Pt noted with temp of 102 at doctor's office, was given ibuprofen. Pt also received NS prior ED arrival. Pt A&Ox3 at times, HOH to right ear.   EMS VS: 90/60 98 95% 2L O2 CBG 138 99.7F(oral)

## 2022-10-16 DIAGNOSIS — J189 Pneumonia, unspecified organism: Secondary | ICD-10-CM

## 2022-10-16 DIAGNOSIS — Z79899 Other long term (current) drug therapy: Secondary | ICD-10-CM | POA: Diagnosis not present

## 2022-10-16 DIAGNOSIS — J9601 Acute respiratory failure with hypoxia: Secondary | ICD-10-CM | POA: Diagnosis present

## 2022-10-16 DIAGNOSIS — N183 Chronic kidney disease, stage 3 unspecified: Secondary | ICD-10-CM | POA: Diagnosis not present

## 2022-10-16 DIAGNOSIS — Z800 Family history of malignant neoplasm of digestive organs: Secondary | ICD-10-CM | POA: Diagnosis not present

## 2022-10-16 DIAGNOSIS — J1282 Pneumonia due to coronavirus disease 2019: Secondary | ICD-10-CM | POA: Diagnosis present

## 2022-10-16 DIAGNOSIS — E785 Hyperlipidemia, unspecified: Secondary | ICD-10-CM | POA: Diagnosis present

## 2022-10-16 DIAGNOSIS — Z87891 Personal history of nicotine dependence: Secondary | ICD-10-CM | POA: Diagnosis not present

## 2022-10-16 DIAGNOSIS — E039 Hypothyroidism, unspecified: Secondary | ICD-10-CM | POA: Diagnosis present

## 2022-10-16 DIAGNOSIS — J159 Unspecified bacterial pneumonia: Secondary | ICD-10-CM | POA: Diagnosis present

## 2022-10-16 DIAGNOSIS — R0902 Hypoxemia: Secondary | ICD-10-CM | POA: Diagnosis present

## 2022-10-16 DIAGNOSIS — N400 Enlarged prostate without lower urinary tract symptoms: Secondary | ICD-10-CM | POA: Diagnosis present

## 2022-10-16 DIAGNOSIS — H9190 Unspecified hearing loss, unspecified ear: Secondary | ICD-10-CM | POA: Diagnosis present

## 2022-10-16 DIAGNOSIS — I10 Essential (primary) hypertension: Secondary | ICD-10-CM | POA: Diagnosis present

## 2022-10-16 DIAGNOSIS — U071 COVID-19: Secondary | ICD-10-CM | POA: Diagnosis present

## 2022-10-16 DIAGNOSIS — Z823 Family history of stroke: Secondary | ICD-10-CM | POA: Diagnosis not present

## 2022-10-16 DIAGNOSIS — I959 Hypotension, unspecified: Secondary | ICD-10-CM | POA: Diagnosis present

## 2022-10-16 DIAGNOSIS — Z882 Allergy status to sulfonamides status: Secondary | ICD-10-CM | POA: Diagnosis not present

## 2022-10-16 DIAGNOSIS — Z8249 Family history of ischemic heart disease and other diseases of the circulatory system: Secondary | ICD-10-CM | POA: Diagnosis not present

## 2022-10-16 DIAGNOSIS — Z79890 Hormone replacement therapy (postmenopausal): Secondary | ICD-10-CM | POA: Diagnosis not present

## 2022-10-16 DIAGNOSIS — K219 Gastro-esophageal reflux disease without esophagitis: Secondary | ICD-10-CM | POA: Diagnosis present

## 2022-10-16 LAB — COMPREHENSIVE METABOLIC PANEL
ALT: 23 U/L (ref 0–44)
AST: 30 U/L (ref 15–41)
Albumin: 2.6 g/dL — ABNORMAL LOW (ref 3.5–5.0)
Alkaline Phosphatase: 38 U/L (ref 38–126)
Anion gap: 11 (ref 5–15)
BUN: 19 mg/dL (ref 8–23)
CO2: 19 mmol/L — ABNORMAL LOW (ref 22–32)
Calcium: 8.1 mg/dL — ABNORMAL LOW (ref 8.9–10.3)
Chloride: 109 mmol/L (ref 98–111)
Creatinine, Ser: 1.06 mg/dL (ref 0.61–1.24)
GFR, Estimated: >60 mL/min (ref >60)
Glucose, Bld: 119 mg/dL — ABNORMAL HIGH (ref 70–99)
Potassium: 3.8 mmol/L (ref 3.5–5.1)
Sodium: 139 mmol/L (ref 135–145)
Total Bilirubin: 0.8 mg/dL (ref 0.3–1.2)
Total Protein: 5.3 g/dL — ABNORMAL LOW (ref 6.5–8.1)

## 2022-10-16 LAB — CBC
HCT: 34.7 % — ABNORMAL LOW (ref 39.0–52.0)
Hemoglobin: 11.3 g/dL — ABNORMAL LOW (ref 13.0–17.0)
MCH: 33.4 pg (ref 26.0–34.0)
MCHC: 32.6 g/dL (ref 30.0–36.0)
MCV: 102.7 fL — ABNORMAL HIGH (ref 80.0–100.0)
Platelets: 143 10*3/uL — ABNORMAL LOW (ref 150–400)
RBC: 3.38 MIL/uL — ABNORMAL LOW (ref 4.22–5.81)
RDW: 14.2 % (ref 11.5–15.5)
WBC: 7.5 10*3/uL (ref 4.0–10.5)
nRBC: 0 % (ref 0.0–0.2)

## 2022-10-16 LAB — C-REACTIVE PROTEIN: CRP: 22.5 mg/dL — ABNORMAL HIGH (ref <1.0)

## 2022-10-16 LAB — PROCALCITONIN: Procalcitonin: 6.11 ng/mL

## 2022-10-16 MED ORDER — SODIUM CHLORIDE 0.9 % IV SOLN
100.0000 mg | Freq: Every day | INTRAVENOUS | Status: DC
  Administered 2022-10-17: 100 mg via INTRAVENOUS
  Filled 2022-10-16: qty 20

## 2022-10-16 MED ORDER — AZITHROMYCIN 500 MG PO TABS
250.0000 mg | ORAL_TABLET | Freq: Every day | ORAL | Status: AC
  Administered 2022-10-16 – 2022-10-17 (×2): 250 mg via ORAL
  Filled 2022-10-16 (×2): qty 1

## 2022-10-16 MED ORDER — IPRATROPIUM-ALBUTEROL 0.5-2.5 (3) MG/3ML IN SOLN: 3.0000 mL | Freq: Three times a day (TID) | RESPIRATORY_TRACT | Status: DC

## 2022-10-16 MED ORDER — SODIUM CHLORIDE 0.9 % IV SOLN
200.0000 mg | Freq: Once | INTRAVENOUS | Status: AC
  Administered 2022-10-16: 200 mg via INTRAVENOUS
  Filled 2022-10-16: qty 40

## 2022-10-16 MED ORDER — ENOXAPARIN SODIUM 40 MG/0.4ML IJ SOSY
40.0000 mg | PREFILLED_SYRINGE | INTRAMUSCULAR | Status: DC
  Administered 2022-10-16: 40 mg via SUBCUTANEOUS
  Filled 2022-10-16: qty 0.4

## 2022-10-16 MED ORDER — GUAIFENESIN ER 600 MG PO TB12
600.0000 mg | ORAL_TABLET | Freq: Two times a day (BID) | ORAL | Status: DC
  Administered 2022-10-16 – 2022-10-17 (×3): 600 mg via ORAL
  Filled 2022-10-16 (×4): qty 1

## 2022-10-16 MED ORDER — METHYLPREDNISOLONE SODIUM SUCC 125 MG IJ SOLR
60.0000 mg | INTRAMUSCULAR | Status: DC
  Administered 2022-10-16 – 2022-10-17 (×2): 60 mg via INTRAVENOUS
  Filled 2022-10-16 (×2): qty 2

## 2022-10-16 MED ORDER — IPRATROPIUM-ALBUTEROL 0.5-2.5 (3) MG/3ML IN SOLN
3.0000 mL | Freq: Three times a day (TID) | RESPIRATORY_TRACT | Status: DC
  Administered 2022-10-16 – 2022-10-17 (×3): 3 mL via RESPIRATORY_TRACT
  Filled 2022-10-16 (×3): qty 3

## 2022-10-16 MED ORDER — SODIUM CHLORIDE 0.9 % IV SOLN
2.0000 g | INTRAVENOUS | Status: DC
  Administered 2022-10-16 – 2022-10-17 (×2): 2 g via INTRAVENOUS
  Filled 2022-10-16 (×2): qty 20

## 2022-10-16 NOTE — Plan of Care (Signed)
Pt admitted from ED with SOB, cough, +covid.  Pt alert & oriented x 4. Vital signs stable. Afebrile. Oriented to room and call light. Meds given as ordered (see MAR). Also see Flowsheet for detailed assessment and frequent vitals.  Pt resting quietly in bed, call light in reach. Will continue with plan of care.                                                                       Problem: Education: Goal: Knowledge of risk factors and measures for prevention of condition will improve Outcome: Progressing   Problem: Coping: Goal: Psychosocial and spiritual needs will be supported Outcome: Progressing   Problem: Respiratory: Goal: Will maintain a patent airway Outcome: Progressing Goal: Complications related to the disease process, condition or treatment will be avoided or minimized Outcome: Progressing   Problem: Education: Goal: Knowledge of General Education information will improve Description: Including pain rating scale, medication(s)/side effects and non-pharmacologic comfort measures Outcome: Progressing   Problem: Health Behavior/Discharge Planning: Goal: Ability to manage health-related needs will improve Outcome: Progressing   Problem: Clinical Measurements: Goal: Ability to maintain clinical measurements within normal limits will improve Outcome: Progressing Goal: Will remain free from infection Outcome: Progressing Goal: Diagnostic test results will improve Outcome: Progressing Goal: Respiratory complications will improve Outcome: Progressing Goal: Cardiovascular complication will be avoided Outcome: Progressing   Problem: Activity: Goal: Risk for activity intolerance will decrease Outcome: Progressing   Problem: Nutrition: Goal: Adequate nutrition will be maintained Outcome: Progressing     Problem: Coping: Goal: Level of anxiety will decrease Outcome: Progressing   Problem: Elimination: Goal: Will not experience complications related to bowel  motility Outcome: Progressing Goal: Will not experience complications related to urinary retention Outcome: Progressing   Problem: Pain Managment: Goal: General experience of comfort will improve Outcome: Progressing   Problem: Safety: Goal: Ability to remain free from injury will improve Outcome: Progressing   Problem: Skin Integrity: Goal: Risk for impaired skin integrity will decrease Outcome: Progressing

## 2022-10-16 NOTE — Progress Notes (Signed)
PROGRESS NOTE        PATIENT DETAILS Name: Timothy Garrett Age: 89 y.o. Sex: male Date of Birth: [birth year] Admit Date: 10/15/2022 Admitting Physician Synetta Fail, MD UJW:[id number], Elberta Fortis, MD  Brief Summary: Patient is a 89 y.o.  male with history of HTN, HLD, GERD, hypothyroidism-who presented with 3-4-day history of fever, cough, shortness of breath-he was diagnosed with COVID-19 infection at [facility] urgent care on 9/3.  Patient was found to be mildly hypoxic requiring 2-3 L of oxygen to maintain O2 saturations, had multifocal pneumonia on imaging studies.  Patient was then admitted to [facility] service for further evaluation and treatment.  Significant events: 9/3>> diagnosed with COVID at [facility] urgent care-mildly hypoxic-multifocal infiltrates on CT chest-admit to [facility].  Significant studies: 9/3>> CTA chest: No PE, multifocal PNA.  Significant microbiology data: 9/3>> blood culture: No growth  Procedures: None  Consults: None  Subjective: Feels-on 2 L of oxygen this morning.  Coughing multiple times while I was in the room.  Objective: Vitals: Blood pressure 131/66, pulse 97, temperature 98.3 F (36.8 C), temperature source Oral, resp. rate 16, height 5\' 6"  (1.676 m), weight 61 kg, SpO2 97%.   Exam: Gen Exam:Alert awake-not in any distress HEENT:atraumatic, normocephalic Chest: Bibasilar rales-some transmitted upper airway sounds. CVS:S1S2 regular Abdomen:soft non tender, non distended Extremities:no edema Neurology: Non focal Skin: no rash  Pertinent Labs/Radiology:    Latest Ref Rng & Units 10/16/2022    6:59 AM 10/15/2022   11:35 AM 03/02/2021   10:38 AM  CBC  WBC 4.0 - 10.5 K/uL 7.5  7.2  14.9   Hemoglobin 13.0 - 17.0 g/dL 56.2  13.0  86.5   Hematocrit 39.0 - 52.0 % 34.7  35.1  39.0   Platelets 150 - 400 K/uL 143  137  272     Lab Results  Component Value Date   NA 139 10/16/2022   K 3.8 10/16/2022   CL 109  10/16/2022   CO2 19 (L) 10/16/2022      Assessment/Plan: Acute hypoxic respiratory failure in the setting of multifocal pneumonia-likely COVID-19 infection with superimposed bacterial pneumonia Stable on 2-3 L of oxygen this morning CRP/procalcitonin significantly elevated Change Decadron to Solu-Medrol Stop Paxlovid-switch to IV Remdesivir Rocephin x 5 days, Zithromax x 3 days Encourage pulmonary toileting-have asked RN to teach patient how to use incentive spirometry/flutter valve Out of bed to chair-mobilize with PT/OT and nursing staff Attempt to titrate off oxygen.  HTN BP soft but stable Continue to hold all antihypertensives  HLD Resume statin when closer to discharge  BPH Flomax  Hypothyroidism Synthroid  BMI: Estimated body mass index is 21.71 kg/m as calculated from the following:   Height as of this encounter: 5\' 6"  (1.676 m).   Weight as of this encounter: 61 kg.   Code status:   Code Status: Full Code   DVT Prophylaxis: enoxaparin (LOVENOX) injection 40 mg Start: 10/16/22 2000   Family Communication: None at bedside   Disposition Plan: Status is: Observation The patient will require care spanning > 2 midnights and should be moved to inpatient because: Severity of illness   Planned Discharge Destination:Home health   Diet: Diet Order             Diet regular Room service appropriate? Yes; Fluid consistency: Thin  Diet effective now  Antimicrobial agents: Anti-infectives (From admission, onward)    Start     Dose/Rate Route Frequency Ordered Stop   10/17/22 1000  remdesivir 100 mg in sodium chloride 0.9 % 100 mL IVPB       Placed in "Followed by" Linked Group   100 mg 200 mL/hr over 30 Minutes Intravenous Daily 10/16/22 0842 10/19/22 0959   10/16/22 1400  cefTRIAXone (ROCEPHIN) 2 g in sodium chloride 0.9 % 100 mL IVPB        2 g 200 mL/hr over 30 Minutes Intravenous Every 24 hours 10/16/22 0842 10/20/22 1359    10/16/22 1000  remdesivir 200 mg in sodium chloride 0.9% 250 mL IVPB       Placed in "Followed by" Linked Group   200 mg 580 mL/hr over 30 Minutes Intravenous Once 10/16/22 0842     10/16/22 1000  azithromycin (ZITHROMAX) tablet 250 mg        250 mg Oral Daily 10/16/22 0842 10/18/22 0959   10/15/22 2200  nirmatrelvir/ritonavir (renal dosing) (PAXLOVID) 2 tablet  Status:  Discontinued        2 tablet Oral 2 times daily 10/15/22 1743 10/16/22 0833   10/15/22 1430  cefTRIAXone (ROCEPHIN) 1 g in sodium chloride 0.9 % 100 mL IVPB  Status:  Discontinued        1 g 200 mL/hr over 30 Minutes Intravenous Every 24 hours 10/15/22 1425 10/16/22 0842   10/15/22 1430  azithromycin (ZITHROMAX) 500 mg in sodium chloride 0.9 % 250 mL IVPB        500 mg 250 mL/hr over 60 Minutes Intravenous  Once 10/15/22 1425 10/15/22 1827        MEDICATIONS: Scheduled Meds:  azithromycin  250 mg Oral Daily   enoxaparin (LOVENOX) injection  40 mg Subcutaneous Q24H   levothyroxine  50 mcg Oral Q0600   methylPREDNISolone (SOLU-MEDROL) injection  60 mg Intravenous Q24H   sodium chloride flush  3 mL Intravenous Q12H   tamsulosin  0.4 mg Oral QHS   Continuous Infusions:  cefTRIAXone (ROCEPHIN)  IV     remdesivir 200 mg in sodium chloride 0.9% 250 mL IVPB     Followed by   Melene Muller ON 10/17/2022] remdesivir 100 mg in sodium chloride 0.9 % 100 mL IVPB     PRN Meds:.acetaminophen **OR** acetaminophen, polyethylene glycol   I have personally reviewed following labs and imaging studies  LABORATORY DATA: CBC: Recent Labs  Lab 10/15/22 1135 10/16/22 0659  WBC 7.2 7.5  NEUTROABS 5.4  --   HGB 11.5* 11.3*  HCT 35.1* 34.7*  MCV 102.9* 102.7*  PLT 137* 143*    Basic Metabolic Panel: Recent Labs  Lab 10/15/22 1135 10/16/22 0659  NA 137 139  K 3.7 3.8  CL 105 109  CO2 19* 19*  GLUCOSE 124* 119*  BUN 27* 19  CREATININE 1.51* 1.06  CALCIUM 8.4* 8.1*    GFR: Estimated Creatinine Clearance: 41.6 mL/min (by  C-G formula based on SCr of 1.06 mg/dL).  Liver Function Tests: Recent Labs  Lab 10/15/22 1135 10/16/22 0659  AST 28 30  ALT 20 23  ALKPHOS 38 38  BILITOT 0.7 0.8  PROT 6.0* 5.3*  ALBUMIN 3.3* 2.6*   No results for input(s): "LIPASE", "AMYLASE" in the last 168 hours. No results for input(s): "AMMONIA" in the last 168 hours.  Coagulation Profile: Recent Labs  Lab 10/15/22 1135  INR 1.0    Cardiac Enzymes: No results for input(s): "CKTOTAL", "CKMB", "CKMBINDEX", "TROPONINI" in the  last 168 hours.  BNP (last 3 results) No results for input(s): "PROBNP" in the last 8760 hours.  Lipid Profile: No results for input(s): "CHOL", "HDL", "LDLCALC", "TRIG", "CHOLHDL", "LDLDIRECT" in the last 72 hours.  Thyroid Function Tests: No results for input(s): "TSH", "T4TOTAL", "FREET4", "T3FREE", "THYROIDAB" in the last 72 hours.  Anemia Panel: No results for input(s): "VITAMINB12", "FOLATE", "FERRITIN", "TIBC", "IRON", "RETICCTPCT" in the last 72 hours.  Urine analysis:    Component Value Date/Time   COLORURINE YELLOW 10/15/2022 1455   APPEARANCEUR HAZY (A) 10/15/2022 1455   LABSPEC 1.009 10/15/2022 1455   PHURINE 5.0 10/15/2022 1455   GLUCOSEU NEGATIVE 10/15/2022 1455   HGBUR SMALL (A) 10/15/2022 1455   HGBUR negative 08/19/2008 0759   BILIRUBINUR NEGATIVE 10/15/2022 1455   KETONESUR 5 (A) 10/15/2022 1455   PROTEINUR NEGATIVE 10/15/2022 1455   UROBILINOGEN 0.2 12/04/2008 1612   NITRITE NEGATIVE 10/15/2022 1455   LEUKOCYTESUR NEGATIVE 10/15/2022 1455    Sepsis Labs: Lactic Acid, Venous    Component Value Date/Time   LATICACIDVEN 1.1 10/15/2022 1205    MICROBIOLOGY: Recent Results (from the past 240 hour(s))  Culture, blood (Routine x 2)     Status: None (Preliminary result)   Collection Time: 10/15/22 11:25 AM   Specimen: BLOOD RIGHT FOREARM  Result Value Ref Range Status   Specimen Description BLOOD RIGHT FOREARM  Final   Special Requests   Final    BOTTLES DRAWN  AEROBIC AND ANAEROBIC Blood Culture adequate volume   Culture   Final    NO GROWTH < 24 HOURS Performed at [facility], [address], [place], [place] [address]    Report Status PENDING  Incomplete  Culture, blood (Routine x 2)     Status: None (Preliminary result)   Collection Time: 10/15/22 11:45 AM   Specimen: BLOOD RIGHT ARM  Result Value Ref Range Status   Specimen Description BLOOD RIGHT ARM  Final   Special Requests   Final    BOTTLES DRAWN AEROBIC AND ANAEROBIC Blood Culture results may not be optimal due to an inadequate volume of blood received in culture bottles   Culture   Final    NO GROWTH < 24 HOURS Performed at [facility], [address], [place], [place] [address]    Report Status PENDING  Incomplete    RADIOLOGY STUDIES/RESULTS: CT Angio Chest PE W/Cm &/Or Wo Cm  Result Date: 10/15/2022 CLINICAL DATA:  Pulmonary embolism (PE) suspected, low to intermediate prob, positive D-dimer. COVID. Sepsis EXAM: CT ANGIOGRAPHY CHEST WITH CONTRAST TECHNIQUE: Multidetector CT imaging of the chest was performed using the standard protocol during bolus administration of intravenous contrast. Multiplanar CT image reconstructions and MIPs were obtained to evaluate the vascular anatomy. RADIATION DOSE REDUCTION: This exam was performed according to the departmental dose-optimization program which includes automated exposure control, adjustment of the mA and/or kV according to patient size and/or use of iterative reconstruction technique. CONTRAST:  75mL OMNIPAQUE IOHEXOL 350 MG/ML SOLN COMPARISON:  CT abdomen pelvis 03/02/2021 FINDINGS: Cardiovascular: Satisfactory opacification of the pulmonary arteries to the segmental level. No evidence of pulmonary embolism. The main pulmonary artery is normal in caliber. Normal heart size. No significant pericardial effusion. The thoracic aorta is normal in caliber. Moderate atherosclerotic plaque of the thoracic aorta. Four-vessel coronary  artery calcifications. Mediastinum/Nodes: No enlarged mediastinal, hilar, or axillary lymph nodes. Thyroid gland, trachea, and esophagus demonstrate no significant findings. Lungs/Pleura: Biapical pleural/pulmonary scarring. Interval development of tree-in-bud nodularity within the bilateral lower  lobes. Mild bronchial wall thickening associated with this. Peribronchovascular consolidation along the left lower lobe. No pulmonary mass. No pleural effusion. No pneumothorax. Upper Abdomen: 2 mm calcified stone partially visualized within the left kidney. Musculoskeletal: No chest wall abnormality. No suspicious lytic or blastic osseous lesions. No acute displaced fracture. Degenerative changes of the right shoulder. Multilevel degenerative changes of the spine. Chronic densely sclerotic lesions of the T6 and T9 vertebral bodies. Similar finding within the manubrium. Findings suggestive of bone islands. Review of the MIP images confirms the above findings. IMPRESSION: 1. No pulmonary embolus. 2. Multifocal pneumonia (atypical). Recommend attention on follow-up in 3 months to evaluate for complete resolution. 3. Aortic Atherosclerosis (ICD10-I70.0) including four-vessel coronary calcification. 4. Partially visualized 2 mm left nephrolithiasis. Electronically Signed   By: Tish Frederickson M.D.   On: 10/15/2022 19:40   DG Chest 2 View  Result Date: 10/15/2022 CLINICAL DATA:  Sepsis EXAM: CHEST - 2 VIEW COMPARISON:  X-ray 04/28/2017 FINDINGS: Hyperinflation. No pneumothorax, effusion or edema. Normal cardiopericardial silhouette with calcified aorta. Subtle left retrocardiac opacity. Degenerative changes of the spine and shoulders. Overlapping cardiac leads. IMPRESSION: Hyperinflation. Subtle opacity left retrocardiac. Infiltrates not excluded. Recommend follow-up Electronically Signed   By: Karen Kays M.D.   On: 10/15/2022 13:41     LOS: 0 days   Jeoffrey Massed, MD  [facility]    To contact the  attending provider between 7A-7P or the covering provider during after hours 7P-7A, please log into the web site [email or web address] and access using universal [place] password for that web site. If you do not have the password, please call the hospital operator.  10/16/2022, 9:15 AM

## 2022-10-16 NOTE — Plan of Care (Signed)
  Problem: Education: Goal: Knowledge of risk factors and measures for prevention of condition will improve Outcome: Progressing   

## 2022-10-16 NOTE — Progress Notes (Signed)
Patient provided with incentive spirometer and flutter valve and instructed on how to use them.  Patient and son stated understanding and patient demonstrated proper usage of each.

## 2022-10-17 ENCOUNTER — Other Ambulatory Visit (HOSPITAL_COMMUNITY): Payer: Self-pay

## 2022-10-17 DIAGNOSIS — N183 Chronic kidney disease, stage 3 unspecified: Secondary | ICD-10-CM | POA: Diagnosis not present

## 2022-10-17 DIAGNOSIS — E785 Hyperlipidemia, unspecified: Secondary | ICD-10-CM | POA: Diagnosis not present

## 2022-10-17 DIAGNOSIS — J9601 Acute respiratory failure with hypoxia: Secondary | ICD-10-CM | POA: Diagnosis not present

## 2022-10-17 DIAGNOSIS — N400 Enlarged prostate without lower urinary tract symptoms: Secondary | ICD-10-CM | POA: Diagnosis not present

## 2022-10-17 LAB — BASIC METABOLIC PANEL
Anion gap: 12 (ref 5–15)
BUN: 25 mg/dL — ABNORMAL HIGH (ref 8–23)
CO2: 18 mmol/L — ABNORMAL LOW (ref 22–32)
Calcium: 8.2 mg/dL — ABNORMAL LOW (ref 8.9–10.3)
Chloride: 109 mmol/L (ref 98–111)
Creatinine, Ser: 1.12 mg/dL (ref 0.61–1.24)
GFR, Estimated: >60 mL/min (ref >60)
Glucose, Bld: 156 mg/dL — ABNORMAL HIGH (ref 70–99)
Potassium: 3.9 mmol/L (ref 3.5–5.1)
Sodium: 139 mmol/L (ref 135–145)

## 2022-10-17 LAB — CBC
HCT: 34.1 % — ABNORMAL LOW (ref 39.0–52.0)
Hemoglobin: 11.2 g/dL — ABNORMAL LOW (ref 13.0–17.0)
MCH: 33.3 pg (ref 26.0–34.0)
MCHC: 32.8 g/dL (ref 30.0–36.0)
MCV: 101.5 fL — ABNORMAL HIGH (ref 80.0–100.0)
Platelets: 175 10*3/uL (ref 150–400)
RBC: 3.36 MIL/uL — ABNORMAL LOW (ref 4.22–5.81)
RDW: 14 % (ref 11.5–15.5)
WBC: 9.4 10*3/uL (ref 4.0–10.5)
nRBC: 0 % (ref 0.0–0.2)

## 2022-10-17 LAB — PROCALCITONIN: Procalcitonin: 4.51 ng/mL

## 2022-10-17 LAB — C-REACTIVE PROTEIN: CRP: 13.2 mg/dL — ABNORMAL HIGH (ref <1.0)

## 2022-10-17 MED ORDER — BENZONATATE 100 MG PO CAPS
100.0000 mg | ORAL_CAPSULE | Freq: Three times a day (TID) | ORAL | 0 refills | Status: DC | PRN
Start: 2022-10-17 — End: 2022-11-04
  Filled 2022-10-17: qty 30, 10d supply, fill #0

## 2022-10-17 MED ORDER — GUAIFENESIN ER 600 MG PO TB12
600.0000 mg | ORAL_TABLET | Freq: Two times a day (BID) | ORAL | 0 refills | Status: AC
Start: 2022-10-17 — End: 2022-10-22
  Filled 2022-10-17: qty 10, 5d supply, fill #0

## 2022-10-17 MED ORDER — PREDNISONE 10 MG PO TABS
ORAL_TABLET | ORAL | 0 refills | Status: AC
  Filled 2022-10-17: qty 10, 4d supply, fill #0

## 2022-10-17 MED ORDER — ALBUTEROL SULFATE HFA 108 (90 BASE) MCG/ACT IN AERS
2.0000 | INHALATION_SPRAY | RESPIRATORY_TRACT | 0 refills | Status: AC | PRN
Start: 2022-10-17 — End: ?
  Filled 2022-10-17: qty 6.7, 17d supply, fill #0

## 2022-10-17 MED ORDER — AMOXICILLIN-POT CLAVULANATE 875-125 MG PO TABS
1.0000 | ORAL_TABLET | Freq: Two times a day (BID) | ORAL | 0 refills | Status: AC
  Filled 2022-10-17: qty 4, 2d supply, fill #0

## 2022-10-17 NOTE — Plan of Care (Signed)

## 2022-10-17 NOTE — Evaluation (Signed)
Occupational Therapy Evaluation Patient Details Name: Timothy Garrett MRN: [id number] DOB: [birth year] Today's Date: 10/17/2022   History of Present Illness 89 y.o. male presenting 10/15/22 with worsening symptoms from COVID infection. Hypotension; multifocal pna;   PMH significant of hypertension, hyperlipidemia, GERD, hypothyroidism, CKD 3, BPH, diverticulosis, IBS, lactose intolerance, lumbar stenosis   Clinical Impression   Pt admitted for above, PTA pt reports being independent in mobility with [facility] and ADLs. Pt currently ambulating and completing ADLs with Supervision, he did need a lil more time and UE support to help steady himself. Pt Sp02 remained stable on RA, not asymptomatic even though BP dropped in standing, denies SOB with light activity. Pt also denies SOB or exertion with activities that required bending at the waist. Pt has no further acute skilled OT services at this time. NO follow up OT recommended.        If plan is discharge home, recommend the following: Other (comment) (n/a)    Functional Status Assessment  Patient has not had a recent decline in their functional status  Equipment Recommendations  None recommended by OT (Pt has rec DME)    Recommendations for Other Services       Precautions / Restrictions Precautions Precautions: Fall Precaution Comments: Covid Restrictions Weight Bearing Restrictions: No      Mobility Bed Mobility               General bed mobility comments: Pt rec'd sitting EOB, left in recliner. Not fully assessed    Transfers Overall transfer level: Needs assistance Equipment used: None Transfers: Sit to/from Stand Sit to Stand: Contact guard assist           General transfer comment: Pt seemed slightly unsteady without UE support during STS, used rails to assist with steadying balance. Given RW once pt gathered his balance. Ambulated and chair transfer close supervision      Balance Overall balance  assessment: Needs assistance Sitting-balance support: Feet supported Sitting balance-Leahy Scale: Good     Standing balance support: During functional activity Standing balance-Leahy Scale: Fair                             ADL either performed or assessed with clinical judgement   ADL Overall ADL's : Needs assistance/impaired Eating/Feeding: Independent;Sitting   Grooming: Supervision/safety;Standing   Upper Body Bathing: Modified independent;Sitting   Lower Body Bathing: Supervison/ safety;Set up;Sitting/lateral leans   Upper Body Dressing : Modified independent;Sitting   Lower Body Dressing: Supervision/safety;Set up Lower Body Dressing Details (indicate cue type and reason): simulated Toilet Transfer: Supervision/safety;Ambulation;Rolling walker (2 wheels) Toilet Transfer Details (indicate cue type and reason): simulated with recliner Toileting- Clothing Manipulation and Hygiene: Supervision/safety;Sit to/from stand               Vision         Perception         Praxis         Pertinent Vitals/Pain Pain Assessment Pain Assessment: No/denies pain     Extremity/Trunk Assessment Upper Extremity Assessment Upper Extremity Assessment: Overall WFL for tasks assessed   Lower Extremity Assessment Lower Extremity Assessment: Defer to PT evaluation       Communication Communication Communication: Hearing impairment (Very HOH, hears better out of L ear) Cueing Techniques: Verbal cues;Tactile cues   Cognition Arousal: Alert Behavior During Therapy: WFL for tasks assessed/performed Overall Cognitive Status: Within Functional Limits for tasks assessed  General Comments  HR in 120s with mobility, BP 121/75 in sitting but 103/71 in standing. Sp02 stable on RA    Exercises     Shoulder Instructions      Home Living Family/patient expects to be discharged to:: Private residence Living  Arrangements: Spouse/significant other Available Help at Discharge: Family;Available 24 hours/day Type of Home: House Home Access: Stairs to enter [employer] of Steps: 2 Entrance Stairs-Rails: Left Home Layout: One level     Bathroom Shower/Tub: Producer, television/film/video: Handicapped height Bathroom Accessibility: Yes How Accessible: Accessible via walker Home Equipment: Rolling Walker (2 wheels);Cane - single point;Grab bars - tub/shower;Shower seat - built in   Additional Comments: 1 fall PTA, tripped      Prior Functioning/Environment Prior Level of Function : Independent/Modified Independent;History of Falls (last six months)             Mobility Comments: ambulates with SPC ADLs Comments: ind        OT Problem List: Impaired balance (sitting and/or standing)      OT Treatment/Interventions:      OT Goals(Current goals can be found in the care plan section) Acute Rehab OT Goals Patient Stated Goal: To go home OT Goal Formulation: With patient Time For Goal Achievement: 10/31/22 Potential to Achieve Goals: Good  OT Frequency:      Co-evaluation              AM-PAC OT "6 Clicks" Daily Activity     Outcome Measure Help from another person eating meals?: None Help from another person taking care of personal grooming?: A Little Help from another person toileting, which includes using toliet, bedpan, or urinal?: A Little Help from another person bathing (including washing, rinsing, drying)?: A Little Help from another person to put on and taking off regular upper body clothing?: None Help from another person to put on and taking off regular lower body clothing?: A Little 6 Click Score: 20   End of Session Equipment Utilized During Treatment: Gait belt;Rolling walker (2 wheels) Nurse Communication: Mobility status  Activity Tolerance: Patient tolerated treatment well Patient left: in chair;with call bell/phone within reach;with chair  alarm set  OT Visit Diagnosis: Unsteadiness on feet (R26.81)                Time: 8756-4332 OT Time Calculation (min): 22 min Charges:  OT General Charges $OT Visit: 1 Visit OT Evaluation $OT Eval Moderate Complexity: 1 Mod  10/17/2022  AB, OTR/L  Acute Rehabilitation Services  Office: [phone]   Tristan Schroeder 10/17/2022, 10:10 AM

## 2022-10-17 NOTE — TOC Transition Note (Signed)
Transition of Care [facility]) - CM/SW Discharge Note   Patient Details  Name: Timothy Garrett MRN: [id number] Date of Birth: 1931/03/26  Transition of Care [facility]) CM/SW Contact:  Gordy Clement, RN Phone Number: 10/17/2022, 12:08 PM   Clinical Narrative:     Patient will DC to home today  Son will transport.  There are no recommendations for follow up therapies or DME.   No TOC needs identified           Patient Goals and CMS Choice      Discharge Placement                         Discharge Plan and Services Additional resources added to the After Visit Summary for                                       Social Determinants of Health (SDOH) Interventions SDOH Screenings   Food Insecurity: No Food Insecurity (10/16/2022)  Housing: Low Risk  (07/24/2022)  Transportation Needs: No Transportation Needs (10/16/2022)  Utilities: Not At Risk (10/16/2022)  Alcohol Screen: Low Risk  (07/24/2022)  Depression (PHQ2-9): Low Risk  (07/24/2022)  Financial Resource Strain: Low Risk  (07/24/2022)  Physical Activity: Inactive (07/24/2022)  Social Connections: Moderately Isolated (07/24/2022)  Stress: No Stress Concern Present (07/24/2022)  Tobacco Use: Medium Risk (10/15/2022)     Readmission Risk Interventions     No data to display

## 2022-10-17 NOTE — Evaluation (Signed)
Physical Therapy Brief Evaluation and Discharge Note Patient Details Name: Timothy Garrett MRN: [id number] DOB: 10/04/1931 Today's Date: 10/17/2022   History of Present Illness  89 y.o. male presenting 10/15/22 with worsening symptoms from COVID infection. Hypotension; multifocal pna;   PMH significant of hypertension, hyperlipidemia, GERD, hypothyroidism, CKD 3, BPH, diverticulosis, IBS, lactose intolerance, lumbar stenosis  Clinical Impression   Patient evaluated by Physical Therapy with no further acute PT needs identified. All education has been completed and the patient has no further questions. PT is signing off. Thank you for this referral.        PT Assessment Patient does not need any further PT services  Assistance Needed at Discharge  None    Equipment Recommendations None recommended by PT  Recommendations for Other Services       Precautions/Restrictions Precautions Precautions: Fall Precaution Comments: Covid Restrictions Weight Bearing Restrictions: No        Mobility  Bed Mobility       General bed mobility comments: up in recliner  Transfers Overall transfer level: Needs assistance Equipment used: Straight cane Transfers: Sit to/from Stand Sit to Stand: Modified independent (Device/Increase time)           General transfer comment: proper use of cane; no unsteadiness noted    Ambulation/Gait Ambulation/Gait assistance: Modified independent (Device/Increase time) Gait Distance (Feet): 120 Feet Assistive device: Rolling walker (2 wheels), Straight cane Gait Pattern/deviations: Step-through pattern, Decreased stride length, Trunk flexed   General Gait Details: initially tried cane with pt seeking UE support with opposite hand; switched to RW and pt stated he felt more secure  Home Activity Instructions    Stairs            Modified Rankin (Stroke Patients Only)        Balance   Sitting-balance support: Feet  supported Sitting balance-Leahy Scale: Good     Standing balance support: During functional activity Standing balance-Leahy Scale: Fair            Pertinent Vitals/Pain PT - Brief Vital Signs All Vital Signs Stable: Yes (on RA, sats >90% throughout) Pain Assessment Pain Assessment: No/denies pain     Home Living Family/patient expects to be discharged to:: Private residence Living Arrangements: Spouse/significant other Available Help at Discharge: Available 24 hours/day Home Environment: Stairs to enter;Rail - left  Stairs-Number of Steps: 2 Home Equipment: Agricultural consultant (2 wheels);Cane - single point;Grab bars - tub/shower;Shower seat - built in   Additional Comments: 1 fall PTA, tripped    Prior Function Level of Independence: Independent with assistive device(s) Comments: uses cane mostly, but sometimes RW if walking longer distance    UE/LE Assessment   UE ROM/Strength/Tone/Coordination: WFL    LE ROM/Strength/Tone/Coordination: [facility]      Communication   Communication Communication: Hearing impairment (Very HOH, hears better out of L ear) Cueing Techniques: Verbal cues     Cognition Overall Cognitive Status: Appears within functional limits for tasks assessed/performed       General Comments General comments (skin integrity, edema, etc.): HR 120 with sats >90% on RA. Denied dizziness or SOB    Exercises Other Exercises Other Exercises: Pt demonstrated proper use of IS and flutter valve   Assessment/Plan    PT Problem List         PT Visit Diagnosis Other abnormalities of gait and mobility (R26.89)    No Skilled PT All education completed;Patient at baseline level of functioning;Patient is modified independent with all activity/mobility   Co-evaluation  AMPAC 6 Clicks Help needed turning from your back to your side while in a flat bed without using bedrails?: None Help needed moving from lying on your back to sitting on  the side of a flat bed without using bedrails?: None Help needed moving to and from a bed to a chair (including a wheelchair)?: None Help needed standing up from a chair using your arms (e.g., wheelchair or bedside chair)?: None Help needed to walk in hospital room?: None Help needed climbing 3-5 steps with a railing? : None 6 Click Score: 24      End of Session   Activity Tolerance: Patient tolerated treatment well Patient left: in chair;with call bell/phone within reach;with chair alarm set Nurse Communication: Mobility status PT Visit Diagnosis: Other abnormalities of gait and mobility (R26.89)     Time: 4010-2725 PT Time Calculation (min) (ACUTE ONLY): 16 min  Charges:   PT Evaluation $PT Eval Low Complexity: 1 Low       Jerolyn Center, PT Acute Rehabilitation Services  Office [phone]   Zena Amos  10/17/2022, 10:47 AM

## 2022-10-17 NOTE — Discharge Summary (Signed)
PATIENT DETAILS Name: Timothy Garrett Age: 89 y.o. Sex: male Date of Birth: Sep 01, 1931 MRN: [id number]. Admitting Physician: Maretta Bees, MD ONG:[id number], Elberta Fortis, MD  Admit Date: 10/15/2022 Discharge date: 10/17/2022  Recommendations for Outpatient Follow-up:  Follow up with PCP in 1-2 weeks Please obtain CMP/CBC in one week  Admitted From:  Home  Disposition: Home   Discharge Condition: good  CODE STATUS:   Code Status: Full Code   Diet recommendation:  Diet Order             Diet - low sodium heart healthy           Diet regular Room service appropriate? Yes; Fluid consistency: Thin  Diet effective now                    Brief Summary: Patient is a 89 y.o.  male with history of HTN, HLD, GERD, hypothyroidism-who presented with 3-4-day history of fever, cough, shortness of breath-he was diagnosed with COVID-19 infection at [facility] urgent care on 9/3.  Patient was found to be mildly hypoxic requiring 2-3 L of oxygen to maintain O2 saturations, had multifocal pneumonia on imaging studies.  Patient was then admitted to [facility] service for further evaluation and treatment.   Significant events: 9/3>> diagnosed with COVID at [facility] urgent care-mildly hypoxic-multifocal infiltrates on CT chest-admit to [facility].   Significant studies: 9/3>> CTA chest: No PE, multifocal PNA.   Significant microbiology data: 9/3>> blood culture: No growth   Procedures: None   Consults: None  Brief Hospital Course: Acute hypoxic respiratory failure in the setting of multifocal pneumonia-likely COVID-19 infection with superimposed bacterial pneumonia Rapidly improved after starting steroids/IV antibiotics/Remdesivir-now on room air-both CRP/procalcitonin are downtrending.  Symptomatically much better Suspect he can be discharged home-will transition to oral steroids for few days-and Augmentin on discharge. Follow-up with PCP-and obtain repeat two-view chest x-ray in 4  to 6 weeks to document resolution of pneumonia.  HTN BP initially-but increasing Resume oral antihypertensives on discharge    HLD Resume statin   BPH Flomax   Hypothyroidism Synthroid   BMI: Estimated body mass index is 21.71 kg/m as calculated from the following:   Height as of this encounter: 5\' 6"  (1.676 m).   Weight as of this encounter: 61 kg.    Discharge Diagnoses:  Principal Problem:   Acute respiratory failure with hypoxia (HCC) Active Problems:   Hypothyroidism   Hyperlipidemia   GERD   BPH (benign prostatic hyperplasia)   CKD (chronic kidney disease) stage 3, GFR 30-59 ml/min (HCC)   Hypertension   Pneumonia due to COVID-19 virus   Discharge Instructions:  Activity:  As tolerated   Discharge Instructions     Call MD for:  difficulty breathing, headache or visual disturbances   Complete by: As directed    Diet - low sodium heart healthy   Complete by: As directed    Discharge instructions   Complete by: As directed    Follow with Primary MD  Kristian Covey, MD in 1-2 weeks  Please get a complete blood count and chemistry panel checked by your Primary MD at your next visit, and again as instructed by your Primary MD.  Get Medicines reviewed and adjusted: Please take all your medications with you for your next visit with your Primary MD  Laboratory/radiological data: Please request your Primary MD to go over all hospital tests and procedure/radiological results at the follow up, please ask your Primary MD to get  all Hospital records sent to his/her office.  In some cases, they will be blood work, cultures and biopsy results pending at the time of your discharge. Please request that your primary care M.D. follows up on these results.  Also Note the following: If you experience worsening of your admission symptoms, develop shortness of breath, life threatening emergency, suicidal or homicidal thoughts you must seek medical attention immediately  by calling 911 or calling your MD immediately  if symptoms less severe.  You must read complete instructions/literature along with all the possible adverse reactions/side effects for all the Medicines you take and that have been prescribed to you. Take any new Medicines after you have completely understood and accpet all the possible adverse reactions/side effects.   Do not drive when taking Pain medications or sleeping medications (Benzodaizepines)  Do not take more than prescribed Pain, Sleep and Anxiety Medications. It is not advisable to combine anxiety,sleep and pain medications without talking with your primary care practitioner  Special Instructions: If you have smoked or chewed Tobacco  in the last 2 yrs please stop smoking, stop any regular Alcohol  and or any Recreational drug use.  Wear Seat belts while driving.  Please note: You were cared for by a hospitalist during your hospital stay. Once you are discharged, your primary care physician will handle any further medical issues. Please note that NO REFILLS for any discharge medications will be authorized once you are discharged, as it is imperative that you return to your primary care physician (or establish a relationship with a primary care physician if you do not have one) for your post hospital discharge needs so that they can reassess your need for medications and monitor your lab values.   Increase activity slowly   Complete by: As directed       Allergies as of 10/17/2022       Reactions   Bactrim [sulfamethoxazole-trimethoprim] Itching        Medication List     TAKE these medications    acetaminophen 500 MG tablet Commonly known as: TYLENOL Take 1,000 mg by mouth every 6 (six) hours as needed for moderate pain, fever or headache.   albuterol 108 (90 Base) MCG/ACT inhaler Commonly known as: VENTOLIN HFA Inhale 2 puffs into the lungs every 4 (four) hours as needed for wheezing or shortness of breath. What  changed: when to take this   amoxicillin-clavulanate 875-125 MG tablet Commonly known as: AUGMENTIN Take 1 tablet by mouth 2 (two) times daily for 2 days. Start taking on: October 18, 2022   atorvastatin 20 MG tablet Commonly known as: LIPITOR TAKE 1/2 TABLET BY MOUTH AT BEDTIME   benzonatate 100 MG capsule Commonly known as: Tessalon Perles Take 1 capsule (100 mg total) by mouth 3 (three) times daily as needed for cough.   Centrum Silver 50+Men Tabs Take 1 tablet by mouth daily with breakfast.   gabapentin 100 MG capsule Commonly known as: NEURONTIN TAKE 2 CAPSULES BY MOUTH EVERY MORNING AND 3 CAPSULES AT BEDTIME   guaiFENesin 600 MG 12 hr tablet Commonly known as: MUCINEX Take 1 tablet (600 mg total) by mouth 2 (two) times daily for 5 days.   levothyroxine 50 MCG tablet Commonly known as: SYNTHROID TAKE 1 TABLET(50 MCG) BY MOUTH DAILY   lisinopril 20 MG tablet Commonly known as: ZESTRIL TAKE 1 TABLET(20 MG) BY MOUTH AT BEDTIME   LORazepam 1 MG tablet Commonly known as: ATIVAN TAKE 1/2 TO 1 TABLET(0.5 TO 1 MG) BY  MOUTH AT BEDTIME What changed:  how much to take how to take this when to take this reasons to take this additional instructions   predniSONE 10 MG tablet Commonly known as: DELTASONE Take 40 mg daily for 1 day, 30 mg daily for 1 day, 20 mg daily for 1 days,10 mg daily for 1 day, then stop Start taking on: October 18, 2022   tamsulosin 0.4 MG Caps capsule Commonly known as: FLOMAX TAKE 1 CAPSULE BY MOUTH AT BEDTIME   VITAMIN D-3 PO Take 1 capsule by mouth daily.        Follow-up Information     Burchette, Elberta Fortis, MD. Schedule an appointment as soon as possible for a visit in 1 week(s).   Specialty: Family Medicine Contact information: [address] [place]ena Flake [place] [address] [phone]                Allergies  Allergen Reactions   Bactrim [Sulfamethoxazole-Trimethoprim] Itching     Other Procedures/Studies: CT  Angio Chest PE W/Cm &/Or Wo Cm  Result Date: 10/15/2022 CLINICAL DATA:  Pulmonary embolism (PE) suspected, low to intermediate prob, positive D-dimer. COVID. Sepsis EXAM: CT ANGIOGRAPHY CHEST WITH CONTRAST TECHNIQUE: Multidetector CT imaging of the chest was performed using the standard protocol during bolus administration of intravenous contrast. Multiplanar CT image reconstructions and MIPs were obtained to evaluate the vascular anatomy. RADIATION DOSE REDUCTION: This exam was performed according to the departmental dose-optimization program which includes automated exposure control, adjustment of the mA and/or kV according to patient size and/or use of iterative reconstruction technique. CONTRAST:  75mL OMNIPAQUE IOHEXOL 350 MG/ML SOLN COMPARISON:  CT abdomen pelvis 03/02/2021 FINDINGS: Cardiovascular: Satisfactory opacification of the pulmonary arteries to the segmental level. No evidence of pulmonary embolism. The main pulmonary artery is normal in caliber. Normal heart size. No significant pericardial effusion. The thoracic aorta is normal in caliber. Moderate atherosclerotic plaque of the thoracic aorta. Four-vessel coronary artery calcifications. Mediastinum/Nodes: No enlarged mediastinal, hilar, or axillary lymph nodes. Thyroid gland, trachea, and esophagus demonstrate no significant findings. Lungs/Pleura: Biapical pleural/pulmonary scarring. Interval development of tree-in-bud nodularity within the bilateral lower lobes. Mild bronchial wall thickening associated with this. Peribronchovascular consolidation along the left lower lobe. No pulmonary mass. No pleural effusion. No pneumothorax. Upper Abdomen: 2 mm calcified stone partially visualized within the left kidney. Musculoskeletal: No chest wall abnormality. No suspicious lytic or blastic osseous lesions. No acute displaced fracture. Degenerative changes of the right shoulder. Multilevel degenerative changes of the spine. Chronic densely sclerotic  lesions of the T6 and T9 vertebral bodies. Similar finding within the manubrium. Findings suggestive of bone islands. Review of the MIP images confirms the above findings. IMPRESSION: 1. No pulmonary embolus. 2. Multifocal pneumonia (atypical). Recommend attention on follow-up in 3 months to evaluate for complete resolution. 3. Aortic Atherosclerosis (ICD10-I70.0) including four-vessel coronary calcification. 4. Partially visualized 2 mm left nephrolithiasis. Electronically Signed   By: Tish Frederickson M.D.   On: 10/15/2022 19:40   DG Chest 2 View  Result Date: 10/15/2022 CLINICAL DATA:  Sepsis EXAM: CHEST - 2 VIEW COMPARISON:  X-ray 04/28/2017 FINDINGS: Hyperinflation. No pneumothorax, effusion or edema. Normal cardiopericardial silhouette with calcified aorta. Subtle left retrocardiac opacity. Degenerative changes of the spine and shoulders. Overlapping cardiac leads. IMPRESSION: Hyperinflation. Subtle opacity left retrocardiac. Infiltrates not excluded. Recommend follow-up Electronically Signed   By: Karen Kays M.D.   On: 10/15/2022 13:41     TODAY-DAY OF DISCHARGE:  Subjective:   Timothy Garrett today has no  headache,no chest abdominal pain,no new weakness tingling or numbness, feels much better wants to go home today.   Objective:   Blood pressure (!) 141/79, pulse 90, temperature (!) 97.5 F (36.4 C), resp. rate 19, height 5\' 6"  (1.676 m), weight 61 kg, SpO2 95%.  Intake/Output Summary (Last 24 hours) at 10/17/2022 0951 Last data filed at 10/16/2022 2300 Gross per 24 hour  Intake 340 ml  Output 1000 ml  Net -660 ml   Filed Weights   10/15/22 1122  Weight: 61 kg    Exam: Awake Alert, Oriented *3, No new F.N deficits, Normal affect [place].AT,PERRAL Supple Neck,No JVD, No cervical lymphadenopathy appriciated.  Symmetrical Chest wall movement, Good air movement bilaterally, CTAB RRR,No Gallops,Rubs or new Murmurs, No Parasternal Heave +ve B.Sounds, Abd Soft, Non tender, No organomegaly  appriciated, No rebound -guarding or rigidity. No Cyanosis, Clubbing or edema, No new Rash or bruise   PERTINENT RADIOLOGIC STUDIES: CT Angio Chest PE W/Cm &/Or Wo Cm  Result Date: 10/15/2022 CLINICAL DATA:  Pulmonary embolism (PE) suspected, low to intermediate prob, positive D-dimer. COVID. Sepsis EXAM: CT ANGIOGRAPHY CHEST WITH CONTRAST TECHNIQUE: Multidetector CT imaging of the chest was performed using the standard protocol during bolus administration of intravenous contrast. Multiplanar CT image reconstructions and MIPs were obtained to evaluate the vascular anatomy. RADIATION DOSE REDUCTION: This exam was performed according to the departmental dose-optimization program which includes automated exposure control, adjustment of the mA and/or kV according to patient size and/or use of iterative reconstruction technique. CONTRAST:  75mL OMNIPAQUE IOHEXOL 350 MG/ML SOLN COMPARISON:  CT abdomen pelvis 03/02/2021 FINDINGS: Cardiovascular: Satisfactory opacification of the pulmonary arteries to the segmental level. No evidence of pulmonary embolism. The main pulmonary artery is normal in caliber. Normal heart size. No significant pericardial effusion. The thoracic aorta is normal in caliber. Moderate atherosclerotic plaque of the thoracic aorta. Four-vessel coronary artery calcifications. Mediastinum/Nodes: No enlarged mediastinal, hilar, or axillary lymph nodes. Thyroid gland, trachea, and esophagus demonstrate no significant findings. Lungs/Pleura: Biapical pleural/pulmonary scarring. Interval development of tree-in-bud nodularity within the bilateral lower lobes. Mild bronchial wall thickening associated with this. Peribronchovascular consolidation along the left lower lobe. No pulmonary mass. No pleural effusion. No pneumothorax. Upper Abdomen: 2 mm calcified stone partially visualized within the left kidney. Musculoskeletal: No chest wall abnormality. No suspicious lytic or blastic osseous lesions. No  acute displaced fracture. Degenerative changes of the right shoulder. Multilevel degenerative changes of the spine. Chronic densely sclerotic lesions of the T6 and T9 vertebral bodies. Similar finding within the manubrium. Findings suggestive of bone islands. Review of the MIP images confirms the above findings. IMPRESSION: 1. No pulmonary embolus. 2. Multifocal pneumonia (atypical). Recommend attention on follow-up in 3 months to evaluate for complete resolution. 3. Aortic Atherosclerosis (ICD10-I70.0) including four-vessel coronary calcification. 4. Partially visualized 2 mm left nephrolithiasis. Electronically Signed   By: Tish Frederickson M.D.   On: 10/15/2022 19:40   DG Chest 2 View  Result Date: 10/15/2022 CLINICAL DATA:  Sepsis EXAM: CHEST - 2 VIEW COMPARISON:  X-ray 04/28/2017 FINDINGS: Hyperinflation. No pneumothorax, effusion or edema. Normal cardiopericardial silhouette with calcified aorta. Subtle left retrocardiac opacity. Degenerative changes of the spine and shoulders. Overlapping cardiac leads. IMPRESSION: Hyperinflation. Subtle opacity left retrocardiac. Infiltrates not excluded. Recommend follow-up Electronically Signed   By: Karen Kays M.D.   On: 10/15/2022 13:41     PERTINENT LAB RESULTS: CBC: Recent Labs    10/16/22 0659 10/17/22 0436  WBC 7.5 9.4  HGB 11.3* 11.2*  HCT  34.7* 34.1*  PLT 143* 175   CMET CMP     Component Value Date/Time   NA 139 10/17/2022 0436   K 3.9 10/17/2022 0436   CL 109 10/17/2022 0436   CO2 18 (L) 10/17/2022 0436   GLUCOSE 156 (H) 10/17/2022 0436   BUN 25 (H) 10/17/2022 0436   CREATININE 1.12 10/17/2022 0436   CALCIUM 8.2 (L) 10/17/2022 0436   PROT 5.3 (L) 10/16/2022 0659   ALBUMIN 2.6 (L) 10/16/2022 0659   AST 30 10/16/2022 0659   ALT 23 10/16/2022 0659   ALKPHOS 38 10/16/2022 0659   BILITOT 0.8 10/16/2022 0659   GFR 50.56 (L) 10/25/2020 1120   GFRNONAA >60 10/17/2022 0436    GFR Estimated Creatinine Clearance: 39.3 mL/min (by C-G  formula based on SCr of 1.12 mg/dL). No results for input(s): "LIPASE", "AMYLASE" in the last 72 hours. No results for input(s): "CKTOTAL", "CKMB", "CKMBINDEX", "TROPONINI" in the last 72 hours. Invalid input(s): "POCBNP" No results for input(s): "DDIMER" in the last 72 hours. No results for input(s): "HGBA1C" in the last 72 hours. No results for input(s): "CHOL", "HDL", "LDLCALC", "TRIG", "CHOLHDL", "LDLDIRECT" in the last 72 hours. No results for input(s): "TSH", "T4TOTAL", "T3FREE", "THYROIDAB" in the last 72 hours.  Invalid input(s): "FREET3" No results for input(s): "VITAMINB12", "FOLATE", "FERRITIN", "TIBC", "IRON", "RETICCTPCT" in the last 72 hours. Coags: Recent Labs    10/15/22 1135  INR 1.0   Microbiology: Recent Results (from the past 240 hour(s))  Culture, blood (Routine x 2)     Status: None (Preliminary result)   Collection Time: 10/15/22 11:25 AM   Specimen: BLOOD RIGHT FOREARM  Result Value Ref Range Status   Specimen Description BLOOD RIGHT FOREARM  Final   Special Requests   Final    BOTTLES DRAWN AEROBIC AND ANAEROBIC Blood Culture adequate volume   Culture   Final    NO GROWTH 2 DAYS Performed at [facility], [address] [place], [place] [address]    Report Status PENDING  Incomplete  Culture, blood (Routine x 2)     Status: None (Preliminary result)   Collection Time: 10/15/22 11:45 AM   Specimen: BLOOD RIGHT ARM  Result Value Ref Range Status   Specimen Description BLOOD RIGHT ARM  Final   Special Requests   Final    BOTTLES DRAWN AEROBIC AND ANAEROBIC Blood Culture results may not be optimal due to an inadequate volume of blood received in culture bottles   Culture   Final    NO GROWTH 2 DAYS Performed at [facility], [address] [place], [place] [address]    Report Status PENDING  Incomplete    FURTHER DISCHARGE INSTRUCTIONS:  Get Medicines reviewed and adjusted: Please take all your medications with you for your next visit  with your Primary MD  Laboratory/radiological data: Please request your Primary MD to go over all hospital tests and procedure/radiological results at the follow up, please ask your Primary MD to get all Hospital records sent to his/her office.  In some cases, they will be blood work, cultures and biopsy results pending at the time of your discharge. Please request that your primary care M.D. goes through all the records of your hospital data and follows up on these results.  Also Note the following: If you experience worsening of your admission symptoms, develop shortness of breath, life threatening emergency, suicidal or homicidal thoughts you must seek medical attention immediately by calling 911 or calling your MD  immediately  if symptoms less severe.  You must read complete instructions/literature along with all the possible adverse reactions/side effects for all the Medicines you take and that have been prescribed to you. Take any new Medicines after you have completely understood and accpet all the possible adverse reactions/side effects.   Do not drive when taking Pain medications or sleeping medications (Benzodaizepines)  Do not take more than prescribed Pain, Sleep and Anxiety Medications. It is not advisable to combine anxiety,sleep and pain medications without talking with your primary care practitioner  Special Instructions: If you have smoked or chewed Tobacco  in the last 2 yrs please stop smoking, stop any regular Alcohol  and or any Recreational drug use.  Wear Seat belts while driving.  Please note: You were cared for by a hospitalist during your hospital stay. Once you are discharged, your primary care physician will handle any further medical issues. Please note that NO REFILLS for any discharge medications will be authorized once you are discharged, as it is imperative that you return to your primary care physician (or establish a relationship with a primary care physician  if you do not have one) for your post hospital discharge needs so that they can reassess your need for medications and monitor your lab values.  Total Time spent coordinating discharge including counseling, education and face to face time equals greater than 30 minutes.  SignedJeoffrey Massed 10/17/2022 9:51 AM

## 2022-10-17 NOTE — Progress Notes (Signed)
Delivered TOC meds to the unit. Patient's RN is aware.

## 2022-10-17 NOTE — Progress Notes (Signed)
   10/17/22 1206  TOC Brief Assessment  Insurance and Status Reviewed Herbalist Medicare)  Patient has primary care physician Yes (Available  Burchette, Elberta Fortis, MD)  Home environment has been reviewed From Home with Wife  Prior level of function: Independent  Prior/Current Home Services No current home services  Social Determinants of Health Reivew SDOH reviewed no interventions necessary  Readmission risk has been reviewed Yes (14%)  Transition of care needs no transition of care needs at this time

## 2022-10-18 ENCOUNTER — Telehealth: Payer: Self-pay

## 2022-10-18 NOTE — Transitions of Care (Post Inpatient/ED Visit) (Signed)
   10/18/2022  Name: Timothy Garrett MRN: [id number] DOB: 16-Sep-1931  Today's TOC FU Call Status: Today's TOC FU Call Status:: Unsuccessful Call (1st Attempt) Unsuccessful Call (1st Attempt) Date: 10/18/22  Attempted to reach the patient regarding the most recent Inpatient/ED visit.  Follow Up Plan: Additional outreach attempts will be made to reach the patient to complete the Transitions of Care (Post Inpatient/ED visit) call.     Antionette Fairy, RN,BSN,CCM [facility] Care Management Care Management Community Coordinator Direct Phone: [phone] Toll Free: [phone] Fax: [phone]

## 2022-10-18 NOTE — Transitions of Care (Post Inpatient/ED Visit) (Signed)
10/18/2022  Name: Timothy Garrett MRN: [id number] DOB: 1932/01/27  Today's TOC FU Call Status: Today's TOC FU Call Status:: Successful TOC FU Call Completed TOC FU Call Complete Date: 10/18/22 (Voicemail message received from daughter-Anne-return call placed.) Patient's Name and Date of Birth confirmed.  Transition Care Management Follow-up Telephone Call Date of Discharge: 10/17/22 Discharge Facility: [facility]) Type of Discharge: Inpatient Admission Primary Inpatient Discharge Diagnosis:: "hypoxia" How have you been since you were released from the hospital?: Better (Daughter pleased to report how much better pt is doing-he slept well-taking resp meds-has had no SOB or acute resp sxs-occassional cough) Any questions or concerns?: No  Items Reviewed: Did you receive and understand the discharge instructions provided?: Yes Medications obtained,verified, and reconciled?: Yes (Medications Reviewed) Any new allergies since your discharge?: No Dietary orders reviewed?: Yes Type of Diet Ordered:: low salt/heart healthy Do you have support at home?: Yes People in Home: spouse, child(ren), adult Name of Support/Comfort Primary Source: lives with spouse-adult children assist as needed  Medications Reviewed Today: Medications Reviewed Today     Reviewed by Charlyn Minerva, RN (Registered Nurse) on 10/18/22 at 1249  Med List Status: <None>   Medication Order Taking? Sig Documenting Provider Last Dose Status Informant  acetaminophen (TYLENOL) 500 MG tablet [id number] Yes Take 1,000 mg by mouth every 6 (six) hours as needed for moderate pain, fever or headache. [provider] Taking Active Self, Child, Pharmacy Records  albuterol (VENTOLIN HFA) 108 (90 Base) MCG/ACT inhaler [id number] Yes Inhale 2 puffs into the lungs every 4 (four) hours as needed for wheezing or shortness of breath. Maretta Bees, MD Taking Active   amoxicillin-clavulanate (AUGMENTIN)  875-125 MG tablet [id number] Yes Take 1 tablet by mouth 2 (two) times daily for 2 days. Maretta Bees, MD Taking Active   atorvastatin (LIPITOR) 20 MG tablet [id number] Yes TAKE 1/2 TABLET BY MOUTH AT BEDTIME Burchette, Elberta Fortis, MD Taking Active Self, Child, Pharmacy Records  benzonatate (TESSALON PERLES) 100 MG capsule [id number] Yes Take 1 capsule (100 mg total) by mouth 3 (three) times daily as needed for cough. Maretta Bees, MD Taking Active   Cholecalciferol (VITAMIN D-3 PO) [id number] Yes Take 1 capsule by mouth daily. [provider] Taking Active Self, Child, Pharmacy Records  gabapentin (NEURONTIN) 100 MG capsule [id number] Yes TAKE 2 CAPSULES BY MOUTH EVERY MORNING AND 3 CAPSULES AT BEDTIME Burchette, Elberta Fortis, MD Taking Active Self, Child, Pharmacy Records  guaiFENesin (MUCINEX) 600 MG 12 hr tablet [id number] Yes Take 1 tablet (600 mg total) by mouth 2 (two) times daily for 5 days. Maretta Bees, MD Taking Active   levothyroxine (SYNTHROID) 50 MCG tablet [id number] Yes TAKE 1 TABLET(50 MCG) BY MOUTH DAILY Burchette, Elberta Fortis, MD Taking Active Self, Child, Pharmacy Records  lisinopril (ZESTRIL) 20 MG tablet [id number] Yes TAKE 1 TABLET(20 MG) BY MOUTH AT BEDTIME Kristian Covey, MD Taking Active Self, Child, Pharmacy Records  LORazepam (ATIVAN) 1 MG tablet [id number] Yes TAKE 1/2 TO 1 TABLET(0.5 TO 1 MG) BY MOUTH AT BEDTIME  Patient taking differently: Take 0.5-1 mg by mouth at bedtime as needed for sleep or anxiety.   Kristian Covey, MD Taking Active Self, Child, Pharmacy Records  Multiple Vitamins-Minerals (CENTRUM SILVER 50+MEN) TABS [id number] Yes Take 1 tablet by mouth daily with breakfast. [provider] Taking Active Self, Child, Pharmacy Records  predniSONE (DELTASONE) 10 MG tablet [id number] Yes Take 4 tablets (40 mg total) by mouth daily for 1 day,  THEN 3 tablets (30 mg total) daily for 1 day, THEN 2 tablets (20 mg total) daily for 1 day, THEN 1 tablet (10  mg total) daily for 1 day. Maretta Bees, MD Taking Active   tamsulosin [facility]) 0.4 MG CAPS capsule [id number] Yes TAKE 1 CAPSULE BY MOUTH AT BEDTIME Burchette, Elberta Fortis, MD Taking Active Self, Child, Pharmacy Records            Home Care and Equipment/Supplies: Were Home Health Services Ordered?: NA Any new equipment or medical supplies ordered?: NA  Functional Questionnaire: Do you need assistance with bathing/showering or dressing?: No Do you need assistance with meal preparation?: No Do you need assistance with eating?: No Do you have difficulty maintaining continence: No Do you need assistance with getting out of bed/getting out of a chair/moving?: No Do you have difficulty managing or taking your medications?: No  Follow up appointments reviewed: PCP Follow-up appointment confirmed?: Yes Date of PCP follow-up appointment?: 10/21/22 Follow-up Provider: Dr. Caryl Never Specialist [facility] Follow-up appointment confirmed?: NA Do you need transportation to your follow-up appointment?: No Do you understand care options if your condition(s) worsen?: Yes-patient verbalized understanding  SDOH Interventions Today    Flowsheet Row Most Recent Value  SDOH Interventions   Food Insecurity Interventions Intervention Not Indicated  Transportation Interventions Intervention Not Indicated      TOC Interventions Today    Flowsheet Row Most Recent Value  TOC Interventions   TOC Interventions Discussed/Reviewed TOC Interventions Discussed, S/S of infection      Interventions Today    Flowsheet Row Most Recent Value  General Interventions   General Interventions Discussed/Reviewed General Interventions Discussed, Doctor Visits  Doctor Visits Discussed/Reviewed Doctor Visits Discussed, PCP, Specialist  PCP/Specialist Visits Compliance with follow-up visit  Education Interventions   Education Provided Provided Education  Provided Verbal Education On When to see the doctor,  Medication, Nutrition, Other  Nutrition Interventions   Nutrition Discussed/Reviewed Nutrition Discussed, Fluid intake  Pharmacy Interventions   Pharmacy Dicussed/Reviewed Pharmacy Topics Discussed, Medications and their functions  Safety Interventions   Safety Discussed/Reviewed Safety Discussed       Alessandra Grout [facility] Care Management Care Management Community Coordinator Direct Phone: [phone] Toll Free: [phone] Fax: [phone]

## 2022-10-20 LAB — CULTURE, BLOOD (ROUTINE X 2)
Culture: NO GROWTH
Culture: NO GROWTH
Special Requests: ADEQUATE

## 2022-10-21 ENCOUNTER — Encounter: Payer: Self-pay | Admitting: Family Medicine

## 2022-10-21 ENCOUNTER — Ambulatory Visit: Payer: Medicare Other | Admitting: Family Medicine

## 2022-10-21 VITALS — BP 128/68 | HR 82 | Temp 98.1°F | Ht 66.0 in | Wt 134.1 lb

## 2022-10-21 DIAGNOSIS — I10 Essential (primary) hypertension: Secondary | ICD-10-CM

## 2022-10-21 DIAGNOSIS — E039 Hypothyroidism, unspecified: Secondary | ICD-10-CM

## 2022-10-21 DIAGNOSIS — U071 COVID-19: Secondary | ICD-10-CM

## 2022-10-21 DIAGNOSIS — J1282 Pneumonia due to coronavirus disease 2019: Secondary | ICD-10-CM

## 2022-10-21 NOTE — Patient Instructions (Signed)
Follow up promptly for any fever or increased shortness of breath.  Set up two week follow up to get CXR and follow up labs.

## 2022-10-21 NOTE — Progress Notes (Signed)
Established Patient Office Visit  Subjective   Patient ID: Timothy Garrett, male    DOB: Apr 18, 1931  Age: 89 y.o. MRN: [id number]  Chief Complaint  Patient presents with   Hospitalization Follow-up    HPI   Timothy Garrett is seen today companied by his daughter.  He is very hard of hearing which makes communication difficult.  Much of the history is relayed to his daughter.  He has chronic problems including hypertension, hypothyroidism, GERD, chronic kidney disease, BPH.  Seen for hospital follow-up regarding recent COVID-pneumonia.  He was admitted on the third and discharged on the fifth.  He presented with 3 to 4-day history of fever, cough, shortness of breath and had gone to urgent care on the day of admission with positive COVID test.  Was found to be hypoxic requiring oxygen to maintain sats.  Imaging showed multifocal pneumonia.  CT angiogram showed no pulmonary embolus.  Did confirm multifocal pneumonia.  Blood cultures negative.  Treated with steroids, IV antibiotics, remdesivir.  Weaned off oxygen.  Still has some fatigue but overall improved.  Took 2 more days of Augmentin.  Appetite is improving.  O2 sats have been good on room air.  He is finishing prednisone taper.  No history of diabetes.  He did have some hypotension but is now back on his blood pressure medications and no orthostatic symptoms.  Past Medical History:  Diagnosis Date   Arthritis    Diverticulitis    Diverticulitis    GERD (gastroesophageal reflux disease)    Heartburn   Hyperlipemia    Hypertension    IBS (irritable bowel syndrome)    Kidney stones    Past Surgical History:  Procedure Laterality Date   BACK SURGERY     EYE SURGERY  2016   cataract surgery   TONSILLECTOMY  1941   ulcer surgery      reports that he has quit smoking. He has never used smokeless tobacco. He reports that he does not drink alcohol and does not use drugs. family history includes Heart attack in his father,  paternal grandfather, and sister; Heart disease in his father; Hypertension in his mother; Stomach cancer in his mother; Stroke in his maternal grandfather. Allergies  Allergen Reactions   Bactrim [Sulfamethoxazole-Trimethoprim] Itching    Review of Systems  Constitutional:  Positive for malaise/fatigue. Negative for chills and fever.  Respiratory:  Negative for hemoptysis, shortness of breath and wheezing.   Cardiovascular:  Negative for chest pain.  Gastrointestinal:  Negative for abdominal pain.  Genitourinary:  Negative for dysuria.      Objective:     BP 128/68 (BP Location: Left Arm, Cuff Size: Normal)   Pulse 82   Temp 98.1 F (36.7 C) (Oral)   Ht 5\' 6"  (1.676 m)   Wt 134 lb 1.6 oz (60.8 kg)   SpO2 99%   BMI 21.64 kg/m  BP Readings from Last 3 Encounters:  10/21/22 128/68  10/17/22 116/66  07/24/22 118/60   Wt Readings from Last 3 Encounters:  10/21/22 134 lb 1.6 oz (60.8 kg)  10/15/22 134 lb 7.7 oz (61 kg)  07/24/22 135 lb 1.6 oz (61.3 kg)      Physical Exam Vitals reviewed.  Constitutional:      General: He is not in acute distress.    Appearance: He is not ill-appearing.  Cardiovascular:     Rate and Rhythm: Normal rate and regular rhythm.  Pulmonary:     Effort: Pulmonary effort is normal.  Breath sounds: Normal breath sounds. No wheezing or rales.  Musculoskeletal:     Comments: Trace pitting edema ankles bilaterally  Neurological:     General: No focal deficit present.     Mental Status: He is alert.      No results found for any visits on 10/21/22.  Last CBC Lab Results  Component Value Date   WBC 9.4 10/17/2022   HGB 11.2 (L) 10/17/2022   HCT 34.1 (L) 10/17/2022   MCV 101.5 (H) 10/17/2022   MCH 33.3 10/17/2022   RDW 14.0 10/17/2022   PLT 175 10/17/2022   Last metabolic panel Lab Results  Component Value Date   GLUCOSE 156 (H) 10/17/2022   NA 139 10/17/2022   K 3.9 10/17/2022   CL 109 10/17/2022   CO2 18 (L) 10/17/2022    BUN 25 (H) 10/17/2022   CREATININE 1.12 10/17/2022   GFRNONAA >60 10/17/2022   CALCIUM 8.2 (L) 10/17/2022   PROT 5.3 (L) 10/16/2022   ALBUMIN 2.6 (L) 10/16/2022   BILITOT 0.8 10/16/2022   ALKPHOS 38 10/16/2022   AST 30 10/16/2022   ALT 23 10/16/2022   ANIONGAP 12 10/17/2022   Last lipids Lab Results  Component Value Date   CHOL 155 10/25/2020   HDL 71.80 10/25/2020   LDLCALC 68 10/25/2020   LDLDIRECT 123.0 12/28/2018   TRIG 76.0 10/25/2020   CHOLHDL 2 10/25/2020   Last thyroid functions Lab Results  Component Value Date   TSH 0.97 10/25/2020      The ASCVD Risk score (Arnett DK, et al., 2019) failed to calculate for the following reasons:   The 2019 ASCVD risk score is only valid for ages 34 to 49    Assessment & Plan:   #1 acute respiratory failure with hypoxia secondary to COVID/multifocal pneumonia.  Patient improved following remdesivir and antibiotics.  Finishing up taper of prednisone.  O2 sats today room air 99%.  Lung exam clear.  No respiratory distress.  -Recheck in 2 to 3 weeks and obtain chest x-ray, CBC, and CMP at follow-up -Follow-up immediately for any recurrent fever, increased shortness of breath, or other concerns  #2 hypertension.  Initial blood pressure up but improved with repeat.  Repeat blood pressure stable.  Continue current medication with lisinopril  #3 hypothyroidism.  Overdue for follow-up.  Recheck TSH at follow-up above  #4 health maintenance.  Will plan to address flu vaccine at follow-up in a couple weeks Evelena Peat, MD

## 2022-11-04 ENCOUNTER — Ambulatory Visit: Payer: Medicare Other

## 2022-11-04 ENCOUNTER — Ambulatory Visit: Payer: Medicare Other | Admitting: Family Medicine

## 2022-11-04 ENCOUNTER — Encounter: Payer: Self-pay | Admitting: Family Medicine

## 2022-11-04 VITALS — BP 96/60 | HR 90 | Temp 97.7°F | Ht 66.0 in | Wt 127.3 lb

## 2022-11-04 DIAGNOSIS — Z23 Encounter for immunization: Secondary | ICD-10-CM

## 2022-11-04 DIAGNOSIS — J189 Pneumonia, unspecified organism: Secondary | ICD-10-CM | POA: Diagnosis not present

## 2022-11-04 DIAGNOSIS — E785 Hyperlipidemia, unspecified: Secondary | ICD-10-CM

## 2022-11-04 DIAGNOSIS — I700 Atherosclerosis of aorta: Secondary | ICD-10-CM | POA: Diagnosis not present

## 2022-11-04 DIAGNOSIS — R918 Other nonspecific abnormal finding of lung field: Secondary | ICD-10-CM | POA: Diagnosis not present

## 2022-11-04 DIAGNOSIS — E039 Hypothyroidism, unspecified: Secondary | ICD-10-CM

## 2022-11-04 DIAGNOSIS — R0602 Shortness of breath: Secondary | ICD-10-CM | POA: Diagnosis not present

## 2022-11-04 LAB — CBC WITH DIFFERENTIAL/PLATELET
Basophils Absolute: 0.1 10*3/uL (ref 0.0–0.1)
Basophils Relative: 1.8 % (ref 0.0–3.0)
Eosinophils Absolute: 0.5 10*3/uL (ref 0.0–0.7)
Eosinophils Relative: 7.1 % — ABNORMAL HIGH (ref 0.0–5.0)
HCT: 33.5 % — ABNORMAL LOW (ref 39.0–52.0)
Hemoglobin: 11.3 g/dL — ABNORMAL LOW (ref 13.0–17.0)
Lymphocytes Relative: 17.6 % (ref 12.0–46.0)
Lymphs Abs: 1.1 10*3/uL (ref 0.7–4.0)
MCHC: 33.7 g/dL (ref 30.0–36.0)
MCV: 102 fl — ABNORMAL HIGH (ref 78.0–100.0)
Monocytes Absolute: 0.6 10*3/uL (ref 0.1–1.0)
Monocytes Relative: 9.9 % (ref 3.0–12.0)
Neutro Abs: 4.1 10*3/uL (ref 1.4–7.7)
Neutrophils Relative %: 63.6 % (ref 43.0–77.0)
Platelets: 339 10*3/uL (ref 150.0–400.0)
RBC: 3.28 Mil/uL — ABNORMAL LOW (ref 4.22–5.81)
RDW: 14.8 % (ref 11.5–15.5)
WBC: 6.4 10*3/uL (ref 4.0–10.5)

## 2022-11-04 LAB — COMPREHENSIVE METABOLIC PANEL
ALT: 17 U/L (ref 0–53)
AST: 16 U/L (ref 0–37)
Albumin: 3.8 g/dL (ref 3.5–5.2)
Alkaline Phosphatase: 68 U/L (ref 39–117)
BUN: 16 mg/dL (ref 6–23)
CO2: 25 mEq/L (ref 19–32)
Calcium: 9.3 mg/dL (ref 8.4–10.5)
Chloride: 107 mEq/L (ref 96–112)
Creatinine, Ser: 1.25 mg/dL (ref 0.40–1.50)
GFR: 51.28 mL/min — ABNORMAL LOW (ref >60.00)
Glucose, Bld: 109 mg/dL — ABNORMAL HIGH (ref 70–99)
Potassium: 4.4 mEq/L (ref 3.5–5.1)
Sodium: 140 mEq/L (ref 135–145)
Total Bilirubin: 0.6 mg/dL (ref 0.2–1.2)
Total Protein: 6.3 g/dL (ref 6.0–8.3)

## 2022-11-04 LAB — LIPID PANEL
Cholesterol: 152 mg/dL (ref 0–200)
HDL: 64.5 mg/dL (ref >39.00)
LDL Cholesterol: 49 mg/dL (ref 0–99)
NonHDL: 87.56
Total CHOL/HDL Ratio: 2
Triglycerides: 192 mg/dL — ABNORMAL HIGH (ref 0.0–149.0)
VLDL: 38.4 mg/dL (ref 0.0–40.0)

## 2022-11-04 LAB — TSH: TSH: 0.61 u[IU]/mL (ref 0.35–5.50)

## 2022-11-04 NOTE — Progress Notes (Signed)
Established Patient Office Visit  Subjective   Patient ID: Paolo Dubon, male    DOB: 1931-05-31  Age: 89 y.o. MRN: [id number]  Chief Complaint  Patient presents with   Medical Management of Chronic Issues    HPI   Mr Standing is seen for follow-up from recent hospitalization for COVID and multifocal pneumonia.  Refer to last visit for details.  He states he feels much better overall at this time.  His weight is down about 7 pounds and he thinks a lot of this is fluid weight.  He had some increased peripheral edema his legs after admission and that has resolved.  He is eating well.  He has hypothyroidism and takes levothyroxine 50 mcg daily.  Compliant with therapy.  Needs follow-up TSH.  Recent pneumonia.  He is had no recurrent fever.  No cough.  Needs follow-up CBC and CMP.  We also discussed getting follow-up chest x-ray today.  He has hyperlipidemia and has not had recent lipid panel.  He does take atorvastatin 20 mg daily.  His other regular medications include lisinopril 20 mg daily, tamsulosin 0.4 mg nightly, lorazepam 1 mg 1/2 to 1 tablet nightly, levothyroxine 50 mcg daily, and gabapentin 100 mg 2 capsules in the morning and 3 capsules at night  Past Medical History:  Diagnosis Date   Arthritis    Diverticulitis    Diverticulitis    GERD (gastroesophageal reflux disease)    Heartburn   Hyperlipemia    Hypertension    IBS (irritable bowel syndrome)    Kidney stones    Past Surgical History:  Procedure Laterality Date   BACK SURGERY     EYE SURGERY  2016   cataract surgery   TONSILLECTOMY  1941   ulcer surgery      reports that he has quit smoking. He has never used smokeless tobacco. He reports that he does not drink alcohol and does not use drugs. family history includes Heart attack in his father, paternal grandfather, and sister; Heart disease in his father; Hypertension in his mother; Stomach cancer in his mother; Stroke in his maternal  grandfather. Allergies  Allergen Reactions   Bactrim [Sulfamethoxazole-Trimethoprim] Itching    Review of Systems  Constitutional:  Negative for chills, fever and malaise/fatigue.  Eyes:  Negative for blurred vision.  Respiratory:  Negative for cough, hemoptysis, shortness of breath and wheezing.   Cardiovascular:  Negative for chest pain and leg swelling.  Neurological:  Negative for dizziness, weakness and headaches.      Objective:     BP 96/60 (BP Location: Left Arm, Patient Position: Sitting, Cuff Size: Normal)   Pulse 90   Temp 97.7 F (36.5 C) (Oral)   Ht 5\' 6"  (1.676 m)   Wt 127 lb 4.8 oz (57.7 kg)   SpO2 98%   BMI 20.55 kg/m  BP Readings from Last 3 Encounters:  11/04/22 96/60  10/21/22 128/68  10/17/22 116/66   Wt Readings from Last 3 Encounters:  11/04/22 127 lb 4.8 oz (57.7 kg)  10/21/22 134 lb 1.6 oz (60.8 kg)  10/15/22 134 lb 7.7 oz (61 kg)      Physical Exam Vitals reviewed.  Constitutional:      General: He is not in acute distress.    Appearance: He is well-developed. He is not ill-appearing.  Eyes:     Pupils: Pupils are equal, round, and reactive to light.  Neck:     Thyroid: No thyromegaly.  Cardiovascular:  Rate and Rhythm: Normal rate and regular rhythm.  Pulmonary:     Effort: Pulmonary effort is normal. No respiratory distress.     Breath sounds: Normal breath sounds. No wheezing or rales.  Musculoskeletal:     Cervical back: Neck supple.     Right lower leg: No edema.     Left lower leg: No edema.  Neurological:     Mental Status: He is alert and oriented to person, place, and time.      No results found for any visits on 11/04/22.  Last CBC Lab Results  Component Value Date   WBC 9.4 10/17/2022   HGB 11.2 (L) 10/17/2022   HCT 34.1 (L) 10/17/2022   MCV 101.5 (H) 10/17/2022   MCH 33.3 10/17/2022   RDW 14.0 10/17/2022   PLT 175 10/17/2022   Last metabolic panel Lab Results  Component Value Date   GLUCOSE 156 (H)  10/17/2022   NA 139 10/17/2022   K 3.9 10/17/2022   CL 109 10/17/2022   CO2 18 (L) 10/17/2022   BUN 25 (H) 10/17/2022   CREATININE 1.12 10/17/2022   GFRNONAA >60 10/17/2022   CALCIUM 8.2 (L) 10/17/2022   PROT 5.3 (L) 10/16/2022   ALBUMIN 2.6 (L) 10/16/2022   BILITOT 0.8 10/16/2022   ALKPHOS 38 10/16/2022   AST 30 10/16/2022   ALT 23 10/16/2022   ANIONGAP 12 10/17/2022   Last lipids Lab Results  Component Value Date   CHOL 155 10/25/2020   HDL 71.80 10/25/2020   LDLCALC 68 10/25/2020   LDLDIRECT 123.0 12/28/2018   TRIG 76.0 10/25/2020   CHOLHDL 2 10/25/2020      The ASCVD Risk score (Arnett DK, et al., 2019) failed to calculate for the following reasons:   The 2019 ASCVD risk score is only valid for ages 82 to 69    Assessment & Plan:   #1 recent COVID infection with multifocal pneumonia.  Clinically improved.  O2 sats 98% room air.  No recurrent fever.  No dyspnea.  No cough. -Obtain follow-up chest x-ray today -Repeat CBC and CMP  #2 hyperlipidemia treated with atorvastatin 20 mg daily.  Check lipid and CMP  #3 hypothyroidism treated with levothyroxine 50 mcg daily.  Recheck TSH.   #4 hypertension stable and well-controlled on lisinopril.  Return in about 6 months (around 05/04/2023).    Evelena Peat, MD

## 2022-11-18 DIAGNOSIS — H524 Presbyopia: Secondary | ICD-10-CM | POA: Diagnosis not present

## 2022-11-20 ENCOUNTER — Other Ambulatory Visit: Payer: Self-pay | Admitting: Family Medicine

## 2022-11-20 DIAGNOSIS — E039 Hypothyroidism, unspecified: Secondary | ICD-10-CM

## 2022-12-13 ENCOUNTER — Other Ambulatory Visit: Payer: Self-pay | Admitting: Family Medicine

## 2023-01-11 ENCOUNTER — Other Ambulatory Visit: Payer: Self-pay | Admitting: Family Medicine

## 2023-01-11 DIAGNOSIS — E039 Hypothyroidism, unspecified: Secondary | ICD-10-CM

## 2023-01-13 ENCOUNTER — Other Ambulatory Visit: Payer: Self-pay | Admitting: Family Medicine

## 2023-01-13 DIAGNOSIS — I10 Essential (primary) hypertension: Secondary | ICD-10-CM

## 2023-01-14 ENCOUNTER — Other Ambulatory Visit: Payer: Self-pay | Admitting: Family Medicine

## 2023-01-15 DIAGNOSIS — Z9849 Cataract extraction status, unspecified eye: Secondary | ICD-10-CM | POA: Diagnosis not present

## 2023-01-15 DIAGNOSIS — G8929 Other chronic pain: Secondary | ICD-10-CM | POA: Diagnosis not present

## 2023-01-24 ENCOUNTER — Ambulatory Visit (INDEPENDENT_AMBULATORY_CARE_PROVIDER_SITE_OTHER): Payer: Medicare Other | Admitting: Family Medicine

## 2023-01-24 ENCOUNTER — Encounter: Payer: Self-pay | Admitting: Family Medicine

## 2023-01-24 VITALS — BP 118/64 | HR 72 | Temp 97.7°F | Ht 66.0 in | Wt 138.6 lb

## 2023-01-24 DIAGNOSIS — E785 Hyperlipidemia, unspecified: Secondary | ICD-10-CM | POA: Diagnosis not present

## 2023-01-24 DIAGNOSIS — I10 Essential (primary) hypertension: Secondary | ICD-10-CM | POA: Diagnosis not present

## 2023-01-24 DIAGNOSIS — E039 Hypothyroidism, unspecified: Secondary | ICD-10-CM

## 2023-01-24 DIAGNOSIS — D7589 Other specified diseases of blood and blood-forming organs: Secondary | ICD-10-CM

## 2023-01-24 DIAGNOSIS — R202 Paresthesia of skin: Secondary | ICD-10-CM

## 2023-01-24 LAB — HEMOGLOBIN A1C: Hgb A1c MFr Bld: 5.7 % (ref 4.6–6.5)

## 2023-01-24 LAB — VITAMIN B12: Vitamin B-12: 506 pg/mL (ref 211–911)

## 2023-01-24 NOTE — Progress Notes (Signed)
Established Patient Office Visit  Subjective   Patient ID: Timothy Garrett, male    DOB: May 06, 1931  Age: 89 y.o. MRN: [id number]  No chief complaint on file.   HPI   Mr. Torain is seen in follow-up companied by daughter-in-law.  He had COVID back in September and lost quite a bit of weight during that time but has been eating better and in fact his weight is up 11 pounds today compared with September.  He feels like he is slowly regaining his strength.  Had follow-up chest x-ray last visit which showed no acute findings.  No obvious residual pneumonia.  He has not had RSV vaccine.  Has had seasonal flu vaccine.    He does have some neuropathy symptoms with little bit of numbness in feet and occasional burning sensation.  On low-dose gabapentin.  Previous CBCs have shown elevated MCV.  No recent B12 level.  Recent TSH was normal.  He does have known hypothyroidism.  He has had some mildly elevated glucose levels but no recent A1c.  Family has noted that he seems somewhat more forgetful.  He had recent home nurse visit and 3 out of 3 on word recall.  Had some difficulties with mini cog clock drawing.    Past Medical History:  Diagnosis Date   Arthritis    Diverticulitis    Diverticulitis    GERD (gastroesophageal reflux disease)    Heartburn   Hyperlipemia    Hypertension    IBS (irritable bowel syndrome)    Kidney stones    Past Surgical History:  Procedure Laterality Date   BACK SURGERY     EYE SURGERY  2016   cataract surgery   TONSILLECTOMY  1941   ulcer surgery      reports that he has quit smoking. He has never used smokeless tobacco. He reports that he does not drink alcohol and does not use drugs. family history includes Heart attack in his father, paternal grandfather, and sister; Heart disease in his father; Hypertension in his mother; Stomach cancer in his mother; Stroke in his maternal grandfather. Allergies  Allergen Reactions   Bactrim  [Sulfamethoxazole-Trimethoprim] Itching    Review of Systems  Constitutional:  Positive for malaise/fatigue.  Eyes:  Negative for blurred vision.  Respiratory:  Negative for cough and shortness of breath.   Cardiovascular:  Negative for chest pain.  Neurological:  Negative for dizziness, weakness and headaches.      Objective:     BP 118/64 (BP Location: Left Arm, Patient Position: Sitting, Cuff Size: Normal)   Pulse 72   Temp 97.7 F (36.5 C) (Oral)   Ht 5\' 6"  (1.676 m)   Wt 138 lb 9.6 oz (62.9 kg)   SpO2 98%   BMI 22.37 kg/m  BP Readings from Last 3 Encounters:  01/24/23 118/64  11/04/22 96/60  10/21/22 128/68   Wt Readings from Last 3 Encounters:  01/24/23 138 lb 9.6 oz (62.9 kg)  11/04/22 127 lb 4.8 oz (57.7 kg)  10/21/22 134 lb 1.6 oz (60.8 kg)      Physical Exam Vitals reviewed.  Constitutional:      Appearance: He is well-developed.  HENT:     Right Ear: External ear normal.     Left Ear: External ear normal.  Eyes:     Pupils: Pupils are equal, round, and reactive to light.  Neck:     Thyroid: No thyromegaly.  Cardiovascular:     Rate and Rhythm: Normal rate and  regular rhythm.  Pulmonary:     Effort: Pulmonary effort is normal. No respiratory distress.     Breath sounds: Normal breath sounds. No wheezing or rales.  Musculoskeletal:     Cervical back: Neck supple.  Neurological:     Mental Status: He is alert and oriented to person, place, and time.  Psychiatric:     Comments: Oriented to time, place, person.      No results found for any visits on 01/24/23.  Last CBC Lab Results  Component Value Date   WBC 6.4 11/04/2022   HGB 11.3 (L) 11/04/2022   HCT 33.5 (L) 11/04/2022   MCV 102.0 (H) 11/04/2022   MCH 33.3 10/17/2022   RDW 14.8 11/04/2022   PLT 339.0 11/04/2022   Last metabolic panel Lab Results  Component Value Date   GLUCOSE 109 (H) 11/04/2022   NA 140 11/04/2022   K 4.4 11/04/2022   CL 107 11/04/2022   CO2 25 11/04/2022    BUN 16 11/04/2022   CREATININE 1.25 11/04/2022   GFR 51.28 (L) 11/04/2022   CALCIUM 9.3 11/04/2022   PROT 6.3 11/04/2022   ALBUMIN 3.8 11/04/2022   BILITOT 0.6 11/04/2022   ALKPHOS 68 11/04/2022   AST 16 11/04/2022   ALT 17 11/04/2022   ANIONGAP 12 10/17/2022   Last thyroid functions Lab Results  Component Value Date   TSH 0.61 11/04/2022      The ASCVD Risk score (Arnett DK, et al., 2019) failed to calculate for the following reasons:   The 2019 ASCVD risk score is only valid for ages 84 to 36    Assessment & Plan:   #1 paresthesias involving feet.  Check B12 and A1c.  Patient already on low-dose gabapentin.  #2 macrocytosis on CBC with normal hemoglobin.  In view of his forgetfulness and neuropathy symptoms needs B12 to rule out deficiency.  He does not have any dietary factors that should be contributing.  No chronic PPI use.  No history of metformin use.  #3 hypothyroidism on low-dose levothyroxine.  Recent TSH at goal  #4 hyperlipidemia treated with atorvastatin.  Recent lipids and hepatic panel stable.  #5 hypertension history.  Lisinopril 20 mg daily.  Blood pressure stable.   Return in about 6 months (around 07/25/2023).    Evelena Peat, MD

## 2023-01-24 NOTE — Patient Instructions (Signed)
Would consider RSV vaccine.

## 2023-02-19 ENCOUNTER — Other Ambulatory Visit: Payer: Self-pay | Admitting: Family Medicine

## 2023-02-19 DIAGNOSIS — F419 Anxiety disorder, unspecified: Secondary | ICD-10-CM

## 2023-02-26 ENCOUNTER — Other Ambulatory Visit: Payer: Self-pay | Admitting: Family Medicine

## 2023-02-26 DIAGNOSIS — E039 Hypothyroidism, unspecified: Secondary | ICD-10-CM

## 2023-02-26 DIAGNOSIS — F419 Anxiety disorder, unspecified: Secondary | ICD-10-CM

## 2023-02-28 ENCOUNTER — Telehealth: Payer: Self-pay | Admitting: Family Medicine

## 2023-02-28 NOTE — Telephone Encounter (Signed)
Copied from CRM [id number]. Topic: Clinical - Medication Refill >> Feb 28, 2023  9:46 AM Denese Killings wrote: Most Recent Primary Care Visit:  Provider: Kristian Covey  Department: [facility]  Visit Type: OFFICE VISIT  Date: 01/24/2023  Medication: traMADol (ULTRAM) 50 MG tablet  Has the patient contacted their pharmacy? Yes (Agent: If no, request that the patient contact the pharmacy for the refill. If patient does not wish to contact the pharmacy document the reason why and proceed with request.) (Agent: If yes, when and what did the pharmacy advise?) advised to call doctors office    Is this the correct pharmacy for this prescription?  If no, delete pharmacy and type the correct one.  This is the patient's preferred pharmacy:  [id number] - [place], [place] - [address] AT [facility] OF [address] [place] [address] Phone: [phone] Fax: [phone]  [facility] [address] [place] [address] Phone: [phone] Fax: [phone]  [facility] [phone] [place], [place] - [address] AT [facility] OF [address] & [facility] [address] [place] [address] Phone: [phone] Fax: [phone]  [facility] [address] [place] [address] Phone: [phone] Fax: [phone]   Has the prescription been filled recently?   Is the patient out of the medication?   Has the patient been seen for an appointment in the last year OR does the patient have an upcoming appointment?   Can we respond through MyChart?   Agent: Please be advised that Rx refills may take up to 3 business days. We ask that you follow-up with your pharmacy.

## 2023-02-28 NOTE — Telephone Encounter (Unsigned)
Copied from CRM [id number]. Topic: Clinical - Prescription Issue >> Feb 28, 2023  9:53 AM Denese Killings wrote: Reason for CRM: Patient wife is trying to fill husband medication for traMADol (ULTRAM) 50 MG tablet and its been 2 years since last fill. Patient wants that medication sent to [facility] on [address].

## 2023-03-01 MED ORDER — TRAMADOL HCL 50 MG PO TABS: 50.0000 mg | ORAL_TABLET | Freq: Four times a day (QID) | ORAL | 0 refills | Status: DC | PRN

## 2023-03-01 NOTE — Telephone Encounter (Signed)
I sent in some limited Tramadol.  Kristian Covey MD [facility] at [facility]

## 2023-03-03 NOTE — Telephone Encounter (Signed)
Noted  

## 2023-03-13 ENCOUNTER — Other Ambulatory Visit: Payer: Self-pay | Admitting: Family Medicine

## 2023-03-21 ENCOUNTER — Other Ambulatory Visit: Payer: Self-pay | Admitting: Family Medicine

## 2023-04-09 ENCOUNTER — Other Ambulatory Visit: Payer: Self-pay | Admitting: Family Medicine

## 2023-04-11 ENCOUNTER — Other Ambulatory Visit: Payer: Self-pay | Admitting: Family Medicine

## 2023-04-11 DIAGNOSIS — I10 Essential (primary) hypertension: Secondary | ICD-10-CM

## 2023-04-17 DIAGNOSIS — M5416 Radiculopathy, lumbar region: Secondary | ICD-10-CM | POA: Diagnosis not present

## 2023-04-20 ENCOUNTER — Other Ambulatory Visit: Payer: Self-pay | Admitting: Family Medicine

## 2023-04-26 ENCOUNTER — Other Ambulatory Visit: Payer: Self-pay | Admitting: Family Medicine

## 2023-04-28 ENCOUNTER — Ambulatory Visit: Payer: Self-pay | Admitting: Family Medicine

## 2023-04-28 DIAGNOSIS — R319 Hematuria, unspecified: Secondary | ICD-10-CM | POA: Diagnosis not present

## 2023-04-28 DIAGNOSIS — R1032 Left lower quadrant pain: Secondary | ICD-10-CM | POA: Diagnosis not present

## 2023-04-28 DIAGNOSIS — R110 Nausea: Secondary | ICD-10-CM | POA: Diagnosis not present

## 2023-04-28 NOTE — Telephone Encounter (Signed)
 Chief Complaint: Left abdominal pain Symptoms: Left abdominal pain Frequency: since the weekend Pertinent Negatives: Patient denies fever Disposition: [] ED /[x] Urgent Care (no appt availability in office) / [] Appointment(In office/virtual)/ []  [facility]/ [] Home Care/ [] Refused Recommended Disposition /[] [facility]/ []  Follow-up with PCP Additional Notes: Patient's daughter called on behalf of patient, and patient is not with daughter currently. Daughter reports father stating he has had intense left side abdominal pain since the weekend and believes it may be a diverticulitis flare up. Patient denies fever but states he's had nausea also. Daughter is asking for appt for today for antibiotics. No availability left for remainder of day, but assisted daughter with information and wait time on nearest urgent cares. Daughter states she will be taking patient to nearest urgent care.   Copied from CRM [id number]. Topic: Clinical - Red Word Triage >> Apr 28, 2023  4:04 PM Denese Killings wrote: Red Word that prompted transfer to Nurse Triage: Patient is having a Diverticulitis Flare up.  Symptoms- severe pain in his left (not sure) side. Reason for Disposition  [1] MILD-MODERATE pain AND [2] constant AND [3] present > 2 hours  Answer Assessment - Initial Assessment Questions 1. LOCATION: "Where does it hurt?"      Left side 2. RADIATION: "Does the pain shoot anywhere else?" (e.g., chest, back)     Patient didn't state 3. ONSET: "When did the pain begin?" (Minutes, hours or days ago)      Over the weekend 4. SUDDEN: "Gradual or sudden onset?"     N/a 5. PATTERN "Does the pain come and go, or is it constant?"    - If it comes and goes: "How long does it last?" "Do you have pain now?"     (Note: Comes and goes means the pain is intermittent. It goes away completely between bouts.)    - If constant: "Is it getting better, staying the same, or getting worse?"      (Note: Constant  means the pain never goes away completely; most serious pain is constant and gets worse.)      Constant 6. SEVERITY: "How bad is the pain?"  (e.g., Scale 1-10; mild, moderate, or severe)    - MILD (1-3): Doesn't interfere with normal activities, abdomen soft and not tender to touch.     - MODERATE (4-7): Interferes with normal activities or awakens from sleep, abdomen tender to touch.     - SEVERE (8-10): Excruciating pain, doubled over, unable to do any normal activities.       Unsure - similar to diverticulitis  7. RECURRENT SYMPTOM: "Have you ever had this type of stomach pain before?" If Yes, ask: "When was the last time?" and "What happened that time?"      Yes - compared to past flare up of diverticulitis 8. CAUSE: "What do you think is causing the stomach pain?"     Diverticulitis 9. RELIEVING/AGGRAVATING FACTORS: "What makes it better or worse?" (e.g., antacids, bending or twisting motion, bowel movement)     N/a 10. OTHER SYMPTOMS: "Do you have any other symptoms?" (e.g., back pain, diarrhea, fever, urination pain, vomiting)       Nauseous  Protocols used: Abdominal Pain - Male-A-AH

## 2023-05-03 ENCOUNTER — Emergency Department (HOSPITAL_BASED_OUTPATIENT_CLINIC_OR_DEPARTMENT_OTHER)

## 2023-05-03 ENCOUNTER — Observation Stay (HOSPITAL_BASED_OUTPATIENT_CLINIC_OR_DEPARTMENT_OTHER)
Admission: EM | Admit: 2023-05-03 | Discharge: 2023-05-04 | Disposition: A | Discharge status: Home / Self Care | Attending: Internal Medicine | Admitting: Internal Medicine

## 2023-05-03 ENCOUNTER — Emergency Department (HOSPITAL_COMMUNITY): Admitting: Anesthesiology

## 2023-05-03 ENCOUNTER — Encounter (HOSPITAL_BASED_OUTPATIENT_CLINIC_OR_DEPARTMENT_OTHER): Payer: Self-pay

## 2023-05-03 ENCOUNTER — Emergency Department (HOSPITAL_BASED_OUTPATIENT_CLINIC_OR_DEPARTMENT_OTHER): Admitting: Anesthesiology

## 2023-05-03 ENCOUNTER — Encounter (HOSPITAL_COMMUNITY)
Admission: EM | Disposition: A | Discharge status: Home / Self Care | Payer: Self-pay | Source: Home / Self Care | Attending: Emergency Medicine

## 2023-05-03 ENCOUNTER — Inpatient Hospital Stay (HOSPITAL_COMMUNITY)

## 2023-05-03 ENCOUNTER — Other Ambulatory Visit: Payer: Self-pay

## 2023-05-03 DIAGNOSIS — I10 Essential (primary) hypertension: Secondary | ICD-10-CM | POA: Diagnosis present

## 2023-05-03 DIAGNOSIS — K8689 Other specified diseases of pancreas: Secondary | ICD-10-CM | POA: Diagnosis not present

## 2023-05-03 DIAGNOSIS — N132 Hydronephrosis with renal and ureteral calculous obstruction: Secondary | ICD-10-CM | POA: Diagnosis not present

## 2023-05-03 DIAGNOSIS — N400 Enlarged prostate without lower urinary tract symptoms: Secondary | ICD-10-CM | POA: Diagnosis present

## 2023-05-03 DIAGNOSIS — N179 Acute kidney failure, unspecified: Principal | ICD-10-CM | POA: Diagnosis present

## 2023-05-03 DIAGNOSIS — I129 Hypertensive chronic kidney disease with stage 1 through stage 4 chronic kidney disease, or unspecified chronic kidney disease: Secondary | ICD-10-CM | POA: Diagnosis not present

## 2023-05-03 DIAGNOSIS — N201 Calculus of ureter: Secondary | ICD-10-CM

## 2023-05-03 DIAGNOSIS — K219 Gastro-esophageal reflux disease without esophagitis: Secondary | ICD-10-CM | POA: Diagnosis not present

## 2023-05-03 DIAGNOSIS — N1330 Unspecified hydronephrosis: Secondary | ICD-10-CM | POA: Diagnosis not present

## 2023-05-03 DIAGNOSIS — Z87891 Personal history of nicotine dependence: Secondary | ICD-10-CM | POA: Insufficient documentation

## 2023-05-03 DIAGNOSIS — N183 Chronic kidney disease, stage 3 unspecified: Secondary | ICD-10-CM | POA: Diagnosis not present

## 2023-05-03 DIAGNOSIS — E785 Hyperlipidemia, unspecified: Secondary | ICD-10-CM | POA: Diagnosis not present

## 2023-05-03 DIAGNOSIS — E039 Hypothyroidism, unspecified: Secondary | ICD-10-CM | POA: Diagnosis not present

## 2023-05-03 DIAGNOSIS — K5730 Diverticulosis of large intestine without perforation or abscess without bleeding: Secondary | ICD-10-CM | POA: Diagnosis not present

## 2023-05-03 DIAGNOSIS — N200 Calculus of kidney: Secondary | ICD-10-CM

## 2023-05-03 DIAGNOSIS — R1032 Left lower quadrant pain: Secondary | ICD-10-CM | POA: Diagnosis not present

## 2023-05-03 DIAGNOSIS — N1832 Chronic kidney disease, stage 3b: Secondary | ICD-10-CM | POA: Diagnosis present

## 2023-05-03 DIAGNOSIS — N23 Unspecified renal colic: Secondary | ICD-10-CM | POA: Diagnosis not present

## 2023-05-03 DIAGNOSIS — Z79899 Other long term (current) drug therapy: Secondary | ICD-10-CM | POA: Diagnosis not present

## 2023-05-03 HISTORY — PX: CYSTOSCOPY/URETEROSCOPY/HOLMIUM LASER/STENT PLACEMENT: SHX6546

## 2023-05-03 LAB — CBC WITH DIFFERENTIAL/PLATELET
Abs Immature Granulocytes: 0.06 10*3/uL (ref 0.00–0.07)
Basophils Absolute: 0.1 10*3/uL (ref 0.0–0.1)
Basophils Relative: 1 %
Eosinophils Absolute: 0.2 10*3/uL (ref 0.0–0.5)
Eosinophils Relative: 1 %
HCT: 38.8 % — ABNORMAL LOW (ref 39.0–52.0)
Hemoglobin: 12.6 g/dL — ABNORMAL LOW (ref 13.0–17.0)
Immature Granulocytes: 0 %
Lymphocytes Relative: 5 %
Lymphs Abs: 0.7 10*3/uL (ref 0.7–4.0)
MCH: 34.1 pg — ABNORMAL HIGH (ref 26.0–34.0)
MCHC: 32.5 g/dL (ref 30.0–36.0)
MCV: 105.1 fL — ABNORMAL HIGH (ref 80.0–100.0)
Monocytes Absolute: 1.4 10*3/uL — ABNORMAL HIGH (ref 0.1–1.0)
Monocytes Relative: 10 %
Neutro Abs: 11.6 10*3/uL — ABNORMAL HIGH (ref 1.7–7.7)
Neutrophils Relative %: 83 %
Platelets: 249 10*3/uL (ref 150–400)
RBC: 3.69 MIL/uL — ABNORMAL LOW (ref 4.22–5.81)
RDW: 13.2 % (ref 11.5–15.5)
WBC: 14 10*3/uL — ABNORMAL HIGH (ref 4.0–10.5)
nRBC: 0 % (ref 0.0–0.2)

## 2023-05-03 LAB — COMPREHENSIVE METABOLIC PANEL
ALT: 14 U/L (ref 0–44)
AST: 14 U/L — ABNORMAL LOW (ref 15–41)
Albumin: 4.1 g/dL (ref 3.5–5.0)
Alkaline Phosphatase: 55 U/L (ref 38–126)
Anion gap: 8 (ref 5–15)
BUN: 27 mg/dL — ABNORMAL HIGH (ref 8–23)
CO2: 24 mmol/L (ref 22–32)
Calcium: 9.1 mg/dL (ref 8.9–10.3)
Chloride: 103 mmol/L (ref 98–111)
Creatinine, Ser: 1.7 mg/dL — ABNORMAL HIGH (ref 0.61–1.24)
GFR, Estimated: 38 mL/min — ABNORMAL LOW (ref >60)
Glucose, Bld: 128 mg/dL — ABNORMAL HIGH (ref 70–99)
Potassium: 4.5 mmol/L (ref 3.5–5.1)
Sodium: 135 mmol/L (ref 135–145)
Total Bilirubin: 1 mg/dL (ref 0.0–1.2)
Total Protein: 6.5 g/dL (ref 6.5–8.1)

## 2023-05-03 LAB — URINALYSIS, ROUTINE W REFLEX MICROSCOPIC
Bacteria, UA: NONE SEEN
Bilirubin Urine: NEGATIVE
Glucose, UA: NEGATIVE mg/dL
Ketones, ur: NEGATIVE mg/dL
Leukocytes,Ua: NEGATIVE
Nitrite: NEGATIVE
Protein, ur: NEGATIVE mg/dL
Specific Gravity, Urine: >1.046 — ABNORMAL HIGH (ref 1.005–1.030)
pH: 5.5 (ref 5.0–8.0)

## 2023-05-03 LAB — LIPASE, BLOOD: Lipase: 25 U/L (ref 11–51)

## 2023-05-03 SURGERY — CYSTOSCOPY/URETEROSCOPY/HOLMIUM LASER/STENT PLACEMENT
Anesthesia: General | Laterality: Bilateral

## 2023-05-03 MED ORDER — FENTANYL CITRATE PF 50 MCG/ML IJ SOSY
50.0000 ug | PREFILLED_SYRINGE | Freq: Once | INTRAMUSCULAR | Status: AC
  Administered 2023-05-03: 50 ug via INTRAVENOUS
  Filled 2023-05-03: qty 1

## 2023-05-03 MED ORDER — OXYCODONE HCL 5 MG/5ML PO SOLN: 5.0000 mg | Freq: Once | ORAL | Status: DC | PRN

## 2023-05-03 MED ORDER — ATORVASTATIN CALCIUM 10 MG PO TABS
10.0000 mg | ORAL_TABLET | Freq: Every day | ORAL | Status: DC
  Administered 2023-05-03: 10 mg via ORAL
  Filled 2023-05-03: qty 1

## 2023-05-03 MED ORDER — ONDANSETRON HCL 4 MG/2ML IJ SOLN
4.0000 mg | Freq: Once | INTRAMUSCULAR | Status: AC
  Administered 2023-05-03: 4 mg via INTRAVENOUS
  Filled 2023-05-03: qty 2

## 2023-05-03 MED ORDER — SODIUM CHLORIDE 0.9 % IR SOLN
Status: DC | PRN
  Administered 2023-05-03: 6000 mL via INTRAVESICAL

## 2023-05-03 MED ORDER — CEFAZOLIN SODIUM-DEXTROSE 2-4 GM/100ML-% IV SOLN
INTRAVENOUS | Status: AC
  Filled 2023-05-03: qty 100

## 2023-05-03 MED ORDER — PROPOFOL 10 MG/ML IV BOLUS
INTRAVENOUS | Status: AC
  Filled 2023-05-03: qty 20

## 2023-05-03 MED ORDER — DEXAMETHASONE SODIUM PHOSPHATE 10 MG/ML IJ SOLN
INTRAMUSCULAR | Status: AC
  Filled 2023-05-03: qty 1

## 2023-05-03 MED ORDER — TAMSULOSIN HCL 0.4 MG PO CAPS
0.4000 mg | ORAL_CAPSULE | Freq: Every day | ORAL | Status: DC
  Administered 2023-05-03: 0.4 mg via ORAL
  Filled 2023-05-03: qty 1

## 2023-05-03 MED ORDER — GABAPENTIN 300 MG PO CAPS
300.0000 mg | ORAL_CAPSULE | Freq: Every day | ORAL | Status: DC
  Administered 2023-05-03: 300 mg via ORAL
  Filled 2023-05-03: qty 1

## 2023-05-03 MED ORDER — ACETAMINOPHEN 10 MG/ML IV SOLN
INTRAVENOUS | Status: DC | PRN
  Administered 2023-05-03: 1000 mg via INTRAVENOUS

## 2023-05-03 MED ORDER — FENTANYL CITRATE PF 50 MCG/ML IJ SOSY: 25.0000 ug | PREFILLED_SYRINGE | INTRAMUSCULAR | Status: DC | PRN

## 2023-05-03 MED ORDER — FENTANYL CITRATE (PF) 100 MCG/2ML IJ SOLN
INTRAMUSCULAR | Status: AC
Start: 2023-05-03 — End: ?
  Filled 2023-05-03: qty 2

## 2023-05-03 MED ORDER — OXYCODONE HCL 5 MG PO TABS: 5.0000 mg | ORAL_TABLET | Freq: Once | ORAL | Status: DC | PRN

## 2023-05-03 MED ORDER — FENTANYL CITRATE PF 50 MCG/ML IJ SOSY
50.0000 ug | PREFILLED_SYRINGE | INTRAMUSCULAR | Status: DC | PRN
  Administered 2023-05-03: 50 ug via INTRAVENOUS
  Filled 2023-05-03: qty 1

## 2023-05-03 MED ORDER — FENTANYL CITRATE (PF) 100 MCG/2ML IJ SOLN
INTRAMUSCULAR | Status: DC | PRN
  Administered 2023-05-03: 50 ug via INTRAVENOUS

## 2023-05-03 MED ORDER — IOHEXOL 300 MG/ML  SOLN
80.0000 mL | Freq: Once | INTRAMUSCULAR | Status: AC | PRN
  Administered 2023-05-03: 80 mL via INTRAVENOUS

## 2023-05-03 MED ORDER — LEVOTHYROXINE SODIUM 50 MCG PO TABS
50.0000 ug | ORAL_TABLET | Freq: Every day | ORAL | Status: DC
  Administered 2023-05-04: 50 ug via ORAL
  Filled 2023-05-03: qty 1

## 2023-05-03 MED ORDER — HYDRALAZINE HCL 25 MG PO TABS: 25.0000 mg | ORAL_TABLET | Freq: Four times a day (QID) | ORAL | Status: DC | PRN

## 2023-05-03 MED ORDER — PHENYLEPHRINE 80 MCG/ML (10ML) SYRINGE FOR IV PUSH (FOR BLOOD PRESSURE SUPPORT)
PREFILLED_SYRINGE | INTRAVENOUS | Status: DC | PRN
Start: 2023-05-03 — End: 2023-05-03
  Administered 2023-05-03: 80 ug via INTRAVENOUS
  Administered 2023-05-03: 160 ug via INTRAVENOUS
  Administered 2023-05-03 (×4): 80 ug via INTRAVENOUS

## 2023-05-03 MED ORDER — FENTANYL CITRATE PF 50 MCG/ML IJ SOSY
25.0000 ug | PREFILLED_SYRINGE | INTRAMUSCULAR | Status: DC | PRN
  Administered 2023-05-03: 50 ug via INTRAVENOUS

## 2023-05-03 MED ORDER — ONDANSETRON HCL 4 MG/2ML IJ SOLN: 4.0000 mg | Freq: Four times a day (QID) | INTRAMUSCULAR | Status: DC | PRN

## 2023-05-03 MED ORDER — ONDANSETRON HCL 4 MG/2ML IJ SOLN
INTRAMUSCULAR | Status: AC
  Filled 2023-05-03: qty 2

## 2023-05-03 MED ORDER — HYDROMORPHONE HCL 1 MG/ML IJ SOLN
0.5000 mg | Freq: Once | INTRAMUSCULAR | Status: AC
  Administered 2023-05-03: 0.5 mg via INTRAVENOUS
  Filled 2023-05-03: qty 1

## 2023-05-03 MED ORDER — PHENAZOPYRIDINE HCL 100 MG PO TABS: 100.0000 mg | ORAL_TABLET | Freq: Three times a day (TID) | ORAL | Status: DC | PRN

## 2023-05-03 MED ORDER — LIDOCAINE HCL (PF) 2 % IJ SOLN
INTRAMUSCULAR | Status: AC
  Filled 2023-05-03: qty 5

## 2023-05-03 MED ORDER — AMISULPRIDE (ANTIEMETIC) 5 MG/2ML IV SOLN: 10.0000 mg | Freq: Once | INTRAVENOUS | Status: DC | PRN

## 2023-05-03 MED ORDER — SODIUM CHLORIDE 0.9 % IV SOLN: INTRAVENOUS | Status: AC | PRN

## 2023-05-03 MED ORDER — HYDROMORPHONE HCL 1 MG/ML IJ SOLN
1.0000 mg | Freq: Once | INTRAMUSCULAR | Status: AC
  Administered 2023-05-03: 1 mg via INTRAVENOUS
  Filled 2023-05-03: qty 1

## 2023-05-03 MED ORDER — ONDANSETRON HCL 4 MG/2ML IJ SOLN
INTRAMUSCULAR | Status: DC | PRN
Start: 2023-05-03 — End: 2023-05-03
  Administered 2023-05-03: 4 mg via INTRAVENOUS

## 2023-05-03 MED ORDER — LIDOCAINE HCL (PF) 2 % IJ SOLN
INTRAMUSCULAR | Status: DC | PRN
  Administered 2023-05-03: 60 mg via INTRADERMAL

## 2023-05-03 MED ORDER — FENTANYL CITRATE PF 50 MCG/ML IJ SOSY
PREFILLED_SYRINGE | INTRAMUSCULAR | Status: AC
  Filled 2023-05-03: qty 1

## 2023-05-03 MED ORDER — PHENYLEPHRINE 80 MCG/ML (10ML) SYRINGE FOR IV PUSH (FOR BLOOD PRESSURE SUPPORT)
PREFILLED_SYRINGE | INTRAVENOUS | Status: AC
  Filled 2023-05-03: qty 10

## 2023-05-03 MED ORDER — LACTATED RINGERS IV SOLN: INTRAVENOUS | Status: DC | PRN

## 2023-05-03 MED ORDER — IOHEXOL 300 MG/ML  SOLN
INTRAMUSCULAR | Status: DC | PRN
  Administered 2023-05-03: 23 mL

## 2023-05-03 MED ORDER — DEXAMETHASONE SODIUM PHOSPHATE 10 MG/ML IJ SOLN
INTRAMUSCULAR | Status: DC | PRN
  Administered 2023-05-03: 5 mg via INTRAVENOUS

## 2023-05-03 MED ORDER — LISINOPRIL 20 MG PO TABS
20.0000 mg | ORAL_TABLET | Freq: Every day | ORAL | Status: DC
  Administered 2023-05-03: 20 mg via ORAL
  Filled 2023-05-03: qty 1

## 2023-05-03 MED ORDER — ACETAMINOPHEN 10 MG/ML IV SOLN
INTRAVENOUS | Status: AC
  Filled 2023-05-03: qty 100

## 2023-05-03 MED ORDER — SODIUM CHLORIDE 0.9 % IV BOLUS
1000.0000 mL | Freq: Once | INTRAVENOUS | Status: AC
  Administered 2023-05-03: 1000 mL via INTRAVENOUS

## 2023-05-03 MED ORDER — ONDANSETRON HCL 4 MG PO TABS: 4.0000 mg | ORAL_TABLET | Freq: Four times a day (QID) | ORAL | Status: DC | PRN

## 2023-05-03 MED ORDER — HYDROMORPHONE HCL 1 MG/ML IJ SOLN: 0.5000 mg | INTRAMUSCULAR | Status: DC | PRN

## 2023-05-03 MED ORDER — ACETAMINOPHEN 325 MG PO TABS
650.0000 mg | ORAL_TABLET | Freq: Four times a day (QID) | ORAL | Status: DC | PRN
  Administered 2023-05-03: 650 mg via ORAL
  Filled 2023-05-03: qty 2

## 2023-05-03 MED ORDER — OXYCODONE HCL 5 MG PO TABS: 2.5000 mg | ORAL_TABLET | ORAL | Status: DC | PRN

## 2023-05-03 MED ORDER — ACETAMINOPHEN 650 MG RE SUPP: 650.0000 mg | Freq: Four times a day (QID) | RECTAL | Status: DC | PRN

## 2023-05-03 MED ORDER — CEFAZOLIN SODIUM-DEXTROSE 2-3 GM-%(50ML) IV SOLR
INTRAVENOUS | Status: DC | PRN
  Administered 2023-05-03: 2 g via INTRAVENOUS

## 2023-05-03 MED ORDER — PROPOFOL 10 MG/ML IV BOLUS
INTRAVENOUS | Status: DC | PRN
  Administered 2023-05-03: 100 mg via INTRAVENOUS

## 2023-05-03 SURGICAL SUPPLY — 23 items
BAG URINE DRAIN 2000ML AR STRL (UROLOGICAL SUPPLIES) IMPLANT
BAG URO CATCHER STRL LF (MISCELLANEOUS) ×1 IMPLANT
BASKET ZERO TIP NITINOL 2.4FR (BASKET) IMPLANT
CATH FOLEY 2WAY SLVR 5CC 18FR (CATHETERS) IMPLANT
CATH SET URETHRAL DILATOR (CATHETERS) IMPLANT
CATH URETL OPEN 5X70 (CATHETERS) ×1 IMPLANT
CLOTH BEACON ORANGE TIMEOUT ST (SAFETY) ×1 IMPLANT
EXTRACTOR STONE NITINOL NGAGE (UROLOGICAL SUPPLIES) IMPLANT
FIBER LASER MOSES 200 DFL (Laser) IMPLANT
FIBER LASER MOSES 365 DFL (Laser) IMPLANT
GLOVE SURG LX STRL 8.0 MICRO (GLOVE) ×1 IMPLANT
GOWN STRL SURGICAL XL XLNG (GOWN DISPOSABLE) ×1 IMPLANT
GUIDEWIRE STR DUAL SENSOR (WIRE) IMPLANT
GUIDEWIRE ZIPWRE .038 STRAIGHT (WIRE) ×1 IMPLANT
KIT TURNOVER KIT A (KITS) IMPLANT
MANIFOLD NEPTUNE II (INSTRUMENTS) ×1 IMPLANT
PACK CYSTO (CUSTOM PROCEDURE TRAY) ×1 IMPLANT
SHEATH NAVIGATOR HD 11/13X36 (SHEATH) IMPLANT
STENT URET 6FRX24 CONTOUR (STENTS) IMPLANT
STENT URET 6FRX26 CONTOUR (STENTS) IMPLANT
TRACTIP FLEXIVA PULSE ID 200 (Laser) IMPLANT
TUBING CONNECTING 10 (TUBING) ×1 IMPLANT
TUBING UROLOGY SET (TUBING) ×1 IMPLANT

## 2023-05-03 NOTE — Consult Note (Signed)
 Urology Consult   Physician requesting consult: Sanda Klein, MD   Reason for consult: Bilateral ureteral stones  History of Present Illness: Timothy Garrett is a 89 y.o. male who presented to the [facility] department with worsening left-sided flank pain.  He was diagnosed with a 5 mm left distal ureteral calculus as well as a punctate right distal ureteral stone.  The left-sided stone is causing concomitant hydronephrosis, there is no notable hydronephrosis involving the right collecting system.  He has a remote history of kidney stones, but has never required surgical intervention.  He denies interval nausea/vomiting, fever/chills, dysuria or gross hematuria.  His serum creatinine is mildly elevated at 1.7 from a baseline of approximately 1.1.    Past Medical History:  Diagnosis Date   Arthritis    Diverticulitis    Diverticulitis    GERD (gastroesophageal reflux disease)    Heartburn   Hyperlipemia    Hypertension    IBS (irritable bowel syndrome)    Kidney stones     Past Surgical History:  Procedure Laterality Date   BACK SURGERY     EYE SURGERY  2016   cataract surgery   TONSILLECTOMY  1941   ulcer surgery      Current Hospital Medications:  Home Meds:  No outpatient medications have been marked as taking for the 05/03/23 encounter [facility] Encounter).    Scheduled Meds: Continuous Infusions:  [MAR Hold] sodium chloride     acetaminophen     ceFAZolin     PRN Meds:.[MAR Hold] sodium chloride, acetaminophen, ceFAZolin  Allergies:  Allergies  Allergen Reactions   Bactrim [Sulfamethoxazole-Trimethoprim] Itching    Family History  Problem Relation Age of Onset   Stomach cancer Mother    Hypertension Mother    Heart disease Father    Heart attack Father    Heart attack Sister    Stroke Maternal Grandfather    Heart attack Paternal Grandfather     Social History:  reports that he has quit smoking. He has never used smokeless tobacco. He  reports that he does not drink alcohol and does not use drugs.  ROS: A complete review of systems was performed.  All systems are negative except for pertinent findings as noted.  Physical Exam:  Vital signs in last 24 hours: Temp:  [97.6 F (36.4 C)-98.2 F (36.8 C)] 97.6 F (36.4 C) (03/22 1111) Pulse Rate:  [78-93] 93 (03/22 1111) Resp:  [12-19] 19 (03/22 1111) BP: (124-155)/(71-92) 154/92 (03/22 1111) SpO2:  [94 %-100 %] 99 % (03/22 1111) Weight:  [59 kg] 59 kg (03/22 0711) Constitutional:  Alert and oriented, No acute distress Cardiovascular: Regular rate and rhythm, No JVD Respiratory: Normal respiratory effort, Lungs clear bilaterally cits Psychiatric: Normal mood and affect  Laboratory Data:  Recent Labs    05/03/23 0728  WBC 14.0*  HGB 12.6*  HCT 38.8*  PLT 249    Recent Labs    05/03/23 0728  NA 135  K 4.5  CL 103  GLUCOSE 128*  BUN 27*  CALCIUM 9.1  CREATININE 1.70*     Results for orders placed or performed during the hospital encounter of 05/03/23 (from the past 24 hours)  Comprehensive metabolic panel     Status: Abnormal   Collection Time: 05/03/23  7:28 AM  Result Value Ref Range   Sodium 135 135 - 145 mmol/L   Potassium 4.5 3.5 - 5.1 mmol/L   Chloride 103 98 - 111 mmol/L   CO2 24 22 -  32 mmol/L   Glucose, Bld 128 (H) 70 - 99 mg/dL   BUN 27 (H) 8 - 23 mg/dL   Creatinine, Ser 0.98 (H) 0.61 - 1.24 mg/dL   Calcium 9.1 8.9 - 11.9 mg/dL   Total Protein 6.5 6.5 - 8.1 g/dL   Albumin 4.1 3.5 - 5.0 g/dL   AST 14 (L) 15 - 41 U/L   ALT 14 0 - 44 U/L   Alkaline Phosphatase 55 38 - 126 U/L   Total Bilirubin 1.0 0.0 - 1.2 mg/dL   GFR, Estimated 38 (L) >60 mL/min   Anion gap 8 5 - 15  CBC with Differential (PNL)     Status: Abnormal   Collection Time: 05/03/23  7:28 AM  Result Value Ref Range   WBC 14.0 (H) 4.0 - 10.5 K/uL   RBC 3.69 (L) 4.22 - 5.81 MIL/uL   Hemoglobin 12.6 (L) 13.0 - 17.0 g/dL   HCT 14.7 (L) 82.9 - 56.2 %   MCV 105.1 (H) 80.0  - 100.0 fL   MCH 34.1 (H) 26.0 - 34.0 pg   MCHC 32.5 30.0 - 36.0 g/dL   RDW 13.0 86.5 - 78.4 %   Platelets 249 150 - 400 K/uL   nRBC 0.0 0.0 - 0.2 %   Neutrophils Relative % 83 %   Neutro Abs 11.6 (H) 1.7 - 7.7 K/uL   Lymphocytes Relative 5 %   Lymphs Abs 0.7 0.7 - 4.0 K/uL   Monocytes Relative 10 %   Monocytes Absolute 1.4 (H) 0.1 - 1.0 K/uL   Eosinophils Relative 1 %   Eosinophils Absolute 0.2 0.0 - 0.5 K/uL   Basophils Relative 1 %   Basophils Absolute 0.1 0.0 - 0.1 K/uL   Immature Granulocytes 0 %   Abs Immature Granulocytes 0.06 0.00 - 0.07 K/uL  Urinalysis, Routine w reflex microscopic -Urine, Clean Catch     Status: Abnormal   Collection Time: 05/03/23  7:28 AM  Result Value Ref Range   Color, Urine COLORLESS (A) YELLOW   APPearance CLEAR CLEAR   Specific Gravity, Urine >1.046 (H) 1.005 - 1.030   pH 5.5 5.0 - 8.0   Glucose, UA NEGATIVE NEGATIVE mg/dL   Hgb urine dipstick TRACE (A) NEGATIVE   Bilirubin Urine NEGATIVE NEGATIVE   Ketones, ur NEGATIVE NEGATIVE mg/dL   Protein, ur NEGATIVE NEGATIVE mg/dL   Nitrite NEGATIVE NEGATIVE   Leukocytes,Ua NEGATIVE NEGATIVE   RBC / HPF 0-5 0 - 5 RBC/hpf   WBC, UA 0-5 0 - 5 WBC/hpf   Bacteria, UA NONE SEEN NONE SEEN   Squamous Epithelial / HPF 0-5 0 - 5 /HPF   Mucus PRESENT   Lipase, blood     Status: None   Collection Time: 05/03/23  7:28 AM  Result Value Ref Range   Lipase 25 11 - 51 U/L   No results found for this or any previous visit (from the past 240 hours).  Renal Function: Recent Labs    05/03/23 6962  CREATININE 1.70*   Estimated Creatinine Clearance: 24.6 mL/min (A) (by C-G formula based on SCr of 1.7 mg/dL (H)).  Radiologic Imaging: CT ABDOMEN PELVIS W CONTRAST Result Date: 05/03/2023 CLINICAL DATA:  Left flank pain with hematuria. EXAM: CT ABDOMEN AND PELVIS WITH CONTRAST TECHNIQUE: Multidetector CT imaging of the abdomen and pelvis was performed using the standard protocol following bolus administration of  intravenous contrast. RADIATION DOSE REDUCTION: This exam was performed according to the departmental dose-optimization program which includes automated exposure control,  adjustment of the mA and/or kV according to patient size and/or use of iterative reconstruction technique. CONTRAST:  80mL OMNIPAQUE IOHEXOL 300 MG/ML  SOLN COMPARISON:  03/02/2021 FINDINGS: Lower chest: Unremarkable. Hepatobiliary: No suspicious focal abnormality within the liver parenchyma. There is no evidence for gallstones, gallbladder wall thickening, or pericholecystic fluid. No intrahepatic or extrahepatic biliary dilation. Pancreas: No focal mass lesion. No dilatation of the main duct. No intraparenchymal cyst. No peripancreatic edema. Scattered pancreatic parenchymal calcification suggests a history of chronic pancreatitis. Spleen: Tiny hypodensities in the spleen are stable in the interval, too small to characterize but compatible with benign etiology. No followup imaging is recommended. Adrenals/Urinary Tract: No adrenal nodule or mass. 2 mm nonobstructing stone identified lower pole right kidney. Right ureter unremarkable. Despite the lack of secondary changes in the right kidney and ureter, a 1-2 mm stone is identified in the right UVJ (image 70/2). Given persistence, this may represent a mucosal calcification in the region of the UVJ. Mild to moderate left hydroureteronephrosis is associated with left perinephric stranding/edema. Left ureter remains dilated into the pelvis where a 4 x 5 x 3 mm distal left ureteral stone is identified, similar to prior. Urinary bladder otherwise unremarkable. Stomach/Bowel: Stomach is unremarkable. No gastric wall thickening. No evidence of outlet obstruction. Duodenum does not cross the midline as expected. Cecal tip is in the right lower quadrant as expected. The terminal ileum is normal. The appendix is normal. No evidence for small bowel obstruction. Diverticular changes are noted in the left  colon without evidence of diverticulitis. Vascular/Lymphatic: There is advanced atherosclerotic calcification of the abdominal aorta without aneurysm. There is no gastrohepatic or hepatoduodenal ligament lymphadenopathy. No retroperitoneal or mesenteric lymphadenopathy. No pelvic sidewall lymphadenopathy. Reproductive: The prostate gland and seminal vesicles are unremarkable. Other: No intraperitoneal free fluid. Musculoskeletal: Small left groin hernia contains only fat. Scattered tiny sclerotic bone lesions are stable in the interval. 19 mm sclerotic lesion anterior left iliac crest is stable in the interval, likely benign. IMPRESSION: 1. 4 x 5 x 3 mm distal left ureteral stone with mild to moderate left hydroureteronephrosis and left perinephric stranding/edema. This is similar to prior. Findings may reflect non migration of this distal left ureteral stone in the long interval since the prior study. Superinfection proximal to the ureteral stone cannot be excluded. 2. Despite the lack of secondary changes in the right kidney and ureter, a 1-2 mm stone is identified in the right UVJ region. Given persistence, this may represent non migration of the stone although a mucosal calcification in the region of the UVJ is also a consideration. 3. 2 mm nonobstructing stone lower pole right kidney. 4. Left colonic diverticulosis without diverticulitis. 5. Small left groin hernia contains only fat. 6.  Aortic Atherosclerois (ICD10-170.0) Electronically Signed   By: Kennith Center M.D.   On: 05/03/2023 08:48    I independently reviewed the above imaging studies.  Impression/Recommendation 89 year old male with bilateral ureteral stones with concomitant left-sided hydronephrosis, AKI and renal colic  The risks, benefits and alternatives of cystoscopy with bilateral ureteroscopy, laser lithotripsy and ureteral stent placement was discussed the patient.  Risks included, but are not limited to: bleeding, urinary tract  infection, ureteral injury/avulsion, ureteral stricture formation, retained stone fragments, the possibility that multiple surgeries may be required to treat the stone(s), MI, stroke, PE and the inherent risks of general anesthesia.  The patient voices understanding and wishes to proceed.      Timothy Moody, MD [facility] 05/03/2023, 11:28 AM

## 2023-05-03 NOTE — Anesthesia Procedure Notes (Signed)
 Procedure Name: LMA Insertion Date/Time: 05/03/2023 11:35 AM  Performed by: Oletha Cruel, CRNAPre-anesthesia Checklist: Emergency Drugs available, Suction available, Patient identified and Patient being monitored Patient Re-evaluated:Patient Re-evaluated prior to induction Oxygen Delivery Method: Circle system utilized Preoxygenation: Pre-oxygenation with 100% oxygen Induction Type: IV induction Ventilation: Mask ventilation without difficulty LMA: LMA inserted LMA Size: 4.0 Number of attempts: 1 Placement Confirmation: positive ETCO2, CO2 detector and breath sounds checked- equal and bilateral Tube secured with: Tape Dental Injury: Teeth and Oropharynx as per pre-operative assessment  Comments: Atraumatic insertion of LMA size 4. Lips and teeth remain in preoperative condition.

## 2023-05-03 NOTE — ED Notes (Signed)
 Pt left ED w/ [facility], en route to PACU

## 2023-05-03 NOTE — Progress Notes (Signed)
 Plan of Care Note for accepted transfer   Patient: Timothy Garrett MRN: [id number]   DOA: 05/03/2023  [facility] Requesting Provider: Ernie Avena, MD Reason for transfer: Obstructive uropathy, nephrolithiasis Facility course:   89 year old male with medical history significant for HTN, HLD, diverticulitis, IBS, nephrolithiasis who presents to the emergency department with left lower quadrant abdominal pain.  The patient was seen at urgent care on 04/28/2023 and was found to have blood in his urine.  He was treated for either possible diverticulitis or possible kidney stone.  He has been on Augmentin which was prescribed twice a day but he has been only taking it once a day.  His symptoms been ongoing for the last week.  He endorses nausea in addition to the abdominal pain.  He denies any genitourinary symptoms.  No fevers or chills.  He had been drinking 10 tramadol and Tylenol for pain control at home.  He presents with persistent symptoms of pain in the left lower quadrant that radiates to his flank.   Plan of care: The patient is accepted for admission to Med-surg  unit, at [facility]. Case discussed with Dr. Sande Brothers from urology who will see him in consult once at [facility].   Author: Bobette Mo, MD 05/03/2023  Check [email or web address] for on-call coverage.  Nursing staff, Please call [facility] Admits & Consults System-Wide number on Amion as soon as patient's arrival, so appropriate admitting provider can evaluate the pt.

## 2023-05-03 NOTE — Transfer of Care (Addendum)
 Immediate Anesthesia Transfer of Care Note  Patient: Timothy Garrett  Procedure(s) Performed: CYSTOSCOPY/URETEROSCOPY/HOLMIUM LASER/STENT PLACEMENT (Bilateral)  Patient Location: PACU  Anesthesia Type:General  Level of Consciousness: Drowsy, confused. Patient cooperative.  Airway & Oxygen Therapy: Patient Spontanous Breathing and Patient connected to face mask oxygen  Post-op Assessment: Report given to RN and Post -op Vital signs reviewed and stable  Post vital signs: Reviewed and stable  Last Vitals:  Vitals Value Taken Time  BP 147/95 05/03/23 1247  Temp 98.6 05/03/23   1247  Pulse 109 05/03/23 1248  Resp 18 05/03/23 1247  SpO2 92 % 05/03/23 1248  Vitals shown include unfiled device data.  Last Pain:  Vitals:   05/03/23 1027  TempSrc:   PainSc: 10-Worst pain ever         Complications: No notable events documented.

## 2023-05-03 NOTE — H&P (Signed)
 History and Physical    Patient: Timothy Garrett UEA:[id number] DOB: 07-09-1931 DOA: 05/03/2023 DOS: the patient was seen and examined on 05/03/2023 PCP: Kristian Covey, MD  Patient coming from: Home  Chief Complaint:  Chief Complaint  Patient presents with   Flank Pain   HPI: Timothy Garrett is a 89 y.o. male with medical history significant of osteoarthritis, diverticulosis/diverticulitis, GERD, hyperlipidemia, hypertension, IBS, nephrolithiasis who presented to the emergency department complaints of left flank pain associated with nausea since 5 days ago.  He was seen in urgent care and was treated for possible diverticulitis or nephrolithiasis.   No emesis, diarrhea, constipation, melena or hematochezia.  No dysuria, frequency or gross hematuria. He has been taking Augmentin once a day. He denied fever, chills, rhinorrhea, sore throat, wheezing or hemoptysis.  No chest pain, palpitations, diaphoresis, PND, orthopnea or pitting edema of the lower extremities.  No polyuria, polydipsia, polyphagia or blurred vision.   Lab work: Urinalysis with colors less with a specific gravity greater than 1.046 and trace hemoglobin.  CBC showed a white count of 14.0 with 83% neutrophils, hemoglobin 12.6 g/dL with an MCV of 478.2 fL and platelets 249.  Lipase was normal.  CMP showed a glucose of 28, BUN 27 and creatinine 1.17 mg/dL, the rest of the CMP measurements were unremarkable.  Imaging: CT abdomen/pelvis with contrast 4 x 5 x 3 mm left ureteral stone with mild to moderate left hydroureteronephrosis and left perinephric stranding.  Similar to prior.  There is a 2 mm nonobstructing stone in the lower pole of the right kidney.  Left colon diverticulosis without diverticulitis.  Small left groin hernia that only contains fat.  Aortic atherosclerosis.   ED course: Initial vital signs were temperature 98.2 F, pulse 78, respiration 19, BP 152/80 mmHg O2 sat 100% on room air.  The patient received  hydromorphone 0.5 mg IVP, hydromorphone 1 mg IVP, ondansetron 4 mg IVP and 1000 mL of normal saline bolus.  Review of Systems: As mentioned in the history of present illness. All other systems reviewed and are negative. Past Medical History:  Diagnosis Date   Arthritis    Diverticulitis    Diverticulitis    GERD (gastroesophageal reflux disease)    Heartburn   Hyperlipemia    Hypertension    IBS (irritable bowel syndrome)    Kidney stones    Past Surgical History:  Procedure Laterality Date   BACK SURGERY     EYE SURGERY  2016   cataract surgery   TONSILLECTOMY  1941   ulcer surgery     Social History:  reports that he has quit smoking. He has never used smokeless tobacco. He reports that he does not drink alcohol and does not use drugs.  Allergies  Allergen Reactions   Bactrim [Sulfamethoxazole-Trimethoprim] Itching    Family History  Problem Relation Age of Onset   Stomach cancer Mother    Hypertension Mother    Heart disease Father    Heart attack Father    Heart attack Sister    Stroke Maternal Grandfather    Heart attack Paternal Grandfather     Prior to Admission medications   Medication Sig Start Date End Date Taking? Authorizing Provider  acetaminophen (TYLENOL) 500 MG tablet Take 1,000 mg by mouth every 6 (six) hours as needed for moderate pain, fever or headache.    [provider]  albuterol (VENTOLIN HFA) 108 (90 Base) MCG/ACT inhaler Inhale 2 puffs into the lungs every 4 (four) hours  as needed for wheezing or shortness of breath. 10/17/22   Ghimire, Werner Lean, MD  atorvastatin (LIPITOR) 20 MG tablet TAKE 1/2 TABLET BY MOUTH AT BEDTIME 03/14/23   Burchette, Elberta Fortis, MD  Cholecalciferol (VITAMIN D-3 PO) Take 1 capsule by mouth daily.    [provider]  gabapentin (NEURONTIN) 100 MG capsule TAKE 2 CAPSULES BY MOUTH EVERY MORNING AND 3 CAPSULES AT BEDTIME 04/21/23   Burchette, Elberta Fortis, MD  levothyroxine (SYNTHROID) 50 MCG tablet TAKE 1 TABLET(50  MCG) BY MOUTH DAILY 02/27/23   Burchette, Elberta Fortis, MD  lisinopril (ZESTRIL) 20 MG tablet TAKE 1 TABLET(20 MG) BY MOUTH AT BEDTIME 04/11/23   Burchette, Elberta Fortis, MD  LORazepam (ATIVAN) 1 MG tablet TAKE 1/2 TO 1 TABLET(0.5 TO 1 MG) BY MOUTH AT BEDTIME 02/20/23   Burchette, Elberta Fortis, MD  Multiple Vitamins-Minerals (CENTRUM SILVER 50+MEN) TABS Take 1 tablet by mouth daily with breakfast.    [provider]  tamsulosin (FLOMAX) 0.4 MG CAPS capsule TAKE 1 CAPSULE BY MOUTH AT BEDTIME 04/09/23   Burchette, Elberta Fortis, MD  traMADol (ULTRAM) 50 MG tablet Take 1 tablet (50 mg total) by mouth every 6 (six) hours as needed. 04/28/23   Kristian Covey, MD    Physical Exam: Vitals:   05/03/23 1300 05/03/23 1311 05/03/23 1315 05/03/23 1325  BP: (!) 131/98  (!) 164/66   Pulse: (!) 32 93 96 85  Resp: (!) 28 19 (!) 23 14  Temp:      TempSrc:      SpO2: 95% 96% 98% 100%  Weight:      Height:       Physical Exam Vitals and nursing note reviewed.  Constitutional:      General: He is awake. He is not in acute distress.    Appearance: Normal appearance. He is ill-appearing.  HENT:     Head: Normocephalic.     Mouth/Throat:     Mouth: Mucous membranes are dry.  Eyes:     General: No scleral icterus.    Pupils: Pupils are equal, round, and reactive to light.  Neck:     Vascular: No JVD.  Cardiovascular:     Rate and Rhythm: Normal rate and regular rhythm.     Heart sounds: S1 normal and S2 normal.  Pulmonary:     Effort: Pulmonary effort is normal.     Breath sounds: Normal breath sounds. No wheezing, rhonchi or rales.  Abdominal:     General: Bowel sounds are normal. There is no distension.     Palpations: Abdomen is soft.     Tenderness: There is left CVA tenderness. There is no guarding.  Musculoskeletal:     Cervical back: Neck supple.     Right lower leg: No edema.     Left lower leg: No edema.  Skin:    General: Skin is warm and dry.  Neurological:     General: No focal deficit  present.     Mental Status: He is alert and oriented to person, place, and time.  Psychiatric:        Mood and Affect: Mood normal.        Behavior: Behavior normal. Behavior is cooperative.     Data Reviewed:  Results are pending, will review when available.  Assessment and Plan: Principal Problem:   Hydronephrosis of left kidney In the setting of:   Nephrolithiasis  Status post cystoscopy, bilateral uteroscopy and laser lithotripsy Observation/MedSurg. Continue IV fluids. Analgesics as needed.  Antiemetics as needed. Follow CBC, CMP in AM. Urology consult appreciated.  Active Problems:   AKI (acute kidney injury) (HCC)  Superimposed on:   CKD (chronic kidney disease) stage 3, GFR 30-59 ml/min (HCC) Continue IV fluids. Hold ARB/ACE. Avoid hypotension. Avoid nephrotoxins. Monitor intake and output. Monitor renal function electrolytes.    Hypothyroidism Continue levothyroxine 50 mcg p.o. daily.    Hyperlipidemia Continue atorvastatin 10 mg p.o. bedtime.    GERD Antiacid, H2 blocker or PPI as needed.    BPH (benign prostatic hyperplasia) Continue tamsulosin 0.4 mg p.o. daily.    Hypertension Holding ACE inhibitor. Will use hydralazine p.o. as needed.    Advance Care Planning:   Code Status: Full Code   Consults: Urology Earvin Hansen, MD).  Family Communication:   Severity of Illness: The appropriate patient status for this patient is INPATIENT. Inpatient status is judged to be reasonable and necessary in order to provide the required intensity of service to ensure the patient's safety. The patient's presenting symptoms, physical exam findings, and initial radiographic and laboratory data in the context of their chronic comorbidities is felt to place them at high risk for further clinical deterioration. Furthermore, it is not anticipated that the patient will be medically stable for discharge from the hospital within 2 midnights of admission.   * I  certify that at the point of admission it is my clinical judgment that the patient will require inpatient hospital care spanning beyond 2 midnights from the point of admission due to high intensity of service, high risk for further deterioration and high frequency of surveillance required.*  Author: Bobette Mo, MD 05/03/2023 1:31 PM  For on call review [email or web address].   This document was prepared using Dragon voice recognition software and may contain some unintended transcription errors.

## 2023-05-03 NOTE — Op Note (Signed)
 Operative Note  Preoperative diagnosis:  1.  5 mm left distal ureteral stone 2.  2 mm right distal ureteral stone  Postoperative diagnosis: 1.  5 mm left distal ureteral stone 2.  2 mm bladder stone 3.  No distal ureteral calculus was seen within the right ureter 4.  Thin membranous urethral stricture  Procedure(s): 1.  Cystoscopy with urethral dilation 2.  Bilateral ureteroscopy with holmium laser lithotripsy of his left distal ureteral stone and bilateral ureteral stent placement 3.  Bilateral retrograde pyelograms with intraoperative interpretation of fluoroscopic imaging  Surgeon: Rhoderick Moody, MD  Assistants:  None  Anesthesia:  General  Complications:  None  EBL: Less than 5 mL  Specimens: 1.  Left distal ureteral stone fragments and bladder stone  Drains/Catheters: 1.  Bilateral 6 French, 24 cm JJ stents without tethers 2.  18 French Foley catheter with 10 mL of sterile water in the balloon  Intraoperative findings:   Thin membranous urethral stricture with a 12 French aperture No intravesical abnormalities Solitary left collecting system with no filling defects or dilation involving the left ureter or left renal pelvis seen on retrograde pyelogram Right retrograde pyelogram revealed multiple areas of stenosis involving the distal aspects of the right ureter with no proximal ureteral or renal pelvic dilation or filling defects. Stenotic right ureter that would not allow advancement of the semirigid ureteroscope beyond the level of the iliac vessels with no evidence of luminal stone burden  Indication:  Timothy Garrett is a 89 y.o. male with severe left renal colic secondary to an obstructing left distal ureteral stone.  He has been consented for the above procedures, voices understanding wishes to proceed.  Description of procedure:  After informed consent was obtained, the patient was brought to the operating room and general LMA anesthesia was  administered. The patient was then placed in the dorsolithotomy position and prepped and draped in the usual sterile fashion. A timeout was performed. A 23 French rigid cystoscope was then inserted into the urethral meatus and advanced until a membranous urethral stricture was identified.  A Glidewire was then advanced through the aperture of the stricture and into the bladder, confirming wire placement via fluoroscopy.  Amplatz dilators were then used to dilate the urethral stricture starting at 89 French and advancing up to 22 [place], and 2 [place] increments.  The rigid cystoscope was then advanced into the bladder and a complete bladder survey revealed no intravesical mucosal abnormalities.  He was found to have a 2 mm bladder stone that was siphon out of the bladder through the sheath of the cystoscope.  A 5 French ureteral catheter was then inserted into the left ureteral orifice and a retrograde pyelogram was obtained, with the findings listed above.  A Glidewire was then used to intubate the lumen of the ureteral catheter and was advanced up to the left renal pelvis, under fluoroscopic guidance.  The catheter was then removed, leaving the wire in place.  The semirigid ureteroscope was then advanced into the distal aspects of the left ureter where an obstructing stone was identified.  A 200 m holmium laser was then used to fracture the stone into numerous smaller pieces.  A 0 tip basket was then used to extract all stone fragments from the lumen of the left ureter.  A 6 French, 24 cm JJ stent was then placed over the Glidewire and into good position within the left collecting system, confirming placement via fluoroscopy.  A 5 French ureteral catheter was  then inserted into the right ureteral orifice and a retrograde pyelogram was obtained, with the findings listed above.  A Glidewire was then used to intubate the lumen of the ureteral catheter and was advanced up to the right renal pelvis, under  fluoroscopic guidance.  The catheter was then removed, leaving the wire in place.  I then advanced a semirigid ureteroscope into the distal aspects of the right ureter, but it was found to be moderately stenotic.  I was only able to advance the scope up to the level of the iliac vessels due to resistance.  No luminal stone burden was seen within the right ureter.  I then removed the semirigid ureteroscope, leaving the wire in place.  A 6 French, 24 cm JJ stent was then advanced over the wire and into good position within the right collecting system, confirming placement via fluoroscopy.  The patient's bladder was drained and all stone fragments were removed.  An 69 French Foley catheter was then advanced into the bladder with return of slightly bloody irrigant.  10 mL sterile water was then used to inflate the catheter balloon, which was then hand irrigated and found to be free of clot/debris.  The Foley catheter was then placed to gravity drainage.  The patient tolerated the procedure well and was transferred to the postanesthesia in stable condition.  Plan: Follow-up in 1 week for Foley catheter and bilateral ureteral stent removal

## 2023-05-03 NOTE — ED Triage Notes (Signed)
 Pt POV from home c/o left flank pain. Urgent care visit last Monday said blood found in urine, suspected possible kidney stone. Denies painful urination but c/o urgency/frequency.

## 2023-05-03 NOTE — ED Provider Notes (Signed)
 [place] [facility] AT [facility] Provider Note   CSN: [id number] Arrival date & time: 05/03/23  0645     History  Chief Complaint  Patient presents with   Flank Pain    Arhaan Chesnut is a 89 y.o. male.   Flank Pain Associated symptoms include abdominal pain.     89 year old male with medical history significant for HTN, HLD, diverticulitis, IBS, nephrolithiasis who presents to the emergency department with left lower quadrant abdominal pain.  The patient was seen at urgent care on 04/28/2023 and was found to have blood in his urine.  He was treated for either possible diverticulitis or possible kidney stone.  He has been on Augmentin which was prescribed twice a day but he has been only taking it once a day.  His symptoms been ongoing for the last week.  He endorses nausea in addition to the abdominal pain.  He denies any genitourinary symptoms.  No fevers or chills.  He had been drinking 10 tramadol and Tylenol for pain control at home.  He presents with persistent symptoms of pain in the left lower quadrant that radiates to his flank.  Home Medications Prior to Admission medications   Medication Sig Start Date End Date Taking? Authorizing Provider  acetaminophen (TYLENOL) 500 MG tablet Take 1,000 mg by mouth every 6 (six) hours as needed for moderate pain, fever or headache.    [provider]  albuterol (VENTOLIN HFA) 108 (90 Base) MCG/ACT inhaler Inhale 2 puffs into the lungs every 4 (four) hours as needed for wheezing or shortness of breath. 10/17/22   Ghimire, Werner Lean, MD  atorvastatin (LIPITOR) 20 MG tablet TAKE 1/2 TABLET BY MOUTH AT BEDTIME 03/14/23   Burchette, Elberta Fortis, MD  Cholecalciferol (VITAMIN D-3 PO) Take 1 capsule by mouth daily.    [provider]  gabapentin (NEURONTIN) 100 MG capsule TAKE 2 CAPSULES BY MOUTH EVERY MORNING AND 3 CAPSULES AT BEDTIME 04/21/23   Burchette, Elberta Fortis, MD  levothyroxine (SYNTHROID) 50 MCG tablet  TAKE 1 TABLET(50 MCG) BY MOUTH DAILY 02/27/23   Burchette, Elberta Fortis, MD  lisinopril (ZESTRIL) 20 MG tablet TAKE 1 TABLET(20 MG) BY MOUTH AT BEDTIME 04/11/23   Burchette, Elberta Fortis, MD  LORazepam (ATIVAN) 1 MG tablet TAKE 1/2 TO 1 TABLET(0.5 TO 1 MG) BY MOUTH AT BEDTIME 02/20/23   Burchette, Elberta Fortis, MD  Multiple Vitamins-Minerals (CENTRUM SILVER 50+MEN) TABS Take 1 tablet by mouth daily with breakfast.    [provider]  tamsulosin (FLOMAX) 0.4 MG CAPS capsule TAKE 1 CAPSULE BY MOUTH AT BEDTIME 04/09/23   Burchette, Elberta Fortis, MD  traMADol (ULTRAM) 50 MG tablet Take 1 tablet (50 mg total) by mouth every 6 (six) hours as needed. 04/28/23   Burchette, Elberta Fortis, MD      Allergies    Bactrim [sulfamethoxazole-trimethoprim]    Review of Systems   Review of Systems  Gastrointestinal:  Positive for abdominal pain and nausea.  Genitourinary:  Positive for flank pain.  All other systems reviewed and are negative.   Physical Exam Updated Vital Signs BP (!) 152/80 (BP Location: Right Arm)   Pulse 78   Temp 98.2 F (36.8 C) (Oral)   Resp 19   Ht 5\' 9"  (1.753 m)   Wt 59 kg   SpO2 100%   BMI 19.20 kg/m  Physical Exam Vitals and nursing note reviewed.  Constitutional:      General: He is not in acute distress.  Appearance: He is well-developed.  HENT:     Head: Normocephalic and atraumatic.  Eyes:     Conjunctiva/sclera: Conjunctivae normal.  Cardiovascular:     Rate and Rhythm: Normal rate and regular rhythm.  Pulmonary:     Effort: Pulmonary effort is normal. No respiratory distress.     Breath sounds: Normal breath sounds.  Abdominal:     Palpations: Abdomen is soft.     Tenderness: There is abdominal tenderness in the left lower quadrant. There is no left CVA tenderness, guarding or rebound.  Musculoskeletal:        General: No swelling.     Cervical back: Neck supple.  Skin:    General: Skin is warm and dry.     Capillary Refill: Capillary refill takes less than 2 seconds.   Neurological:     Mental Status: He is alert.  Psychiatric:        Mood and Affect: Mood normal.     ED Results / Procedures / Treatments   Labs (all labs ordered are listed, but only abnormal results are displayed) Labs Reviewed  COMPREHENSIVE METABOLIC PANEL - Abnormal; Notable for the following components:      Result Value   Glucose, Bld 128 (*)    BUN 27 (*)    Creatinine, Ser 1.70 (*)    AST 14 (*)    GFR, Estimated 38 (*)    All other components within normal limits  CBC WITH DIFFERENTIAL/PLATELET - Abnormal; Notable for the following components:   WBC 14.0 (*)    RBC 3.69 (*)    Hemoglobin 12.6 (*)    HCT 38.8 (*)    MCV 105.1 (*)    MCH 34.1 (*)    Neutro Abs 11.6 (*)    Monocytes Absolute 1.4 (*)    All other components within normal limits  URINALYSIS, ROUTINE W REFLEX MICROSCOPIC - Abnormal; Notable for the following components:   Color, Urine COLORLESS (*)    Specific Gravity, Urine >1.046 (*)    Hgb urine dipstick TRACE (*)    All other components within normal limits  LIPASE, BLOOD    EKG None  Radiology CT ABDOMEN PELVIS W CONTRAST Result Date: 05/03/2023 CLINICAL DATA:  Left flank pain with hematuria. EXAM: CT ABDOMEN AND PELVIS WITH CONTRAST TECHNIQUE: Multidetector CT imaging of the abdomen and pelvis was performed using the standard protocol following bolus administration of intravenous contrast. RADIATION DOSE REDUCTION: This exam was performed according to the departmental dose-optimization program which includes automated exposure control, adjustment of the mA and/or kV according to patient size and/or use of iterative reconstruction technique. CONTRAST:  80mL OMNIPAQUE IOHEXOL 300 MG/ML  SOLN COMPARISON:  03/02/2021 FINDINGS: Lower chest: Unremarkable. Hepatobiliary: No suspicious focal abnormality within the liver parenchyma. There is no evidence for gallstones, gallbladder wall thickening, or pericholecystic fluid. No intrahepatic or extrahepatic  biliary dilation. Pancreas: No focal mass lesion. No dilatation of the main duct. No intraparenchymal cyst. No peripancreatic edema. Scattered pancreatic parenchymal calcification suggests a history of chronic pancreatitis. Spleen: Tiny hypodensities in the spleen are stable in the interval, too small to characterize but compatible with benign etiology. No followup imaging is recommended. Adrenals/Urinary Tract: No adrenal nodule or mass. 2 mm nonobstructing stone identified lower pole right kidney. Right ureter unremarkable. Despite the lack of secondary changes in the right kidney and ureter, a 1-2 mm stone is identified in the right UVJ (image 70/2). Given persistence, this may represent a mucosal calcification in the region of  the UVJ. Mild to moderate left hydroureteronephrosis is associated with left perinephric stranding/edema. Left ureter remains dilated into the pelvis where a 4 x 5 x 3 mm distal left ureteral stone is identified, similar to prior. Urinary bladder otherwise unremarkable. Stomach/Bowel: Stomach is unremarkable. No gastric wall thickening. No evidence of outlet obstruction. Duodenum does not cross the midline as expected. Cecal tip is in the right lower quadrant as expected. The terminal ileum is normal. The appendix is normal. No evidence for small bowel obstruction. Diverticular changes are noted in the left colon without evidence of diverticulitis. Vascular/Lymphatic: There is advanced atherosclerotic calcification of the abdominal aorta without aneurysm. There is no gastrohepatic or hepatoduodenal ligament lymphadenopathy. No retroperitoneal or mesenteric lymphadenopathy. No pelvic sidewall lymphadenopathy. Reproductive: The prostate gland and seminal vesicles are unremarkable. Other: No intraperitoneal free fluid. Musculoskeletal: Small left groin hernia contains only fat. Scattered tiny sclerotic bone lesions are stable in the interval. 19 mm sclerotic lesion anterior left iliac crest  is stable in the interval, likely benign. IMPRESSION: 1. 4 x 5 x 3 mm distal left ureteral stone with mild to moderate left hydroureteronephrosis and left perinephric stranding/edema. This is similar to prior. Findings may reflect non migration of this distal left ureteral stone in the long interval since the prior study. Superinfection proximal to the ureteral stone cannot be excluded. 2. Despite the lack of secondary changes in the right kidney and ureter, a 1-2 mm stone is identified in the right UVJ region. Given persistence, this may represent non migration of the stone although a mucosal calcification in the region of the UVJ is also a consideration. 3. 2 mm nonobstructing stone lower pole right kidney. 4. Left colonic diverticulosis without diverticulitis. 5. Small left groin hernia contains only fat. 6.  Aortic Atherosclerois (ICD10-170.0) Electronically Signed   By: Kennith Center M.D.   On: 05/03/2023 08:48    Procedures Procedures    Medications Ordered in ED Medications  0.9 %  sodium chloride infusion (has no administration in time range)  fentaNYL (SUBLIMAZE) injection 50 mcg (50 mcg Intravenous Given 05/03/23 0734)  ondansetron (ZOFRAN) injection 4 mg (4 mg Intravenous Given 05/03/23 0734)  iohexol (OMNIPAQUE) 300 MG/ML solution 80 mL (80 mLs Intravenous Contrast Given 05/03/23 0813)  sodium chloride 0.9 % bolus 1,000 mL (0 mLs Intravenous Stopped 05/03/23 0952)  HYDROmorphone (DILAUDID) injection 1 mg (1 mg Intravenous Given 05/03/23 4696)    ED Course/ Medical Decision Making/ A&P Clinical Course as of 05/03/23 0952  Sat May 03, 2023  0750 WBC(!): 14.0 [JL]  0924 Creatinine(!): 1.70 [JL]  0924 BUN(!): 27 [JL]    Clinical Course User Index [JL] Ernie Avena, MD                                 Medical Decision Making Amount and/or Complexity of Data Reviewed Labs: ordered. Decision-making details documented in ED Course. Radiology: ordered.  Risk Prescription drug  management. Decision regarding hospitalization.    89 year old male with medical history significant for HTN, HLD, diverticulitis, IBS, nephrolithiasis who presents to the emergency department with left lower quadrant abdominal pain.  The patient was seen at urgent care on 04/28/2023 and was found to have blood in his urine.  He was treated for either possible diverticulitis or possible kidney stone.  He has been on Augmentin which was prescribed twice a day but he has been only taking it once a day.  His  symptoms been ongoing for the last week.  He endorses nausea in addition to the abdominal pain.  He denies any genitourinary symptoms.  No fevers or chills.  He had been drinking 10 tramadol and Tylenol for pain control at home.  He presents with persistent symptoms of pain in the left lower quadrant that radiates to his flank.  On arrival, the patient was afebrile, not tachycardic or tachypneic, BP 152/80, saturating high percent on room air.  Physical exam revealed left lower quadrant tenderness to palpation, no rebound or guarding, no CVA tenderness.  Reviewed and confirmed nursing documentation for past medical history, family history, social history.    Initial Assessment:   With the patient's presentation of abdominal pain, most likely diagnosis is diverticulitis vs nephrolithiasis. Other diagnoses were considered including (but not limited to) gastroenteritis, colitis, small bowel obstruction, appendicitis, cholecystitis, pancreatitis, UTI, pyelonephritis, malignancy. These are considered less likely due to history of present illness and physical exam findings.      Initial Plan:  CBC/CMP to evaluate for underlying infectious/metabolic etiology for patient's abdominal pain  Lipase to evaluate for pancreatitis  EKG to evaluate for cardiac source of pain  CTAB/Pelvis with contrast to evaluate for structural/surgical etiology of patients' severe abdominal pain.  Urinalysis and repeat physical  assessment to evaluate for UTI/Pyelonpehritis  Empiric management of symptoms with escalating pain control and antiemetics as needed.   Initial Study Results:   Laboratory  All laboratory results reviewed without evidence of clinically relevant pathology.   Exceptions include: Leukocytosis to 14, AKI with a creatinine of 1.7 from baseline of 1-1.2. UA with hematuria, neg for UTI.   Radiology All images reviewed independently. Agree with radiology report at this time.   CT ABDOMEN PELVIS W CONTRAST Result Date: 05/03/2023 CLINICAL DATA:  Left flank pain with hematuria. EXAM: CT ABDOMEN AND PELVIS WITH CONTRAST TECHNIQUE: Multidetector CT imaging of the abdomen and pelvis was performed using the standard protocol following bolus administration of intravenous contrast. RADIATION DOSE REDUCTION: This exam was performed according to the departmental dose-optimization program which includes automated exposure control, adjustment of the mA and/or kV according to patient size and/or use of iterative reconstruction technique. CONTRAST:  80mL OMNIPAQUE IOHEXOL 300 MG/ML  SOLN COMPARISON:  03/02/2021 FINDINGS: Lower chest: Unremarkable. Hepatobiliary: No suspicious focal abnormality within the liver parenchyma. There is no evidence for gallstones, gallbladder wall thickening, or pericholecystic fluid. No intrahepatic or extrahepatic biliary dilation. Pancreas: No focal mass lesion. No dilatation of the main duct. No intraparenchymal cyst. No peripancreatic edema. Scattered pancreatic parenchymal calcification suggests a history of chronic pancreatitis. Spleen: Tiny hypodensities in the spleen are stable in the interval, too small to characterize but compatible with benign etiology. No followup imaging is recommended. Adrenals/Urinary Tract: No adrenal nodule or mass. 2 mm nonobstructing stone identified lower pole right kidney. Right ureter unremarkable. Despite the lack of secondary changes in the right kidney and  ureter, a 1-2 mm stone is identified in the right UVJ (image 70/2). Given persistence, this may represent a mucosal calcification in the region of the UVJ. Mild to moderate left hydroureteronephrosis is associated with left perinephric stranding/edema. Left ureter remains dilated into the pelvis where a 4 x 5 x 3 mm distal left ureteral stone is identified, similar to prior. Urinary bladder otherwise unremarkable. Stomach/Bowel: Stomach is unremarkable. No gastric wall thickening. No evidence of outlet obstruction. Duodenum does not cross the midline as expected. Cecal tip is in the right lower quadrant as expected. The terminal ileum  is normal. The appendix is normal. No evidence for small bowel obstruction. Diverticular changes are noted in the left colon without evidence of diverticulitis. Vascular/Lymphatic: There is advanced atherosclerotic calcification of the abdominal aorta without aneurysm. There is no gastrohepatic or hepatoduodenal ligament lymphadenopathy. No retroperitoneal or mesenteric lymphadenopathy. No pelvic sidewall lymphadenopathy. Reproductive: The prostate gland and seminal vesicles are unremarkable. Other: No intraperitoneal free fluid. Musculoskeletal: Small left groin hernia contains only fat. Scattered tiny sclerotic bone lesions are stable in the interval. 19 mm sclerotic lesion anterior left iliac crest is stable in the interval, likely benign. IMPRESSION: 1. 4 x 5 x 3 mm distal left ureteral stone with mild to moderate left hydroureteronephrosis and left perinephric stranding/edema. This is similar to prior. Findings may reflect non migration of this distal left ureteral stone in the long interval since the prior study. Superinfection proximal to the ureteral stone cannot be excluded. 2. Despite the lack of secondary changes in the right kidney and ureter, a 1-2 mm stone is identified in the right UVJ region. Given persistence, this may represent non migration of the stone although a  mucosal calcification in the region of the UVJ is also a consideration. 3. 2 mm nonobstructing stone lower pole right kidney. 4. Left colonic diverticulosis without diverticulitis. 5. Small left groin hernia contains only fat. 6.  Aortic Atherosclerois (ICD10-170.0) Electronically Signed   By: Kennith Center M.D.   On: 05/03/2023 08:48     Consults: Case discussed with urology, Dr. Liliane Shi, plan for pain control, and if better on reassessment, follow-up on Monday. If urine nitrite positive, or pain not controlled, then will transfer to [facility] for management  Final Reassessment and Plan:   Pt with persistent issues with pain control after of Fentanyl. Administered 1mg  Dilaudid.  Spoke with Dr. Liliane Shi who recommended hospitalist admission, will plan for operative management.  Hospitalist medicine consulted for admission, Dr. Robb Matar accepting. Pt and family updated regarding the plan of care.   Final Clinical Impression(s) / ED Diagnoses Final diagnoses:  AKI (acute kidney injury) (HCC)  Ureterolithiasis    Rx / DC Orders ED Discharge Orders     None         Ernie Avena, MD 05/03/23 [phone]

## 2023-05-03 NOTE — ED Notes (Signed)
 Attempted to verify PACU ready to receive patient via telephone; no answer. Will have to call back.

## 2023-05-03 NOTE — Anesthesia Preprocedure Evaluation (Addendum)
 Anesthesia Evaluation  Patient identified by MRN, date of birth, ID band Patient awake    Reviewed: Allergy & Precautions, NPO status , Patient's Chart, lab work & pertinent test results  Airway Mallampati: II  TM Distance: >3 FB Neck ROM: Full    Dental  (+) Chipped, Missing, Dental Advisory Given   Pulmonary former smoker   Pulmonary exam normal breath sounds clear to auscultation       Cardiovascular hypertension, Pt. on medications Normal cardiovascular exam Rhythm:Regular Rate:Normal     Neuro/Psych negative neurological ROS  negative psych ROS   GI/Hepatic Neg liver ROS,GERD  ,,  Endo/Other  Hypothyroidism    Renal/GU Renal InsufficiencyRenal diseaseLab Results      Component                Value               Date                      NA                       135                 05/03/2023                CL                       103                 05/03/2023                K                        4.5                 05/03/2023                CO2                      24                  05/03/2023                BUN                      27 (H)              05/03/2023                CREATININE               1.70 (H)            05/03/2023                GFRNONAA                 38 (L)              05/03/2023                CALCIUM                  9.1                 05/03/2023                ALBUMIN  4.1                 05/03/2023                GLUCOSE                  128 (H)             05/03/2023             negative genitourinary   Musculoskeletal negative musculoskeletal ROS (+)    Abdominal   Peds  Hematology negative hematology ROS (+)   Anesthesia Other Findings   Reproductive/Obstetrics                             Anesthesia Physical Anesthesia Plan  ASA: 3 and emergent  Anesthesia Plan: General   Post-op Pain Management: Ofirmev IV (intra-op)*    Induction: Intravenous  PONV Risk Score and Plan: 2 and Ondansetron and Treatment may vary due to age or medical condition  Airway Management Planned: LMA  Additional Equipment:   Intra-op Plan:   Post-operative Plan: Extubation in OR  Informed Consent: I have reviewed the patients History and Physical, chart, labs and discussed the procedure including the risks, benefits and alternatives for the proposed anesthesia with the patient or authorized representative who has indicated his/her understanding and acceptance.     Dental advisory given  Plan Discussed with: CRNA  Anesthesia Plan Comments:        Anesthesia Quick Evaluation

## 2023-05-04 DIAGNOSIS — N1330 Unspecified hydronephrosis: Secondary | ICD-10-CM | POA: Diagnosis not present

## 2023-05-04 LAB — CBC
HCT: 36 % — ABNORMAL LOW (ref 39.0–52.0)
Hemoglobin: 11.8 g/dL — ABNORMAL LOW (ref 13.0–17.0)
MCH: 34.9 pg — ABNORMAL HIGH (ref 26.0–34.0)
MCHC: 32.8 g/dL (ref 30.0–36.0)
MCV: 106.5 fL — ABNORMAL HIGH (ref 80.0–100.0)
Platelets: 245 10*3/uL (ref 150–400)
RBC: 3.38 MIL/uL — ABNORMAL LOW (ref 4.22–5.81)
RDW: 13.2 % (ref 11.5–15.5)
WBC: 13.5 10*3/uL — ABNORMAL HIGH (ref 4.0–10.5)
nRBC: 0 % (ref 0.0–0.2)

## 2023-05-04 LAB — COMPREHENSIVE METABOLIC PANEL
ALT: 12 U/L (ref 0–44)
AST: 13 U/L — ABNORMAL LOW (ref 15–41)
Albumin: 3.1 g/dL — ABNORMAL LOW (ref 3.5–5.0)
Alkaline Phosphatase: 42 U/L (ref 38–126)
Anion gap: 8 (ref 5–15)
BUN: 27 mg/dL — ABNORMAL HIGH (ref 8–23)
CO2: 21 mmol/L — ABNORMAL LOW (ref 22–32)
Calcium: 8.8 mg/dL — ABNORMAL LOW (ref 8.9–10.3)
Chloride: 110 mmol/L (ref 98–111)
Creatinine, Ser: 1.58 mg/dL — ABNORMAL HIGH (ref 0.61–1.24)
GFR, Estimated: 42 mL/min — ABNORMAL LOW (ref >60)
Glucose, Bld: 118 mg/dL — ABNORMAL HIGH (ref 70–99)
Potassium: 4.5 mmol/L (ref 3.5–5.1)
Sodium: 139 mmol/L (ref 135–145)
Total Bilirubin: 0.5 mg/dL (ref 0.0–1.2)
Total Protein: 5.6 g/dL — ABNORMAL LOW (ref 6.5–8.1)

## 2023-05-04 MED ORDER — ACETAMINOPHEN 325 MG PO TABS: 650.0000 mg | ORAL_TABLET | Freq: Four times a day (QID) | ORAL | Status: AC | PRN

## 2023-05-04 MED ORDER — PHENAZOPYRIDINE HCL 100 MG PO TABS: 100.0000 mg | ORAL_TABLET | Freq: Three times a day (TID) | ORAL | 0 refills | Status: AC | PRN

## 2023-05-04 MED ORDER — CHLORHEXIDINE GLUCONATE CLOTH 2 % EX PADS
6.0000 | MEDICATED_PAD | Freq: Every day | CUTANEOUS | Status: DC
  Administered 2023-05-04: 6 via TOPICAL

## 2023-05-04 MED ORDER — ONDANSETRON HCL 4 MG PO TABS: 4.0000 mg | ORAL_TABLET | Freq: Four times a day (QID) | ORAL | 0 refills | Status: AC | PRN

## 2023-05-04 NOTE — TOC Initial Note (Signed)
 Transition of Care [facility]) - Initial/Assessment Note    Patient Details  Name: Timothy Garrett MRN: [id number] Date of Birth: [birth year]  Transition of Care [facility]) CM/SW Contact:    Timothy Prows, RN Phone Number: 05/04/2023, 11:49 AM  Clinical Narrative:                 Timothy Garrett w/ pt and dtr Timothy Garrett in room; pt says he lives at home w/ his wife; he plans to return at d/c; he identified POCs Timothy Garrett (dtr) [phone] / Timothy Garrett (wife) [phone]; his dtr will provide transportation; pt verified insurance/PCP; he denied SDOH risk; pt says he has cane, walker, and shower chair; he does not have HH services, or home oxygen; TOC will follow.  Expected Discharge Plan: Home/Self Care Barriers to Discharge: Continued Medical Work up   Patient Goals and CMS Choice Patient states their goals for this hospitalization and ongoing recovery are:: home CMS Medicare.gov Compare Post Acute Care list provided to:: Patient        Expected Discharge Plan and Services   Discharge Planning Services: CM Consult   Living arrangements for the past 2 months: Single Family Home                                      Prior Living Arrangements/Services Living arrangements for the past 2 months: Single Family Home Lives with:: Spouse Patient language and need for interpreter reviewed:: Yes Do you feel safe going back to the place where you live?: Yes      Need for Family Participation in Patient Care: Yes (Comment) Care giver support system in place?: Yes (comment) Current home services: DME (cane, walker, shower chair) Criminal Activity/Legal Involvement Pertinent to Current Situation/Hospitalization: No - Comment as needed  Activities of Daily Living   ADL Screening (condition at time of admission) Independently performs ADLs?: Yes (appropriate for developmental age) Is the patient deaf or have difficulty hearing?: No Does the patient have difficulty seeing, even when  wearing glasses/contacts?: No Does the patient have difficulty concentrating, remembering, or making decisions?: No  Permission Sought/Granted Permission sought to share information with : Case Manager Permission granted to share information with : Yes, Verbal Permission Granted  Share Information with NAME: Case Manager     Permission granted to share info w Relationship: Timothy Garrett (dtr) [phone] / Timothy Garrett (wife) [phone]     Emotional Assessment Appearance:: Appears stated age Attitude/Demeanor/Rapport: Gracious Affect (typically observed): Accepting Orientation: : Oriented to Self, Oriented to Place, Oriented to  Time, Oriented to Situation Alcohol / Substance Use: Not Applicable Psych Involvement: No (comment)  Admission diagnosis:  Ureterolithiasis [N20.1] Hydronephrosis of left kidney [N13.30] AKI (acute kidney injury) (HCC) [N17.9] Patient Active Problem List   Diagnosis Date Noted   Hydronephrosis of left kidney 05/03/2023   Nephrolithiasis 05/03/2023   AKI (acute kidney injury) (HCC) 05/03/2023   Pneumonia due to COVID-19 virus 10/16/2022   Acute respiratory failure with hypoxia (HCC) 10/15/2022   CKD (chronic kidney disease) stage 3, GFR 30-59 ml/min (HCC) 08/25/2018   Hypertension 08/25/2018   BPH (benign prostatic hyperplasia) 02/24/2018   NECK PAIN 07/17/2009   CONTUSION OF MULTIPLE SITES NEC 07/17/2009   LACTOSE INTOLERANCE 06/14/2009   INTERNAL HEMORRHOIDS 06/14/2009   GERD 06/14/2009   DIVERTICULITIS, COLON 06/14/2009   IRRITABLE BOWEL SYNDROME 06/14/2009   PUD, HX OF 06/14/2009   History of colonic polyps 06/14/2009  NEPHROLITHIASIS, HX OF 06/14/2009   TRAUMATIC ARTHROPATHY SHOULDER REGION 12/06/2008   SPINAL STENOSIS, LUMBAR 12/06/2008   UNS ADVRS EFF UNS RX MEDICINAL&BIOLOGICAL SBSTNC 05/06/2007   NEOP, MALIGNANT, SKIN, FACE NEC 11/21/2006   Malignant neoplasm of skin of face 11/21/2006   Hypothyroidism 08/01/2006   Hyperlipidemia  08/01/2006   Diverticulosis of colon 08/01/2006   PCP:  Kristian Covey, MD Pharmacy:   [facility] [id number] - [place], [place] - [address] AT [facility] OF [address] & [address] [place] [address] Phone: [phone] Fax: [phone]     Social Drivers of Health (SDOH) Social History: SDOH Screenings   Food Insecurity: No Food Insecurity (05/04/2023)  Housing: Low Risk  (05/04/2023)  Transportation Needs: No Transportation Needs (05/04/2023)  Utilities: Not At Risk (05/04/2023)  Alcohol Screen: Low Risk  (07/24/2022)  Depression (PHQ2-9): Low Risk  (07/24/2022)  Financial Resource Strain: Low Risk  (07/24/2022)  Physical Activity: Inactive (07/24/2022)  Social Connections: Moderately Isolated (05/03/2023)  Stress: No Stress Concern Present (07/24/2022)  Tobacco Use: Medium Risk (05/03/2023)   SDOH Interventions: Food Insecurity Interventions: Intervention Not Indicated, Inpatient TOC Housing Interventions: Intervention Not Indicated, Inpatient TOC Transportation Interventions: Intervention Not Indicated, Inpatient TOC Utilities Interventions: Intervention Not Indicated, Inpatient TOC   Readmission Risk Interventions     No data to display

## 2023-05-04 NOTE — Care Management CC44 (Signed)
 Condition Code 44 Documentation Completed  Patient Details  Name: Timothy Garrett MRN: [id number] Date of Birth: 1931-08-18   Condition Code 44 given:  Yes Patient signature on Condition Code 44 notice:  Yes Documentation of 2 MD's agreement:  Yes Code 44 added to claim:  Yes    Adrian Prows, RN 05/04/2023, 2:19 PM

## 2023-05-04 NOTE — Care Management Obs Status (Signed)
 MEDICARE OBSERVATION STATUS NOTIFICATION   Patient Details  Name: Lumir Demetriou MRN: [id number] Date of Birth: 05/12/1931   Medicare Observation Status Notification Given:  Yes    Adrian Prows, RN 05/04/2023, 2:19 PM

## 2023-05-04 NOTE — TOC Transition Note (Signed)
 Transition of Care [facility]) - Discharge Note   Patient Details  Name: Timothy Garrett MRN: [id number] Date of Birth: 12/26/1931  Transition of Care [facility] CM/SW Contact:  Adrian Prows, RN Phone Number: 05/04/2023, 2:20 PM   Clinical Narrative:    D/C orders received; no TOC needs   Final next level of care: Home/Self Care Barriers to Discharge: No Barriers Identified   Patient Goals and CMS Choice Patient states their goals for this hospitalization and ongoing recovery are:: home CMS Medicare.gov Compare Post Acute Care list provided to:: Patient        Discharge Placement                       Discharge Plan and Services Additional resources added to the After Visit Summary for     Discharge Planning Services: CM Consult            DME Arranged: N/A DME Agency: NA       HH Arranged: NA HH Agency: NA        Social Drivers of Health (SDOH) Interventions SDOH Screenings   Food Insecurity: No Food Insecurity (05/04/2023)  Housing: Low Risk  (05/04/2023)  Transportation Needs: No Transportation Needs (05/04/2023)  Utilities: Not At Risk (05/04/2023)  Alcohol Screen: Low Risk  (07/24/2022)  Depression (PHQ2-9): Low Risk  (07/24/2022)  Financial Resource Strain: Low Risk  (07/24/2022)  Physical Activity: Inactive (07/24/2022)  Social Connections: Moderately Isolated (05/03/2023)  Stress: No Stress Concern Present (07/24/2022)  Tobacco Use: Medium Risk (05/03/2023)     Readmission Risk Interventions     No data to display

## 2023-05-04 NOTE — Discharge Summary (Signed)
 Physician Discharge Summary  Timothy Garrett QVZ:[id number] DOB: [birth year] DOA: 05/03/2023  PCP: Kristian Covey, MD  Admit date: 05/03/2023 Discharge date: 05/04/2023  Admitted From: Home Disposition: Home Recommendations for Outpatient Follow-up:  Follow up with PCP in 1-2 weeks Please obtain BMP/CBC in one week Please follow up with urology in 1 week for removal of stent and Foley catheter  Home Health: None Equipment/Devices: None  Discharge Condition: Stable CODE STATUS: Full code Diet recommendation: Cardiac Brief/Interim Summary:   89 y.o. male with medical history significant of osteoarthritis, diverticulosis/diverticulitis, GERD, hyperlipidemia, hypertension, IBS, nephrolithiasis who presented to the emergency department complaints of left flank pain associated with nausea since 5 days ago.  He was seen in urgent care and was treated for possible diverticulitis or nephrolithiasis.   No emesis, diarrhea, constipation, melena or hematochezia.  No dysuria, frequency or gross hematuria. He has been taking Augmentin once a day. He denied fever, chills, rhinorrhea, sore throat, wheezing or hemoptysis.  No chest pain, palpitations, diaphoresis, PND, orthopnea or pitting edema of the lower extremities.  No polyuria, polydipsia, polyphagia or blurred vision.    Lab work: Urinalysis with colors less with a specific gravity greater than 1.046 and trace hemoglobin.  CBC showed a white count of 14.0 with 83% neutrophils, hemoglobin 12.6 g/dL with an MCV of 329.5 fL and platelets 249.  Lipase was normal.  CMP showed a glucose of 28, BUN 27 and creatinine 1.17 mg/dL, the rest of the CMP measurements were unremarkable.   Imaging: CT abdomen/pelvis with contrast 4 x 5 x 3 mm left ureteral stone with mild to moderate left hydroureteronephrosis and left perinephric stranding.  Similar to prior.  There is a 2 mm nonobstructing stone in the lower pole of the right kidney.  Left colon diverticulosis  without diverticulitis.  Small left groin hernia that only contains fat.  Aortic atherosclerosis.   ED course: Initial vital signs were temperature 98.2 F, pulse 78, respiration 19, BP 152/80 mmHg O2 sat 100% on room air.  The patient received hydromorphone 0.5 mg IVP, hydromorphone 1 mg IVP, ondansetron 4 mg IVP and 1000 mL of normal saline bolus.    Discharge Diagnoses:  Principal Problem:   Hydronephrosis of left kidney Active Problems:   Hypothyroidism   Hyperlipidemia   GERD   BPH (benign prostatic hyperplasia)   CKD (chronic kidney disease) stage 3, GFR 30-59 ml/min (HCC)   Hypertension   Nephrolithiasis   AKI (acute kidney injury) (HCC)     Hydronephrosis of left kidney in the setting of kidney stones.  He is status post cystoscopy with urethral dilatation, bilateral ureteroscopy with laser lithotripsy of left distal ureteral stone and bilateral ureteral stent placement, bilateral retrograde pyelogram with intraoperative interpretation of fluoroscopic imaging, 5 mm left distal ureteral stone 2 mm bladder stone.  He has been afebrile overnight anxious to go home.  Patient will be discharged home for to follow-up with urology in 1 week.  He already has a prescription for Augmentin at home which he needs to continue till 28 March.  Follow-up with urology in 1 week for removal of the Foley and stent.    AKI (acute kidney injury) (HCC)  Superimposed on:   CKD (chronic kidney disease) stage 3, GFR 30-59 ml/min (HCC) resolved  with IV fluids.     Hypothyroidism Continue levothyroxine 50 mcg p.o. daily.     Hyperlipidemia Continue atorvastatin 10 mg p.o. bedtime.     GERD continue home meds   BPH (benign  prostatic hyperplasia) Continue tamsulosin 0.4 mg p.o. daily.     Hypertension-blood pressure was soft during the hospital stay  lisinopril was stopped on discharge due to soft BP.  Estimated body mass index is 19.2 kg/m as calculated from the following:   Height as of this  encounter: 5\' 9"  (1.753 m).   Weight as of this encounter: 59 kg.  Discharge Instructions  Discharge Instructions     Diet - low sodium heart healthy   Complete by: As directed    Diet - low sodium heart healthy   Complete by: As directed    Increase activity slowly   Complete by: As directed    Increase activity slowly   Complete by: As directed       Allergies as of 05/04/2023       Reactions   Bactrim [sulfamethoxazole-trimethoprim] Itching   Milk Protein Diarrhea        Medication List     TAKE these medications    acetaminophen 500 MG tablet Commonly known as: TYLENOL Take 1,000 mg by mouth every 6 (six) hours as needed for moderate pain, fever or headache. What changed: Another medication with the same name was added. Make sure you understand how and when to take each.   acetaminophen 325 MG tablet Commonly known as: TYLENOL Take 2 tablets (650 mg total) by mouth every 6 (six) hours as needed for mild pain (pain score 1-3) (or Fever >/= 101). What changed: You were already taking a medication with the same name, and this prescription was added. Make sure you understand how and when to take each.   albuterol 108 (90 Base) MCG/ACT inhaler Commonly known as: VENTOLIN HFA Inhale 2 puffs into the lungs every 4 (four) hours as needed for wheezing or shortness of breath.   amoxicillin-clavulanate 875-125 MG tablet Commonly known as: AUGMENTIN Take 1 tablet by mouth 2 (two) times daily.   atorvastatin 20 MG tablet Commonly known as: LIPITOR TAKE 1/2 TABLET BY MOUTH AT BEDTIME   Centrum Silver 50+Men Tabs Take 1 tablet by mouth daily with breakfast.   gabapentin 100 MG capsule Commonly known as: NEURONTIN TAKE 2 CAPSULES BY MOUTH EVERY MORNING AND 3 CAPSULES AT BEDTIME What changed: See the new instructions.   levothyroxine 50 MCG tablet Commonly known as: SYNTHROID TAKE 1 TABLET(50 MCG) BY MOUTH DAILY What changed: See the new instructions.   lisinopril  20 MG tablet Commonly known as: ZESTRIL TAKE 1 TABLET(20 MG) BY MOUTH AT BEDTIME   LORazepam 1 MG tablet Commonly known as: ATIVAN TAKE 1/2 TO 1 TABLET(0.5 TO 1 MG) BY MOUTH AT BEDTIME What changed: See the new instructions.   METAMUCIL FIBER PO Take 1 packet by mouth at bedtime.   ondansetron 4 MG disintegrating tablet Commonly known as: ZOFRAN-ODT Take 4 mg by mouth every 8 (eight) hours as needed for nausea or vomiting (dissolve orally).   ondansetron 4 MG tablet Commonly known as: ZOFRAN Take 1 tablet (4 mg total) by mouth every 6 (six) hours as needed for nausea.   phenazopyridine 100 MG tablet Commonly known as: PYRIDIUM Take 1 tablet (100 mg total) by mouth 3 (three) times daily as needed (dysuria).   Probiotic Digestive Support Caps Take 1 capsule by mouth daily.   tamsulosin 0.4 MG Caps capsule Commonly known as: FLOMAX TAKE 1 CAPSULE BY MOUTH AT BEDTIME   traMADol 50 MG tablet Commonly known as: ULTRAM Take 1 tablet (50 mg total) by mouth every 6 (six) hours as  needed. What changed: reasons to take this   Vitamin D3 1000 units Caps Take 1,000 Units by mouth daily.        Follow-up Information     Burchette, Elberta Fortis, MD Follow up.   Specialty: Family Medicine Contact information: [address] [place] [address] [phone]         Rene Paci, MD Follow up.   Specialty: Urology Why: Please call the doctor's office to make an appointment within 1 week mobile of the stent and Foley Contact information: [address] [place] [address] [phone]                Allergies  Allergen Reactions   Bactrim [Sulfamethoxazole-Trimethoprim] Itching   Milk Protein Diarrhea    Consultations: urology   Procedures/Studies: DG C-Arm 1-60 Min-No Report Result Date: 05/03/2023 Fluoroscopy was utilized by the requesting physician.  No radiographic interpretation.   CT ABDOMEN PELVIS W CONTRAST Result  Date: 05/03/2023 CLINICAL DATA:  Left flank pain with hematuria. EXAM: CT ABDOMEN AND PELVIS WITH CONTRAST TECHNIQUE: Multidetector CT imaging of the abdomen and pelvis was performed using the standard protocol following bolus administration of intravenous contrast. RADIATION DOSE REDUCTION: This exam was performed according to the departmental dose-optimization program which includes automated exposure control, adjustment of the mA and/or kV according to patient size and/or use of iterative reconstruction technique. CONTRAST:  80mL OMNIPAQUE IOHEXOL 300 MG/ML  SOLN COMPARISON:  03/02/2021 FINDINGS: Lower chest: Unremarkable. Hepatobiliary: No suspicious focal abnormality within the liver parenchyma. There is no evidence for gallstones, gallbladder wall thickening, or pericholecystic fluid. No intrahepatic or extrahepatic biliary dilation. Pancreas: No focal mass lesion. No dilatation of the main duct. No intraparenchymal cyst. No peripancreatic edema. Scattered pancreatic parenchymal calcification suggests a history of chronic pancreatitis. Spleen: Tiny hypodensities in the spleen are stable in the interval, too small to characterize but compatible with benign etiology. No followup imaging is recommended. Adrenals/Urinary Tract: No adrenal nodule or mass. 2 mm nonobstructing stone identified lower pole right kidney. Right ureter unremarkable. Despite the lack of secondary changes in the right kidney and ureter, a 1-2 mm stone is identified in the right UVJ (image 70/2). Given persistence, this may represent a mucosal calcification in the region of the UVJ. Mild to moderate left hydroureteronephrosis is associated with left perinephric stranding/edema. Left ureter remains dilated into the pelvis where a 4 x 5 x 3 mm distal left ureteral stone is identified, similar to prior. Urinary bladder otherwise unremarkable. Stomach/Bowel: Stomach is unremarkable. No gastric wall thickening. No evidence of outlet obstruction.  Duodenum does not cross the midline as expected. Cecal tip is in the right lower quadrant as expected. The terminal ileum is normal. The appendix is normal. No evidence for small bowel obstruction. Diverticular changes are noted in the left colon without evidence of diverticulitis. Vascular/Lymphatic: There is advanced atherosclerotic calcification of the abdominal aorta without aneurysm. There is no gastrohepatic or hepatoduodenal ligament lymphadenopathy. No retroperitoneal or mesenteric lymphadenopathy. No pelvic sidewall lymphadenopathy. Reproductive: The prostate gland and seminal vesicles are unremarkable. Other: No intraperitoneal free fluid. Musculoskeletal: Small left groin hernia contains only fat. Scattered tiny sclerotic bone lesions are stable in the interval. 19 mm sclerotic lesion anterior left iliac crest is stable in the interval, likely benign. IMPRESSION: 1. 4 x 5 x 3 mm distal left ureteral stone with mild to moderate left hydroureteronephrosis and left perinephric stranding/edema. This is similar to prior. Findings may reflect non migration of this  distal left ureteral stone in the long interval since the prior study. Superinfection proximal to the ureteral stone cannot be excluded. 2. Despite the lack of secondary changes in the right kidney and ureter, a 1-2 mm stone is identified in the right UVJ region. Given persistence, this may represent non migration of the stone although a mucosal calcification in the region of the UVJ is also a consideration. 3. 2 mm nonobstructing stone lower pole right kidney. 4. Left colonic diverticulosis without diverticulitis. 5. Small left groin hernia contains only fat. 6.  Aortic Atherosclerois (ICD10-170.0) Electronically Signed   By: Kennith Center M.D.   On: 05/03/2023 08:48   (Echo, Carotid, EGD, Colonoscopy, ERCP)    Subjective: Patient sitting up in chair eating breakfast son at bedside no events overnight he is ambulated this morning anxious to go  home Foley in place draining clear urine no complaints of pain  Discharge Exam: Vitals:   05/04/23 0550 05/04/23 0950  BP: (!) 101/55 (!) 93/51  Pulse: 79 79  Resp: 16 18  Temp: 97.8 F (36.6 C) 98.1 F (36.7 C)  SpO2: 97% 100%   Vitals:   05/03/23 2059 05/04/23 0209 05/04/23 0550 05/04/23 0950  BP: 133/79 (!) 104/58 (!) 101/55 (!) 93/51  Pulse: 85 80 79 79  Resp: 19 20 16 18   Temp: 97.7 F (36.5 C) 97.8 F (36.6 C) 97.8 F (36.6 C) 98.1 F (36.7 C)  TempSrc: Oral Oral Oral Oral  SpO2: 98% 99% 97% 100%  Weight:      Height:        General: Pt is alert, awake, not in acute distress Cardiovascular: RRR, S1/S2 +, no rubs, no gallops Respiratory: CTA bilaterally, no wheezing, no rhonchi Abdominal: Soft, NT, ND, bowel sounds + Extremities: no edema, no cyanosis    The results of significant diagnostics from this hospitalization (including imaging, microbiology, ancillary and laboratory) are listed below for reference.     Microbiology: No results found for this or any previous visit (from the past 240 hours).   Labs: BNP (last 3 results) No results for input(s): "BNP" in the last 8760 hours. Basic Metabolic Panel: Recent Labs  Lab 05/03/23 0728 05/04/23 0545  NA 135 139  K 4.5 4.5  CL 103 110  CO2 24 21*  GLUCOSE 128* 118*  BUN 27* 27*  CREATININE 1.70* 1.58*  CALCIUM 9.1 8.8*   Liver Function Tests: Recent Labs  Lab 05/03/23 0728 05/04/23 0545  AST 14* 13*  ALT 14 12  ALKPHOS 55 42  BILITOT 1.0 0.5  PROT 6.5 5.6*  ALBUMIN 4.1 3.1*   Recent Labs  Lab 05/03/23 0728  LIPASE 25   No results for input(s): "AMMONIA" in the last 168 hours. CBC: Recent Labs  Lab 05/03/23 0728 05/04/23 0545  WBC 14.0* 13.5*  NEUTROABS 11.6*  --   HGB 12.6* 11.8*  HCT 38.8* 36.0*  MCV 105.1* 106.5*  PLT 249 245   Cardiac Enzymes: No results for input(s): "CKTOTAL", "CKMB", "CKMBINDEX", "TROPONINI" in the last 168 hours. BNP: Invalid input(s):  "POCBNP" CBG: No results for input(s): "GLUCAP" in the last 168 hours. D-Dimer No results for input(s): "DDIMER" in the last 72 hours. Hgb A1c No results for input(s): "HGBA1C" in the last 72 hours. Lipid Profile No results for input(s): "CHOL", "HDL", "LDLCALC", "TRIG", "CHOLHDL", "LDLDIRECT" in the last 72 hours. Thyroid function studies No results for input(s): "TSH", "T4TOTAL", "T3FREE", "THYROIDAB" in the last 72 hours.  Invalid input(s): "FREET3" Anemia work up  No results for input(s): "VITAMINB12", "FOLATE", "FERRITIN", "TIBC", "IRON", "RETICCTPCT" in the last 72 hours. Urinalysis    Component Value Date/Time   COLORURINE COLORLESS (A) 05/03/2023 0728   APPEARANCEUR CLEAR 05/03/2023 0728   LABSPEC >1.046 (H) 05/03/2023 0728   PHURINE 5.5 05/03/2023 0728   GLUCOSEU NEGATIVE 05/03/2023 0728   HGBUR TRACE (A) 05/03/2023 0728   HGBUR negative 08/19/2008 0759   BILIRUBINUR NEGATIVE 05/03/2023 0728   KETONESUR NEGATIVE 05/03/2023 0728   PROTEINUR NEGATIVE 05/03/2023 0728   UROBILINOGEN 0.2 12/04/2008 1612   NITRITE NEGATIVE 05/03/2023 0728   LEUKOCYTESUR NEGATIVE 05/03/2023 0728   Sepsis Labs Recent Labs  Lab 05/03/23 0728 05/04/23 0545  WBC 14.0* 13.5*   Microbiology No results found for this or any previous visit (from the past 240 hours).   Time coordinating discharge: 39 minutes  SIGNED:  Alwyn Ren, MD  [facility] 05/04/2023, 2:15 PM

## 2023-05-05 ENCOUNTER — Encounter (HOSPITAL_COMMUNITY): Payer: Self-pay | Admitting: Urology

## 2023-05-05 NOTE — Anesthesia Postprocedure Evaluation (Signed)
 Anesthesia Post Note  Patient: Timothy Garrett  Procedure(s) Performed: CYSTOSCOPY/URETEROSCOPY/HOLMIUM LASER/STENT PLACEMENT (Bilateral)     Patient location during evaluation: PACU Anesthesia Type: General Level of consciousness: awake and alert Pain management: pain level controlled Vital Signs Assessment: post-procedure vital signs reviewed and stable Respiratory status: spontaneous breathing, nonlabored ventilation, respiratory function stable and patient connected to nasal cannula oxygen Cardiovascular status: blood pressure returned to baseline and stable Postop Assessment: no apparent nausea or vomiting Anesthetic complications: no  No notable events documented.  Last Vitals:  Vitals:   05/04/23 0550 05/04/23 0950  BP: (!) 101/55 (!) 93/51  Pulse: 79 79  Resp: 16 18  Temp: 36.6 C 36.7 C  SpO2: 97% 100%    Last Pain:  Vitals:   05/04/23 0950  TempSrc: Oral  PainSc:                  Akua Blethen L Nansi [place]

## 2023-05-06 ENCOUNTER — Encounter: Payer: Self-pay | Admitting: Family Medicine

## 2023-05-06 ENCOUNTER — Ambulatory Visit: Admitting: Family Medicine

## 2023-05-06 VITALS — BP 122/64 | HR 83 | Temp 97.9°F | Ht 69.0 in | Wt 131.9 lb

## 2023-05-06 DIAGNOSIS — N1330 Unspecified hydronephrosis: Secondary | ICD-10-CM

## 2023-05-06 DIAGNOSIS — E039 Hypothyroidism, unspecified: Secondary | ICD-10-CM

## 2023-05-06 DIAGNOSIS — N179 Acute kidney failure, unspecified: Secondary | ICD-10-CM | POA: Diagnosis not present

## 2023-05-06 DIAGNOSIS — N200 Calculus of kidney: Secondary | ICD-10-CM | POA: Diagnosis not present

## 2023-05-06 NOTE — Progress Notes (Signed)
 Established Patient Office Visit  Subjective   Patient ID: Timothy Garrett, male    DOB: 1932-01-25  Age: 89 y.o. MRN: [id number]  Chief Complaint  Patient presents with   Hospitalization Follow-up    Hospital follow-up for Kidney stones     HPI   Hospital follow up.  Accompanied by daughter.  Chronic problems include history of hypertension, GERD, IBS, hypothyroidism, BPH, kidney stone, spinal stenosis.  Recently developed some left flank pain and nausea.  Went to urgent care and differential of diverticulitis versus nephrolithiasis.  Was placed on Augmentin.  Pain progressed and patient went to ER for further evaluation.  Denied any fever.  Had  white count of 14,000 with 83% neutrophils.  CT abdomen pelvis showed 4 x 5 x 3 mm left ureteral stone with mild to moderate left hydronephrosis.  2 mm nonobstructing stone lower pole right kidney.  Left colon diverticulosis without diverticulitis.  Patient seen by urology.  Status post cystoscopy with urethral dilatation, bilateral ureteroscopy with laser lithotripsy of left distal ureter stone and bilateral ureteral stent placement, bilateral retrograde pyelogram.  Remains on Augmentin.  Follow-up with urologist scheduled for this coming Monday for stent and catheter removal.  Initial blood pressure elevation in the ER but came down significantly and lisinopril was held during hospitalization.  Continue to hold this.  No fever since discharge.  Overall doing well though he has had some mild right flank pain which he thinks may be referred pain.  Past Medical History:  Diagnosis Date   Arthritis    Diverticulitis    Diverticulitis    GERD (gastroesophageal reflux disease)    Heartburn   Hyperlipemia    Hypertension    IBS (irritable bowel syndrome)    Kidney stones    Past Surgical History:  Procedure Laterality Date   BACK SURGERY     CYSTOSCOPY/URETEROSCOPY/HOLMIUM LASER/STENT PLACEMENT Bilateral 05/03/2023   Procedure:  CYSTOSCOPY/URETEROSCOPY/HOLMIUM LASER/STENT PLACEMENT;  Surgeon: Rene Paci, MD;  Location: [facility];  Service: Urology;  Laterality: Bilateral;   EYE SURGERY  2016   cataract surgery   TONSILLECTOMY  1941   ulcer surgery      reports that he has quit smoking. He has never used smokeless tobacco. He reports that he does not drink alcohol and does not use drugs. family history includes Heart attack in his father, paternal grandfather, and sister; Heart disease in his father; Hypertension in his mother; Stomach cancer in his mother; Stroke in his maternal grandfather. Allergies  Allergen Reactions   Bactrim [Sulfamethoxazole-Trimethoprim] Itching   Milk Protein Diarrhea    Review of Systems  Constitutional:  Negative for chills and fever.  Respiratory:  Negative for cough and shortness of breath.   Cardiovascular:  Negative for chest pain.  Gastrointestinal:  Negative for abdominal pain, nausea and vomiting.  Musculoskeletal:  Positive for back pain.      Objective:     BP 122/64 (BP Location: Left Arm, Patient Position: Sitting, Cuff Size: Normal)   Pulse 83   Temp 97.9 F (36.6 C) (Oral)   Ht 5\' 9"  (1.753 m)   Wt 131 lb 14.4 oz (59.8 kg)   SpO2 97%   BMI 19.48 kg/m  BP Readings from Last 3 Encounters:  05/06/23 122/64  05/04/23 (!) 93/51  01/24/23 118/64   Wt Readings from Last 3 Encounters:  05/06/23 131 lb 14.4 oz (59.8 kg)  05/03/23 130 lb (59 kg)  01/24/23 138 lb 9.6 oz (62.9 kg)  Physical Exam Vitals reviewed.  Constitutional:      General: He is not in acute distress.    Appearance: Normal appearance. He is not ill-appearing or toxic-appearing.  Cardiovascular:     Rate and Rhythm: Normal rate and regular rhythm.  Pulmonary:     Effort: Pulmonary effort is normal.     Breath sounds: Normal breath sounds.  Genitourinary:    Comments: Foley catheter in place Musculoskeletal:     Right lower leg: No edema.     Left lower leg: No edema.   Neurological:     General: No focal deficit present.     Mental Status: He is alert.      No results found for any visits on 05/06/23.  Last CBC Lab Results  Component Value Date   WBC 13.5 (H) 05/04/2023   HGB 11.8 (L) 05/04/2023   HCT 36.0 (L) 05/04/2023   MCV 106.5 (H) 05/04/2023   MCH 34.9 (H) 05/04/2023   RDW 13.2 05/04/2023   PLT 245 05/04/2023   Last metabolic panel Lab Results  Component Value Date   GLUCOSE 118 (H) 05/04/2023   NA 139 05/04/2023   K 4.5 05/04/2023   CL 110 05/04/2023   CO2 21 (L) 05/04/2023   BUN 27 (H) 05/04/2023   CREATININE 1.58 (H) 05/04/2023   GFRNONAA 42 (L) 05/04/2023   CALCIUM 8.8 (L) 05/04/2023   PROT 5.6 (L) 05/04/2023   ALBUMIN 3.1 (L) 05/04/2023   BILITOT 0.5 05/04/2023   ALKPHOS 42 05/04/2023   AST 13 (L) 05/04/2023   ALT 12 05/04/2023   ANIONGAP 8 05/04/2023   Last lipids Lab Results  Component Value Date   CHOL 152 11/04/2022   HDL 64.50 11/04/2022   LDLCALC 49 11/04/2022   LDLDIRECT 123.0 12/28/2018   TRIG 192.0 (H) 11/04/2022   CHOLHDL 2 11/04/2022   Last thyroid functions Lab Results  Component Value Date   TSH 0.61 11/04/2022      The ASCVD Risk score (Arnett DK, et al., 2019) failed to calculate for the following reasons:   The 2019 ASCVD risk score is only valid for ages 41 to 36    Assessment & Plan:   #1 recent left sided kidney stone with hydronephrosis status post lithotripsy and ureteral stenting as above.  Patient scheduled to see urologist Monday for catheter and stent removal.  No fever or other concerns since discharge  #2 hypertension history.  Has been on lisinopril 20 mg daily but blood pressure today off medication 122/64.  Continue to monitor for now.  Continue to hold lisinopril unless blood pressure consistently climbing up over 140.  #3 history of hypothyroidism.  TSH back in September stable.  Continue current dose of levothyroxine No follow-ups on file.    Evelena Peat, MD

## 2023-05-06 NOTE — Patient Instructions (Signed)
 Stay well hydrated!  Set up follow up lab for 2 weeks from now  Continue to hold the Lisinopril unless systolic (top number) > 140.

## 2023-05-12 DIAGNOSIS — N201 Calculus of ureter: Secondary | ICD-10-CM | POA: Diagnosis not present

## 2023-05-16 DIAGNOSIS — R310 Gross hematuria: Secondary | ICD-10-CM | POA: Diagnosis not present

## 2023-05-25 ENCOUNTER — Other Ambulatory Visit: Payer: Self-pay | Admitting: Family Medicine

## 2023-06-16 DIAGNOSIS — N202 Calculus of kidney with calculus of ureter: Secondary | ICD-10-CM | POA: Diagnosis not present

## 2023-06-27 DIAGNOSIS — R8271 Bacteriuria: Secondary | ICD-10-CM | POA: Diagnosis not present

## 2023-06-27 DIAGNOSIS — R350 Frequency of micturition: Secondary | ICD-10-CM | POA: Diagnosis not present

## 2023-06-27 DIAGNOSIS — R300 Dysuria: Secondary | ICD-10-CM | POA: Diagnosis not present

## 2023-06-27 DIAGNOSIS — N401 Enlarged prostate with lower urinary tract symptoms: Secondary | ICD-10-CM | POA: Diagnosis not present

## 2023-06-27 DIAGNOSIS — N202 Calculus of kidney with calculus of ureter: Secondary | ICD-10-CM | POA: Diagnosis not present

## 2023-07-01 ENCOUNTER — Other Ambulatory Visit: Payer: Self-pay | Admitting: Family Medicine

## 2023-07-01 ENCOUNTER — Other Ambulatory Visit (HOSPITAL_COMMUNITY): Payer: Self-pay | Admitting: Urology

## 2023-07-01 DIAGNOSIS — R339 Retention of urine, unspecified: Secondary | ICD-10-CM

## 2023-07-12 ENCOUNTER — Other Ambulatory Visit: Payer: Self-pay | Admitting: Family Medicine

## 2023-07-12 DIAGNOSIS — I10 Essential (primary) hypertension: Secondary | ICD-10-CM

## 2023-07-21 DIAGNOSIS — N200 Calculus of kidney: Secondary | ICD-10-CM | POA: Diagnosis not present

## 2023-07-21 DIAGNOSIS — N401 Enlarged prostate with lower urinary tract symptoms: Secondary | ICD-10-CM | POA: Diagnosis not present

## 2023-07-21 DIAGNOSIS — R300 Dysuria: Secondary | ICD-10-CM | POA: Diagnosis not present

## 2023-07-21 DIAGNOSIS — R350 Frequency of micturition: Secondary | ICD-10-CM | POA: Diagnosis not present

## 2023-07-30 ENCOUNTER — Other Ambulatory Visit: Payer: Self-pay | Admitting: Family Medicine

## 2023-08-01 ENCOUNTER — Other Ambulatory Visit: Payer: Self-pay | Admitting: Family Medicine

## 2023-08-01 ENCOUNTER — Other Ambulatory Visit: Payer: Self-pay

## 2023-08-01 DIAGNOSIS — M48061 Spinal stenosis, lumbar region without neurogenic claudication: Secondary | ICD-10-CM | POA: Diagnosis not present

## 2023-08-01 MED ORDER — ATORVASTATIN CALCIUM 20 MG PO TABS: 10.0000 mg | ORAL_TABLET | Freq: Every day | ORAL | 0 refills | Status: DC

## 2023-08-11 ENCOUNTER — Other Ambulatory Visit: Payer: Self-pay | Admitting: Family Medicine

## 2023-09-02 ENCOUNTER — Other Ambulatory Visit: Payer: Self-pay | Admitting: Family Medicine

## 2023-09-22 ENCOUNTER — Other Ambulatory Visit: Payer: Self-pay | Admitting: Family Medicine

## 2023-09-22 DIAGNOSIS — F419 Anxiety disorder, unspecified: Secondary | ICD-10-CM

## 2023-09-22 MED ORDER — LORAZEPAM 1 MG PO TABS: ORAL_TABLET | ORAL | 5 refills | Status: DC

## 2023-09-22 NOTE — Telephone Encounter (Signed)
 Copied from CRM [id number]. Topic: Clinical - Medication Refill >> Sep 22, 2023 11:33 AM Martinique E wrote: Medication: LORazepam  (ATIVAN ) 1 MG tablet  Has the patient contacted their pharmacy? Yes (Agent: If no, request that the patient contact the pharmacy for the refill. If patient does not wish to contact the pharmacy document the reason why and proceed with request.) (Agent: If yes, when and what did the pharmacy advise?)  This is the patient's preferred pharmacy:  [facility] - [place], [place] - [address] [place] [address] Phone: [phone] Fax: [phone]   Is this the correct pharmacy for this prescription? Yes If no, delete pharmacy and type the correct one.   Has the prescription been filled recently? No  Is the patient out of the medication? No, 3-4 days left.  Has the patient been seen for an appointment in the last year OR does the patient have an upcoming appointment? Yes  Can we respond through MyChart? Yes  Agent: Please be advised that Rx refills may take up to 3 business days. We ask that you follow-up with your pharmacy.

## 2023-10-01 ENCOUNTER — Telehealth: Payer: Self-pay

## 2023-10-01 ENCOUNTER — Telehealth: Payer: Self-pay | Admitting: Family Medicine

## 2023-10-01 ENCOUNTER — Other Ambulatory Visit: Payer: Self-pay | Admitting: Family Medicine

## 2023-10-01 MED ORDER — ATORVASTATIN CALCIUM 20 MG PO TABS: 10.0000 mg | ORAL_TABLET | Freq: Every day | ORAL | 0 refills | Status: DC

## 2023-10-01 NOTE — Telephone Encounter (Unsigned)
 Copied from CRM #[id number]. Topic: Clinical - Medication Refill >> Oct 01, 2023 12:44 PM Shereese L wrote: Medication:atorvastatin  (LIPITOR) 20 MG tablet  Has the patient contacted their pharmacy? Yes (Agent: If no, request that the patient contact the pharmacy for the refill. If patient does not wish to contact the pharmacy document the reason why and proceed with request.) (Agent: If yes, when and what did the pharmacy advise?)  This is the patient's preferred pharmacy:  [facility] - [place], [place] - [address] [place] [address] Phone: [phone] Fax: [phone]  Is this the correct pharmacy for this prescription? Yes If no, delete pharmacy and type the correct one.   Has the prescription been filled recently? Yes  Is the patient out of the medication? Yes  Has the patient been seen for an appointment in the last year OR does the patient have an upcoming appointment? Yes  Can we respond through MyChart? Yes  Agent: Please be advised that Rx refills may take up to 3 business days. We ask that you follow-up with your pharmacy.

## 2023-10-01 NOTE — Telephone Encounter (Signed)
 Copied from CRM #[id number]. Topic: Clinical - Medication Refill >> Oct 01, 2023 12:44 PM Shereese L wrote: Medication:atorvastatin  (LIPITOR) 20 MG tablet  Has the patient contacted their pharmacy? Yes (Agent: If no, request that the patient contact the pharmacy for the refill. If patient does not wish to contact the pharmacy document the reason why and proceed with request.) (Agent: If yes, when and what did the pharmacy advise?)  This is the patient's preferred pharmacy:  [facility] - [place], [place] - [address] [place] [address] Phone: [phone] Fax: [phone]  Is this the correct pharmacy for this prescription? Yes If no, delete pharmacy and type the correct one.   Has the prescription been filled recently? Yes  Is the patient out of the medication? Yes  Has the patient been seen for an appointment in the last year OR does the patient have an upcoming appointment? Yes  Can we respond through MyChart? Yes  Agent: Please be advised that Rx refills may take up to 3 business days. We ask that you follow-up with your pharmacy.

## 2023-10-01 NOTE — Telephone Encounter (Signed)
 Rx sent.

## 2023-10-02 ENCOUNTER — Other Ambulatory Visit: Payer: Self-pay | Admitting: Family Medicine

## 2023-10-03 ENCOUNTER — Ambulatory Visit: Payer: Self-pay

## 2023-10-03 ENCOUNTER — Ambulatory Visit (INDEPENDENT_AMBULATORY_CARE_PROVIDER_SITE_OTHER)

## 2023-10-03 ENCOUNTER — Ambulatory Visit: Admitting: Family Medicine

## 2023-10-03 ENCOUNTER — Encounter: Payer: Self-pay | Admitting: Family Medicine

## 2023-10-03 VITALS — BP 130/58 | HR 71 | Temp 98.5°F | Ht 69.0 in | Wt 142.6 lb

## 2023-10-03 DIAGNOSIS — M25511 Pain in right shoulder: Secondary | ICD-10-CM

## 2023-10-03 DIAGNOSIS — M19011 Primary osteoarthritis, right shoulder: Secondary | ICD-10-CM | POA: Diagnosis not present

## 2023-10-03 MED ORDER — TRAMADOL HCL 50 MG PO TABS: 50.0000 mg | ORAL_TABLET | Freq: Four times a day (QID) | ORAL | 0 refills | Status: DC | PRN

## 2023-10-03 NOTE — Progress Notes (Signed)
   Acute Office Visit  Subjective:     Patient ID: Timothy Garrett, male    DOB: 1931/05/14, 89 y.o.   MRN: [id number]  Chief Complaint  Patient presents with   Fall    Patient states he fell on August 19th, feet gave away and he fell backwards, landed onto right shoulder and complains of right shoulder pain x3 days, tried Tylenol  and Ibuprofen      Fall   Patient is in today for acute fall with shoulder pain, he feel backwards and he fell directly on the right shoulder. Was not able to catch himself. States that it hurts on the top of his shoulder where the collarbone meets the shoulder. States that he is having trouble lifting his arm. No numbness or tingling in the arm or hand. States he also hit his elbow and it has an abrasion on it. Can lift his arm to about the level of his shoulder but cannot fully abduct on that side.   Review of Systems  All other systems reviewed and are negative.       Objective:    BP (!) 130/58   Pulse 71   Temp 98.5 F (36.9 C) (Oral)   Ht 5' 9 (1.753 m)   Wt 142 lb 9.6 oz (64.7 kg)   SpO2 98%   BMI 21.06 kg/m    Physical Exam Vitals reviewed.  Constitutional:      Appearance: Normal appearance. He is normal weight.  Musculoskeletal:     Right shoulder: Tenderness (point tenderness of the  right shoulder at the head of the humerus near the [facility] joint) present. Decreased range of motion (decreased abduction of the arm).  Neurological:     Mental Status: He is alert and oriented to person, place, and time. Mental status is at baseline.     No results found for any visits on 10/03/23.      Assessment & Plan:   Problem List Items Addressed This Visit   None Visit Diagnoses       Acute pain of right shoulder    -  Primary   Relevant Medications   traMADol  (ULTRAM ) 50 MG tablet   Other Relevant Orders   DG Shoulder Right     Most likely a shoulder strain, pt is comfortable appearing in visit. Will check X-rays to rule out  any AC separation or fracture. Recommended PRN tylenol  and will refill the tramadol  for severe pain. Gave handouts on stretches/exercises for him to do at home.   Meds ordered this encounter  Medications   traMADol  (ULTRAM ) 50 MG tablet    Sig: Take 1 tablet (50 mg total) by mouth every 6 (six) hours as needed.    Dispense:  20 tablet    Refill:  0    No follow-ups on file.  Heron CHRISTELLA Sharper, MD

## 2023-10-03 NOTE — Telephone Encounter (Signed)
 Appointment made or today 10/03/2023 at 10:30 AM with Dr Heron Sharper at patient's PCP office  FYI Only or Action Required?: FYI only for provider.  Patient was last seen in primary care on 05/06/2023 by Micheal Wolm ORN, MD.  Called Nurse Triage reporting Shoulder Pain.  Symptoms began 3 days ago.  Interventions attempted: OTC medications: Tylenol  & Ibuprofen alternating and Rest, hydration, or home remedies.  Symptoms are: gradually worsening.  Triage Disposition: See HCP Within 4 Hours (Or PCP Triage)  Patient/caregiver understands and will follow disposition?: Yes             Copied from CRM [id number]. Topic: Clinical - Red Word Triage >> Oct 03, 2023  9:18 AM Logan F wrote: Red Word that prompted transfer to Nurse Triage: Pt had a fall on Garrett. Had some discomfort in the right shoulder. Wednesday was still sore. No swelling or bruising. Thursday says it was still feeling better but today it is hurting more. Reason for Disposition  [1] SEVERE pain (e.g., excruciating) AND [2] not improved 2 hours after pain medicine/ice packs  Answer Assessment - Initial Assessment Questions Timothy Garrett--landed on right side Didn't hit his head Small skin tear on right elbow  Right shoulder No bruising no swelling Tylenol  with Ibrupofen alternating     1. MECHANISM: How did the injury happen?     Fall 3 days ago 2. ONSET: When did the injury happen? (e.g., minutes, hours ago)      3 days ago 3. APPEARANCE of INJURY: What does the injury look like?      ------ 4. SEVERITY: Can you move the shoulder normally?      Hurts    got better yesterday but was worse today 5. SIZE: For cuts, bruises, or swelling, ask: How large is it? (e.g., inches or centimeters;  entire joint)      Small skin tear right elbow 6. PAIN: Is there pain? If Yes, ask: How bad is the pain?  (Scale 0-10; or none, mild, moderate, severe)     5 7. TETANUS: For any breaks in the skin, ask:  When was your last tetanus booster?     unsure 8. OTHER SYMPTOMS: Do you have any other symptoms? (e.g., loss of sensation)     ---  Protocols used: Shoulder Injury-A-AH

## 2023-10-08 ENCOUNTER — Other Ambulatory Visit: Payer: Self-pay | Admitting: Family Medicine

## 2023-10-08 DIAGNOSIS — E039 Hypothyroidism, unspecified: Secondary | ICD-10-CM

## 2023-10-14 ENCOUNTER — Other Ambulatory Visit: Payer: Self-pay | Admitting: Family Medicine

## 2023-10-16 ENCOUNTER — Ambulatory Visit: Payer: Self-pay | Admitting: Family Medicine

## 2023-10-20 DIAGNOSIS — M5416 Radiculopathy, lumbar region: Secondary | ICD-10-CM | POA: Diagnosis not present

## 2023-10-27 DIAGNOSIS — N201 Calculus of ureter: Secondary | ICD-10-CM | POA: Diagnosis not present

## 2023-10-27 DIAGNOSIS — N3021 Other chronic cystitis with hematuria: Secondary | ICD-10-CM | POA: Diagnosis not present

## 2023-11-20 ENCOUNTER — Other Ambulatory Visit: Payer: Self-pay | Admitting: Family Medicine

## 2023-11-26 ENCOUNTER — Other Ambulatory Visit: Payer: Self-pay | Admitting: Family Medicine

## 2023-11-26 DIAGNOSIS — E039 Hypothyroidism, unspecified: Secondary | ICD-10-CM

## 2023-11-26 NOTE — Telephone Encounter (Signed)
 Copied from CRM [id number]. Topic: Clinical - Medication Refill >> Nov 26, 2023 12:15 PM Pinkey ORN wrote: Medication: levothyroxine  (SYNTHROID ) 50 MCG tablet  Has the patient contacted their pharmacy? Yes (Agent: If no, request that the patient contact the pharmacy for the refill. If patient does not wish to contact the pharmacy document the reason why and proceed with request.) (Agent: If yes, when and what did the pharmacy advise?)  This is the patient's preferred pharmacy:  [facility] - [place], [place] - [address] [place] [address] Phone: [phone] Fax: [phone]   Is this the correct pharmacy for this prescription? Yes If no, delete pharmacy and type the correct one.   Has the prescription been filled recently? No  Is the patient out of the medication? No  Has the patient been seen for an appointment in the last year OR does the patient have an upcoming appointment? Yes  Can we respond through MyChart? Yes  Agent: Please be advised that Rx refills may take up to 3 business days. We ask that you follow-up with your pharmacy.

## 2023-11-27 MED ORDER — LEVOTHYROXINE SODIUM 50 MCG PO TABS: ORAL_TABLET | ORAL | 0 refills | Status: DC

## 2023-11-28 DIAGNOSIS — H524 Presbyopia: Secondary | ICD-10-CM | POA: Diagnosis not present

## 2023-12-15 ENCOUNTER — Telehealth: Payer: Self-pay

## 2023-12-15 MED ORDER — LORAZEPAM 1 MG PO TABS: ORAL_TABLET | ORAL | 5 refills | Status: DC

## 2023-12-15 NOTE — Telephone Encounter (Signed)
 Sent in new prescription reflecting new dosage  Wolm LELON Scarlet MD [facility]

## 2023-12-15 NOTE — Telephone Encounter (Signed)
 Copied from CRM [id number]. Topic: Clinical - Prescription Issue >> Dec 15, 2023 10:16 AM Tinnie BROCKS wrote: Reason for CRM: Katheryn from [facility] calling. She states pt said he sometimes takes one full tablet of the LORazepam  (ATIVAN ) 1 MG tablet despite instructions stating to take only a half tablet. She is wondering if the instructions can be updated to reflect take 1/2-1 tablet  [facility], [place] - [address] [place] [address] Phone: [phone] Fax: [phone] Hours: Not open 24 hours

## 2023-12-15 NOTE — Telephone Encounter (Signed)
 Noted

## 2023-12-15 NOTE — Addendum Note (Signed)
 Addended by: MICHEAL WOLM ORN on: 12/15/2023 12:57 PM   Modules accepted: Orders

## 2023-12-17 ENCOUNTER — Ambulatory Visit: Payer: Self-pay

## 2023-12-17 ENCOUNTER — Ambulatory Visit (HOSPITAL_BASED_OUTPATIENT_CLINIC_OR_DEPARTMENT_OTHER)
Admission: RE | Admit: 2023-12-17 | Discharge: 2023-12-17 | Disposition: A | Discharge status: Home / Self Care | Source: Ambulatory Visit | Attending: Family Medicine | Admitting: Family Medicine

## 2023-12-17 ENCOUNTER — Encounter: Payer: Self-pay | Admitting: Family Medicine

## 2023-12-17 ENCOUNTER — Ambulatory Visit: Admitting: Family Medicine

## 2023-12-17 VITALS — BP 128/70 | HR 85 | Temp 97.9°F | Ht 69.0 in

## 2023-12-17 DIAGNOSIS — R2689 Other abnormalities of gait and mobility: Secondary | ICD-10-CM

## 2023-12-17 DIAGNOSIS — W19XXXA Unspecified fall, initial encounter: Secondary | ICD-10-CM | POA: Diagnosis not present

## 2023-12-17 DIAGNOSIS — M48061 Spinal stenosis, lumbar region without neurogenic claudication: Secondary | ICD-10-CM

## 2023-12-17 DIAGNOSIS — M19012 Primary osteoarthritis, left shoulder: Secondary | ICD-10-CM | POA: Diagnosis not present

## 2023-12-17 DIAGNOSIS — M25512 Pain in left shoulder: Secondary | ICD-10-CM | POA: Insufficient documentation

## 2023-12-17 NOTE — Progress Notes (Signed)
 Established Patient Office Visit  Subjective   Patient ID: Timothy Garrett, male    DOB: 12-10-1931  Age: 89 y.o. MRN: [id number]  Chief Complaint  Patient presents with   Fall    Family member states the patient fell at 4am today, patient not aware and wife took 30 minutes to get him up, states he has a knot on his head   Headache    Dull headache noted since this morning, states the pain eases up when tilting head back    Patient is here with his daughter, she reported that he fell  this morning around 4 am, thinks he fell on his way to the bathroom. Daughter reports it took his wife about 30 minutes to get him up after that, not really sure how he fell, doesn't remember how he fell, wether he tripped or got dizzy/passed out. States that his right leg is a little numb, has a history of spinal stenosis in the lumbar area, has numbness and tingling in his legs chronically, takes gabapentin  for the symptoms. He goes to the spine specialist to get injections every few months or so. He uses a cane and short walker at home during the day, not sure if he was using it when he fell. Uses a standup walker as well. Wife reported he was conscious when she saw him and was trying to help him get up, was coherent and was trying to get himself up. Just doesn't remember the fall.   Fall Associated symptoms include headaches.  Headache      Current Outpatient Medications  Medication Instructions   acetaminophen  (TYLENOL ) 1,000 mg, Every 6 hours PRN   acetaminophen  (TYLENOL ) 650 mg, Oral, Every 6 hours PRN   albuterol  (VENTOLIN  HFA) 108 (90 Base) MCG/ACT inhaler 2 puffs, Inhalation, Every 4 hours PRN   atorvastatin  (LIPITOR) 20 MG tablet TAKE ONE-HALF (1/2) TABLET (= 10MG ) BY MOUTH AT BEDTIME   gabapentin  (NEURONTIN ) 100 MG capsule TAKE 2 CAPSULES BY MOUTH EVERY MORNING AND 3 CAPSULES AT BEDTIME   Lactobacillus-Inulin (PROBIOTIC DIGESTIVE SUPPORT) CAPS 1 capsule, Daily   levothyroxine   (SYNTHROID ) 50 MCG tablet TAKE 1 TABLET(50 MCG) BY MOUTH DAILY   LORazepam  (ATIVAN ) 1 MG tablet Take 1/2 to 1 tablet nightly as needed insomnia   METAMUCIL FIBER PO 1 packet, Daily at bedtime   Multiple Vitamins-Minerals (CENTRUM SILVER 50+MEN) TABS 1 tablet, Daily with breakfast   ondansetron  (ZOFRAN ) 4 mg, Oral, Every 6 hours PRN   ondansetron  (ZOFRAN -ODT) 4 mg, Every 8 hours PRN   phenazopyridine  (PYRIDIUM ) 100 mg, Oral, 3 times daily PRN   tamsulosin  (FLOMAX ) 0.4 mg, Oral, Daily at bedtime   traMADol  (ULTRAM ) 50 mg, Oral, Every 6 hours PRN   Vitamin D3 1,000 Units, Daily    Patient Active Problem List   Diagnosis Date Noted   Hydronephrosis of left kidney 05/03/2023   Nephrolithiasis 05/03/2023   AKI (acute kidney injury) 05/03/2023   Pneumonia due to COVID-19 virus 10/16/2022   Acute respiratory failure with hypoxia (HCC) 10/15/2022   CKD (chronic kidney disease) stage 3, GFR 30-59 ml/min (HCC) 08/25/2018   Hypertension 08/25/2018   BPH (benign prostatic hyperplasia) 02/24/2018   NECK PAIN 07/17/2009   CONTUSION OF MULTIPLE SITES NEC 07/17/2009   LACTOSE INTOLERANCE 06/14/2009   INTERNAL HEMORRHOIDS 06/14/2009   GERD 06/14/2009   DIVERTICULITIS, COLON 06/14/2009   IRRITABLE BOWEL SYNDROME 06/14/2009   PUD, HX OF 06/14/2009   History of colonic polyps 06/14/2009   NEPHROLITHIASIS, HX  OF 06/14/2009   TRAUMATIC ARTHROPATHY SHOULDER REGION 12/06/2008   SPINAL STENOSIS, LUMBAR 12/06/2008   UNS ADVRS EFF UNS RX MEDICINAL&BIOLOGICAL SBSTNC 05/06/2007   NEOP, MALIGNANT, SKIN, FACE NEC 11/21/2006   Malignant neoplasm of skin of face 11/21/2006   Hypothyroidism 08/01/2006   Hyperlipidemia 08/01/2006   Diverticulosis of colon 08/01/2006     Review of Systems  Neurological:  Positive for headaches.      Objective:     BP 128/70   Pulse 85   Temp 97.9 F (36.6 C) (Oral)   Ht 5' 9 (1.753 m)   SpO2 98%   BMI 21.06 kg/m    Physical Exam Vitals reviewed.   Constitutional:      Appearance: He is well-developed and normal weight.  Eyes:     Pupils: Pupils are equal, round, and reactive to light.  Cardiovascular:     Rate and Rhythm: Normal rate.     Heart sounds: Normal heart sounds. No murmur heard. Pulmonary:     Effort: Pulmonary effort is normal.     Breath sounds: Normal breath sounds. No wheezing.  Neurological:     Mental Status: He is alert and oriented to person, place, and time. Mental status is at baseline.     Cranial Nerves: No cranial nerve deficit.     Coordination: Coordination abnormal.     Gait: Gait abnormal.  Psychiatric:        Mood and Affect: Mood normal.        Behavior: Behavior normal.      No results found for any visits on 12/17/23.    The ASCVD Risk score (Arnett DK, et al., 2019) failed to calculate for the following reasons:   The 2019 ASCVD risk score is only valid for ages 69 to 73    Assessment & Plan:  Fall, initial encounter -     DG Shoulder Left; Future -     Ambulatory referral to Physical Therapy  Acute pain of left shoulder -     DG Shoulder Left; Future  Balance disorder -     Ambulatory referral to Physical Therapy  SPINAL STENOSIS, LUMBAR -     Ambulatory referral to Physical Therapy  Patient has had multiple falls in the past 3 months, he has chronic medical conditions that can increase his risk of falls, including neuropathy from spinal canal stenosis, and he is on medications that can also increase his risk of falls. I advised that he stop the gabapentin  and discuss reducing/weaning the Lorazepam   with Dr. Micheal at his next visit. Checking X-ray of the left shoulder and ordering outpatient physical therapy to reduce falls in the future.   No follow-ups on file.    Heron CHRISTELLA Sharper, MD

## 2023-12-17 NOTE — Patient Instructions (Addendum)
 Try stopping gabapentin  to see if this improves fall frequency  Speak with Dr. Micheal about reducing/weaning Lorazepam 

## 2023-12-17 NOTE — Telephone Encounter (Signed)
 FYI Only or Action Required?: FYI only for provider: appointment scheduled on 12/17/23. Pt is not currently on blood thinner, stable, A&O x 4.  Patient was last seen in primary care on 10/03/2023 by Ozell Heron HERO, MD.  Called Nurse Triage reporting Fall.  Symptoms began today.  Interventions attempted: Rest, hydration, or home remedies.  Symptoms are: stable.  Triage Disposition: See PCP When Office is Open (Within 3 Days)  Patient/caregiver understands and will follow disposition?: Yes        Copied from CRM #[id number]. Topic: Clinical - Red Word Triage >> Dec 17, 2023  8:35 AM Charlet HERO wrote: Red Word that prompted transfer to Nurse Triage [place] daughter calling fell and may have hit his cheek has a mark may have hit his forehead, has headache took tylenol  and has helped some but can't remember falling at all or getting back up also feel last week but remembers fallling then. [place] (has cane and walker) Reason for Disposition  MILD weakness (e.g., does not interfere with ability to work, go to school, normal activities)  (Exception: Mild weakness is a chronic symptom.)  Answer Assessment - Initial Assessment Questions 1. MECHANISM: How did the fall happen?     Daughter is unsure, pt cannot recall 2. DOMESTIC VIOLENCE AND ELDER ABUSE SCREENING: Did you fall because someone pushed you or tried to hurt you? If Yes, ask: Are you safe now?     None 3. ONSET: When did the fall happen? (e.g., minutes, hours, or days ago)     0400 today 4. LOCATION: What part of the body hit the ground? (e.g., back, buttocks, head, hips, knees, hands, head, stomach)     Unsure, pt does not recall, believes he may have fallen on right side 5. INJURY: Did you hurt (injure) yourself when you fell? If Yes, ask: What did you injure? Tell me more about this? (e.g., body area; type of injury; pain severity)     Yes, no open areas 6. PAIN: Is there any pain? If Yes, ask: How bad is  the pain? (e.g., Scale 0-10; or none, mild,      Mild HA, improved with Tylenol  7. SIZE: For cuts, bruises, or swelling, ask: How large is it? (e.g., inches or centimeters)      Bruise on cheek from possibly hitting cane handle, possible knot on forehead 9. OTHER SYMPTOMS: Do you have any other symptoms? (e.g., dizziness, fever, weakness; new-onset or worsening).      Pt has no memory of fall, 30 minutes post fall 10. CAUSE: What do you think caused the fall (or falling)? (e.g., dizzy spell, tripped)       Unknown  Protocols used: Falls and [facility]

## 2023-12-23 ENCOUNTER — Ambulatory Visit: Payer: Self-pay | Admitting: Family Medicine

## 2023-12-24 ENCOUNTER — Other Ambulatory Visit: Payer: Self-pay | Admitting: Family Medicine

## 2023-12-26 ENCOUNTER — Ambulatory Visit: Attending: Family Medicine | Admitting: Physical Therapy

## 2023-12-26 ENCOUNTER — Encounter: Payer: Self-pay | Admitting: Physical Therapy

## 2023-12-26 ENCOUNTER — Other Ambulatory Visit: Payer: Self-pay

## 2023-12-26 DIAGNOSIS — M48062 Spinal stenosis, lumbar region with neurogenic claudication: Secondary | ICD-10-CM

## 2023-12-26 DIAGNOSIS — M6281 Muscle weakness (generalized): Secondary | ICD-10-CM | POA: Diagnosis not present

## 2023-12-26 DIAGNOSIS — M48061 Spinal stenosis, lumbar region without neurogenic claudication: Secondary | ICD-10-CM | POA: Insufficient documentation

## 2023-12-26 DIAGNOSIS — R2689 Other abnormalities of gait and mobility: Secondary | ICD-10-CM | POA: Diagnosis not present

## 2023-12-26 DIAGNOSIS — R296 Repeated falls: Secondary | ICD-10-CM | POA: Diagnosis not present

## 2023-12-26 DIAGNOSIS — W19XXXA Unspecified fall, initial encounter: Secondary | ICD-10-CM | POA: Insufficient documentation

## 2023-12-26 DIAGNOSIS — R293 Abnormal posture: Secondary | ICD-10-CM | POA: Insufficient documentation

## 2023-12-26 NOTE — Therapy (Signed)
 OUTPATIENT PHYSICAL THERAPY THORACOLUMBAR EVALUATION   Patient Name: Timothy Garrett MRN: [id number] DOB:1931-08-11, 89 y.o., male Today's Date: 12/26/2023  END OF SESSION:  PT End of Session - 12/26/23 1222     Visit Number 1    Date for Recertification  02/20/24    Authorization Type [employer]BS Medicare - no auth req    Progress Note Due on Visit 10    PT Start Time 1015    PT Stop Time 1100    PT Time Calculation (min) 45 min    Activity Tolerance Patient tolerated treatment well    Behavior During Therapy WFL for tasks assessed/performed          Past Medical History:  Diagnosis Date   Arthritis    Diverticulitis    Diverticulitis    GERD (gastroesophageal reflux disease)    Heartburn   Hyperlipemia    Hypertension    IBS (irritable bowel syndrome)    Kidney stones    Past Surgical History:  Procedure Laterality Date   BACK SURGERY     CYSTOSCOPY/URETEROSCOPY/HOLMIUM LASER/STENT PLACEMENT Bilateral 05/03/2023   Procedure: CYSTOSCOPY/URETEROSCOPY/HOLMIUM LASER/STENT PLACEMENT;  Surgeon: Devere Lonni Righter, MD;  Location: [facility];  Service: Urology;  Laterality: Bilateral;   EYE SURGERY  2016   cataract surgery   TONSILLECTOMY  1941   ulcer surgery     Patient Active Problem List   Diagnosis Date Noted   Hydronephrosis of left kidney 05/03/2023   Nephrolithiasis 05/03/2023   AKI (acute kidney injury) 05/03/2023   Pneumonia due to COVID-19 virus 10/16/2022   Acute respiratory failure with hypoxia (HCC) 10/15/2022   CKD (chronic kidney disease) stage 3, GFR 30-59 ml/min (HCC) 08/25/2018   Hypertension 08/25/2018   BPH (benign prostatic hyperplasia) 02/24/2018   NECK PAIN 07/17/2009   CONTUSION OF MULTIPLE SITES NEC 07/17/2009   LACTOSE INTOLERANCE 06/14/2009   INTERNAL HEMORRHOIDS 06/14/2009   GERD 06/14/2009   DIVERTICULITIS, COLON 06/14/2009   IRRITABLE BOWEL SYNDROME 06/14/2009   PUD, HX OF 06/14/2009   History of colonic polyps 06/14/2009    NEPHROLITHIASIS, HX OF 06/14/2009   TRAUMATIC ARTHROPATHY SHOULDER REGION 12/06/2008   SPINAL STENOSIS, LUMBAR 12/06/2008   UNS ADVRS EFF UNS RX MEDICINAL&BIOLOGICAL SBSTNC 05/06/2007   NEOP, MALIGNANT, SKIN, FACE NEC 11/21/2006   Malignant neoplasm of skin of face 11/21/2006   Hypothyroidism 08/01/2006   Hyperlipidemia 08/01/2006   Diverticulosis of colon 08/01/2006    PCP: Wolm Scarlet, MD  REFERRING PROVIDER: Ozell Heron HERO, MD  REFERRING DIAG: W19.CHERENE (ICD-10-CM) - Fall, initial encounter R26.89 (ICD-10-CM) - Balance disorder M48.061 (ICD-10-CM) - Spinal stenosis, lumbar region, without neurogenic claudication  Rationale for Evaluation and Treatment: Rehabilitation  THERAPY DIAG:  Lumbar stenosis with neurogenic claudication  Repeated falls  Muscle weakness (generalized)  Abnormal posture  Other abnormalities of gait and mobility  ONSET DATE: chronic - several falls in past 3 mos  SUBJECTIVE:  SUBJECTIVE STATEMENT: Pt and his daughter Arlean present for appt.  Daughter reports increased falls over the past 3 mos.  He uses a walker in the home, has accommodations in bathroom with shower chair, grab bars, several placed walkers throughout home, handheld shower head.  Has higher toilets.  Has lumbar stenosis and legs have been more numb with Lt leg weakness and giving way.   Gets steroid injections every 3-4 mos which helps a little.   Lives in single level townhome with his wife.  Daughter Arlean lives 10 min away.  Her brother is also local [place]. Has falled 2x in last 2 weeks when up at night to go to the bathroom.  Doesn't remember second fall.  His wife took 30 min to get him up off floor.  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 Yes. 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  DIAGNOSTIC FINDINGS:  EXAM: 1 VIEW XRAY OF THE LEFT SHOULDER 12/17/2023    COMPARISON: X-ray left humerus 12/04/2008.   CLINICAL HISTORY: Left shoulder pain s/p fall.   FINDINGS:   BONES AND JOINTS: Mild glenohumeral joint osteoarthritis. Moderate acromioclavicular joint osteoarthritis. No acute fracture or dislocation.   SOFT TISSUES: No abnormal calcifications. Visualized lung is unremarkable.   IMPRESSION: 1. No acute fracture or dislocation.  PATIENT SURVEYS:  The Patient-Specific Functional Scale  Initial:  I am going to ask you to identify up to 3 important activities that you are unable to do or are having difficulty with as a result of this problem.  Today are there any activities that you are unable to do or having difficulty with because of this?  (Patient shown scale and patient rated each activity)  Follow up: When you first came in you had difficulty performing these activities.  Today do you still have difficulty?  Patient-Specific activity scoring scheme (Point to one  number):  0 1 2 3 4 5 6 7 8 9  10 Unable                                                                                                          Able to perform To perform  activity at the same Activity         Level as before                                                                                                                       Injury or problem  Activity           Walking at home with walker                                                                       Initial:         7              follow up:  2.              Getting in/out of car                                                                     Initial:       7                follow up:   SCORE: 7      COGNITION: Overall cognitive status: Within functional limits for tasks assessed     SENSATION: Numb from feet to knees bil from stenosis  MUSCLE LENGTH: Not formally assessed but tight hip flexors secondary to flexed posture  POSTURE: rounded shoulders, forward head, increased thoracic kyphosis, and flexed trunk   PALPATION:   LUMBAR ROM:  Not formally assessed due to poor stability, lacks trunk extension to upright posture secondary to stenosis, lacks trunk rotation with functional mobility  LOWER EXTREMITY ROM:    WFL  LOWER EXTREMITY MMT:   Hips 4/5 bil Knees: quads 4+/5 bil, hamstrings 4-/5 bil Ankles: 3+/5 all directions bil    FUNCTIONAL TESTS:  5 times sit to stand: 20.35 with bil arm rests, trunk remains flexed and Pt does not leg go of arm rests Timed up and go (TUG): 38.13 with 2-wheel walker  GAIT: Distance walked:  Assistive device utilized: Single point cane and Walker - 2 wheeled Level of assistance: Modified independence Comments: Pt needs min/mod assist x1 with cane use, mod ind with walker  TREATMENT DATE:  12/26/23    Pt education:  consider getting bedside  commode for multiple trips to bathroom each night to reduce risk of night-time falls     PT advised using walker full time vs option to use cane with min/mod A x1, Pt and his  daughter agreeable Get a fall alert system in place to wear or have attached to walker in home Initiated HEP                                                                                                                          PATIENT EDUCATION:  Education details: [email or web address] Person educated: Patient and Child(ren) Education method: Demonstration, Verbal cues, and Handouts Education comprehension: verbalized understanding and returned demonstration  HOME EXERCISE PROGRAM: Access Code: [email or web address] URL: [email or web address] Date: 12/26/2023 Prepared by: Orvil Milania Haubner  Exercises - Seated March  - 1 x daily - 7 x weekly - 2 sets - 20 reps - Seated Long Arc Quad  - 1 x daily - 7 x weekly - 2 sets - 10 reps - Seated Heel Toe Raises  - 2 x daily - 7 x weekly - 2 sets - 10 reps  ASSESSMENT:  CLINICAL IMPRESSION: Patient is a 89 y.o. male who was seen today for physical therapy evaluation and treatment for balance, history of falls, and spinal stenosis of the lumbar spine.  Pt has had increasing incidence of falls over the past 3 mos with 2 falls in last two weeks.  He presented with his daughter Arlean and has several family members locally who will help him get to appointments.  He lives in single level townhome with his wife.  He has several walkers placed throughout home, a shower chair and grab bars.  He uses the bathroom several times every night and last two falls have been at night.  We discussed at length ideas and ways to immediately reduce fall risk/improve safety: 1) get a bedside commode, 2) use a walker full time vs cane + mod A x1, 3) get a wearable fall alert system in place.  Pt has lumbar stenosis with neural claudication with numbness from knees to feet bil.  He has weakness about bil feet and  reports he can't feel where he places his feet.  He is a signif fall risk as measured by 5x STS and TUG tests today.  He will benefit from skilled PT to improve strength, functional mobility, gait endurance with AD, and safety with daily activities.  OBJECTIVE IMPAIRMENTS: Abnormal gait, decreased activity tolerance, decreased balance, decreased coordination, decreased endurance, decreased mobility, difficulty walking, decreased ROM, decreased strength, hypomobility, increased muscle spasms, impaired flexibility, impaired sensation, improper body mechanics, postural dysfunction, and pain.   ACTIVITY LIMITATIONS: lifting, bending, standing, squatting, stairs, transfers, dressing, and locomotion level  PARTICIPATION LIMITATIONS: community activity  PERSONAL FACTORS: Age and Time since onset of injury/illness/exacerbation are also affecting patient's functional outcome.   REHAB POTENTIAL: Excellent  CLINICAL DECISION MAKING: Stable/uncomplicated  EVALUATION COMPLEXITY: Low   GOALS: Goals reviewed with patient? Yes  SHORT TERM GOALS: Target date: 01/23/24  Pt will be ind with initial HEP Baseline: Goal status: INITIAL  2.  Pt will be compliant with use of walker full time (vs SPC + mod A x1 of family member  for outings). Baseline:  Goal status: INITIAL  3.  Pt will report no falls at night with use of a bedside commode (PT advised getting one for nighttime safety for multiple bathroom trips per night). Baseline:  Goal status: INITIAL    LONG TERM GOALS: Target date: 02/20/24  Pt will be ind with targeted HEP and understand the importance of compliance to maintain gains made in PT. Baseline:  Goal status: INITIAL  2.  Pt will report no falls of near falls within episode of care Baseline:  Goal status: INITIAL  3.  Pt will perform 5x sit to stand in 16 sec or less with use of bil UE to demo reduced fall risk Baseline:  Goal status: INITIAL  4.  Pt will achieve at least 4/5  strength in bil ankles and knees to improve gait stability Baseline:  Goal status: INITIAL  5.  Pt will perform TUG in </= 30 sec with walker to demo improved functional mobility and reduced fall risk Baseline:  Goal status: INITIAL  6.  Pt will demo gait endurance by completing at least a using walker to improve tolerance of community outings. Baseline:  Goal status: INITIAL  7. PSFS score will improve to at least 8 for both categories (household mobility, getting in/out of car)  Goal status: INITIAL  PLAN:  PT FREQUENCY: 1-2x/week  PT DURATION: 8 weeks  PLANNED INTERVENTIONS: 97110-Therapeutic exercises, 97530- Therapeutic activity, 97112- Neuromuscular re-education, 97535- Self Care, [id number]- Manual therapy, [id number]- Gait training, [phone]- Aquatic Therapy, [phone]- Traction (mechanical), [phone] (1-2 muscles), [id number] (3+ muscles)- Dry Needling, Patient/Family education, Balance training, Stair training, Joint mobilization, Spinal mobilization, DME instructions, Cryotherapy, and Moist heat.  PLAN FOR NEXT SESSION: NuStep for endurance, ankle strength, review and progress seated HEP, Pt is at mod/high risk for falls.  Did Pt get bedside commode, did Pt transition to full time use of walker? If brings rollator, assess to see if he is safe with it or needs 2-wheel walker only   Shakeda Pearse, PT 12/26/23 12:40 PM

## 2023-12-29 ENCOUNTER — Ambulatory Visit

## 2023-12-29 DIAGNOSIS — W19XXXA Unspecified fall, initial encounter: Secondary | ICD-10-CM | POA: Diagnosis not present

## 2023-12-29 DIAGNOSIS — R293 Abnormal posture: Secondary | ICD-10-CM | POA: Diagnosis not present

## 2023-12-29 DIAGNOSIS — M48061 Spinal stenosis, lumbar region without neurogenic claudication: Secondary | ICD-10-CM | POA: Diagnosis not present

## 2023-12-29 DIAGNOSIS — M48062 Spinal stenosis, lumbar region with neurogenic claudication: Secondary | ICD-10-CM

## 2023-12-29 DIAGNOSIS — R2689 Other abnormalities of gait and mobility: Secondary | ICD-10-CM | POA: Diagnosis not present

## 2023-12-29 DIAGNOSIS — M6281 Muscle weakness (generalized): Secondary | ICD-10-CM

## 2023-12-29 DIAGNOSIS — R296 Repeated falls: Secondary | ICD-10-CM

## 2023-12-29 NOTE — Therapy (Signed)
 OUTPATIENT PHYSICAL THERAPY TREATMENT   Patient Name: Timothy Garrett MRN: [id number] DOB:[birth year], 89 y.o., male Today's Date: 12/29/2023  END OF SESSION:  PT End of Session - 12/29/23 1540     Visit Number 2    Date for Recertification  02/20/24    Authorization Type [employer] Medicare - no auth req    Progress Note Due on Visit 10    PT Start Time 1447    PT Stop Time 1531    PT Time Calculation (min) 44 min    Activity Tolerance Patient tolerated treatment well    Behavior During Therapy WFL for tasks assessed/performed           Past Medical History:  Diagnosis Date   Arthritis    Diverticulitis    Diverticulitis    GERD (gastroesophageal reflux disease)    Heartburn   Hyperlipemia    Hypertension    IBS (irritable bowel syndrome)    Kidney stones    Past Surgical History:  Procedure Laterality Date   BACK SURGERY     CYSTOSCOPY/URETEROSCOPY/HOLMIUM LASER/STENT PLACEMENT Bilateral 05/03/2023   Procedure: CYSTOSCOPY/URETEROSCOPY/HOLMIUM LASER/STENT PLACEMENT;  Surgeon: Devere Lonni Righter, MD;  Location: [facility];  Service: Urology;  Laterality: Bilateral;   EYE SURGERY  2016   cataract surgery   TONSILLECTOMY  1941   ulcer surgery     Patient Active Problem List   Diagnosis Date Noted   Hydronephrosis of left kidney 05/03/2023   Nephrolithiasis 05/03/2023   AKI (acute kidney injury) 05/03/2023   Pneumonia due to COVID-19 virus 10/16/2022   Acute respiratory failure with hypoxia (HCC) 10/15/2022   CKD (chronic kidney disease) stage 3, GFR 30-59 ml/min (HCC) 08/25/2018   Hypertension 08/25/2018   BPH (benign prostatic hyperplasia) 02/24/2018   NECK PAIN 07/17/2009   CONTUSION OF MULTIPLE SITES NEC 07/17/2009   LACTOSE INTOLERANCE 06/14/2009   INTERNAL HEMORRHOIDS 06/14/2009   GERD 06/14/2009   DIVERTICULITIS, COLON 06/14/2009   IRRITABLE BOWEL SYNDROME 06/14/2009   PUD, HX OF 06/14/2009   History of colonic polyps 06/14/2009    NEPHROLITHIASIS, HX OF 06/14/2009   TRAUMATIC ARTHROPATHY SHOULDER REGION 12/06/2008   SPINAL STENOSIS, LUMBAR 12/06/2008   UNS ADVRS EFF UNS RX MEDICINAL&BIOLOGICAL SBSTNC 05/06/2007   NEOP, MALIGNANT, SKIN, FACE NEC 11/21/2006   Malignant neoplasm of skin of face 11/21/2006   Hypothyroidism 08/01/2006   Hyperlipidemia 08/01/2006   Diverticulosis of colon 08/01/2006    PCP: Wolm Scarlet, MD  REFERRING PROVIDER: Ozell Heron HERO, MD  REFERRING DIAG: W19.CHERENE (ICD-10-CM) - Fall, initial encounter R26.89 (ICD-10-CM) - Balance disorder M48.061 (ICD-10-CM) - Spinal stenosis, lumbar region, without neurogenic claudication  Rationale for Evaluation and Treatment: Rehabilitation  THERAPY DIAG:  Lumbar stenosis with neurogenic claudication  Repeated falls  Muscle weakness (generalized)  Abnormal posture  Other abnormalities of gait and mobility  ONSET DATE: chronic - several falls in past 3 mos  SUBJECTIVE:  SUBJECTIVE STATEMENT: I've done the exercises.  Celebrated my birthday over the weekend.     Pt and his daughter Arlean present for appt.  Daughter reports increased falls over the past 3 mos.  He uses a walker in the home, has accommodations in bathroom with shower chair, grab bars, several placed walkers throughout home, handheld shower head.  Has higher toilets.  Has lumbar stenosis and legs have been more numb with Lt leg weakness and giving way.   Gets steroid injections every 3-4 mos which helps a little.   Lives in single level townhome with his wife.  Daughter Arlean lives 10 min away.  Her brother is also local [place]. Has falled 2x in last 2 weeks when up at night to go to the bathroom.  Doesn't remember second fall.  His wife took 30 min to get him up off floor.  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 Yes. 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  DIAGNOSTIC FINDINGS:  EXAM: 1 VIEW XRAY OF THE LEFT SHOULDER 12/17/2023    COMPARISON: X-ray left humerus 12/04/2008.   CLINICAL HISTORY: Left shoulder pain s/p fall.   FINDINGS:   BONES AND JOINTS: Mild glenohumeral joint osteoarthritis. Moderate acromioclavicular joint osteoarthritis. No acute fracture or dislocation.   SOFT TISSUES: No abnormal calcifications. Visualized lung is unremarkable.   IMPRESSION: 1. No acute fracture or dislocation.  PATIENT SURVEYS:  The Patient-Specific Functional Scale  Initial:  I am going to ask you to identify up to 3 important activities that you are unable to do or are having difficulty with as a result of this problem.  Today are there any activities that you are unable to do or having difficulty with because of this?  (Patient shown scale and patient rated each activity)  Follow up: When you first came in you had difficulty performing these activities.  Today do you still have  difficulty?  Patient-Specific activity scoring scheme (Point to one number):  0 1 2 3 4 5 6 7 8 9  10 Unable  Able to perform To perform                                                                                                    activity at the same Activity         Level as before                                                                                                                       Injury or problem  Activity           Walking at home with walker                                                                       Initial:         7              follow up:  2.              Getting in/out of car                                                                     Initial:       7                follow up:   SCORE: 7      COGNITION: Overall cognitive status: Within functional limits for tasks assessed     SENSATION: Numb from feet to knees bil from stenosis  MUSCLE LENGTH: Not formally assessed but tight hip flexors secondary to flexed posture  POSTURE: rounded shoulders, forward head, increased thoracic kyphosis, and flexed trunk   PALPATION:   LUMBAR ROM:  Not formally assessed due to poor stability, lacks trunk extension to upright posture secondary to stenosis, lacks trunk rotation with functional mobility  LOWER EXTREMITY ROM:    WFL  LOWER EXTREMITY MMT:   Hips 4/5 bil Knees: quads 4+/5 bil, hamstrings 4-/5 bil Ankles: 3+/5 all directions bil    FUNCTIONAL TESTS:  5 times sit to stand: 20.35 with bil arm rests, trunk remains  flexed and Pt does not leg go of arm rests Timed up and go (TUG): 38.13 with 2-wheel walker  GAIT: Distance walked:  Assistive device utilized: Single point cane and Walker - 2 wheeled Level of assistance: Modified independence Comments: Pt needs min/mod assist x1 with cane use, mod ind with  walker  TREATMENT DATE:  12/29/23:  NuStep: level 5x 6 minutes-PT present to discuss progress  Seated:   Long arc quads, marching and heel/toe raises 2x10 bil each  Hip adduction with ball: 5# hold x20  Hip abduction with yellow loop x20  Sit to stand 2x5  Rows with red band 2x10 Standing hip abduction and extension 2x5 bil each Adjusted walker to be higher to encourage upright posture  12/26/23    Pt education:  consider getting bedside commode for multiple trips to bathroom each night to reduce risk of night-time falls     PT advised using walker full time vs option to use cane with min/mod A x1, Pt and his daughter agreeable Get a fall alert system in place to wear or have attached to walker in home Initiated HEP                                                                                                                          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 Person educated: Patient and Child(ren) Education method: Demonstration, Verbal cues, and Handouts Education comprehension: verbalized understanding and returned demonstration  HOME EXERCISE PROGRAM: Access Code: [email or web address] URL: [email or web address] Date: 12/29/2023 Prepared by: Burnard  Exercises - Seated March  - 1 x daily - 7 x weekly - 2 sets - 20 reps - Seated Long Arc Quad  - 1 x daily - 7 x weekly - 2 sets - 10 reps - Seated Heel Toe Raises  - 2 x daily - 7 x weekly - 2 sets - 10 reps - Seated Isometric Hip Adduction with Ball  - 2 x daily - 7 x weekly - 2 sets - 10 reps - Seated Isometric Hip Abduction with Resistance  - 2 x daily - 7 x weekly - 2 sets - 10 reps  ASSESSMENT:  CLINICAL IMPRESSION: First time follow-up after evaluation.  Pt is doing well with HEP and PT advanced to add 2 new exercises.  Pt arrived with standard walker and PT adjusted higher to improve posture in standing.  Advised to walk with 2 wheeled walker in community to reduce trunk flexion with picking  up walker.  PT monitored throughout session.  He will benefit from skilled PT to improve strength, functional mobility, gait endurance with AD, and safety with daily activities.  OBJECTIVE IMPAIRMENTS: Abnormal gait, decreased activity tolerance, decreased balance, decreased coordination, decreased endurance, decreased mobility, difficulty walking, decreased ROM, decreased strength, hypomobility, increased muscle spasms, impaired flexibility, impaired sensation, improper body mechanics, postural dysfunction, and pain.   ACTIVITY LIMITATIONS: lifting, bending, standing, squatting, stairs, transfers, dressing, and locomotion level  PARTICIPATION LIMITATIONS: community activity  PERSONAL  FACTORS: Age and Time since onset of injury/illness/exacerbation are also affecting patient's functional outcome.   REHAB POTENTIAL: Excellent  CLINICAL DECISION MAKING: Stable/uncomplicated  EVALUATION COMPLEXITY: Low   GOALS: Goals reviewed with patient? Yes  SHORT TERM GOALS: Target date: 01/23/24  Pt will be ind with initial HEP Baseline: Goal status: INITIAL  2.  Pt will be compliant with use of walker full time (vs SPC + mod A x1 of family member for outings). Baseline:  Goal status: INITIAL  3.  Pt will report no falls at night with use of a bedside commode (PT advised getting one for nighttime safety for multiple bathroom trips per night). Baseline:  Goal status: INITIAL    LONG TERM GOALS: Target date: 02/20/24  Pt will be ind with targeted HEP and understand the importance of compliance to maintain gains made in PT. Baseline:  Goal status: INITIAL  2.  Pt will report no falls of near falls within episode of care Baseline:  Goal status: INITIAL  3.  Pt will perform 5x sit to stand in 16 sec or less with use of bil UE to demo reduced fall risk Baseline:  Goal status: INITIAL  4.  Pt will achieve at least 4/5 strength in bil ankles and knees to improve gait stability Baseline:   Goal status: INITIAL  5.  Pt will perform TUG in </= 30 sec with walker to demo improved functional mobility and reduced fall risk Baseline:  Goal status: INITIAL  6.  Pt will demo gait endurance by completing at least a using walker to improve tolerance of community outings. Baseline:  Goal status: INITIAL  7. PSFS score will improve to at least 8 for both categories (household mobility, getting in/out of car)  Goal status: INITIAL  PLAN:  PT FREQUENCY: 1-2x/week  PT DURATION: 8 weeks  PLANNED INTERVENTIONS: 97110-Therapeutic exercises, 97530- Therapeutic activity, 97112- Neuromuscular re-education, 97535- Self Care, [id number]- Manual therapy, [id number]- Gait training, [phone]- Aquatic Therapy, [phone]- Traction (mechanical), [phone] (1-2 muscles), [phone] (3+ muscles)- Dry Needling, Patient/Family education, Balance training, Stair training, Joint mobilization, Spinal mobilization, DME instructions, Cryotherapy, and Moist heat.  PLAN FOR NEXT SESSION: NuStep for endurance, ankle strength, review and progress seated HEP, Pt is at mod/high risk for falls.  Did Pt get bedside commode,  If brings rollator, assess 2 wheeled walker if he brings next time.    Burnard Joy, PT 12/29/23 3:53 PM

## 2023-12-31 ENCOUNTER — Other Ambulatory Visit: Payer: Self-pay | Admitting: Family Medicine

## 2023-12-31 DIAGNOSIS — E039 Hypothyroidism, unspecified: Secondary | ICD-10-CM

## 2024-01-06 ENCOUNTER — Ambulatory Visit: Admitting: Family Medicine

## 2024-01-06 DIAGNOSIS — Z0000 Encounter for general adult medical examination without abnormal findings: Secondary | ICD-10-CM | POA: Diagnosis not present

## 2024-01-06 NOTE — Progress Notes (Signed)
 ----------------------------------------------------------------------------------------------------------------------------------------------------------------------------------------------------------------------  Because this visit was a virtual/telehealth visit, some criteria may be missing or patient reported. Any vitals not documented were not able to be obtained and vitals that have been documented are patient reported.    MEDICARE ANNUAL PREVENTIVE CARE VISIT WITH PROVIDER (Welcome to Medicare, initial annual wellness or annual wellness exam)  Virtual Visit via Video Note  I connected with Timothy Garrett on 01/06/24  by phone  and verified that I am speaking with the correct person using two identifiers.  Location patient: home Location provider:work or home office Persons participating in the virtual visit: patient, provider  Concerns and/or follow up today: detailed intake and health/risks assessment completed on flow sheets and below- please see for details. Doing well except has had several falls, s/p eval in the office. Doing ok now and is getting PT.   How often do you have a drink containing alcohol?none How many drinks containing alcohol do you have on a typical day when you are drinking?na How often do you have six or more drinks on one occasion?na Have you ever smoked?y Quit date if applicable? Quit over 50 years ago  How many packs a day do/did you smoke? na Do you use smokeless tobacco?na Do you use an illicit drugs?n Do you feel safe at home?n Last dentist visit? Does not go on a regular basis Last eye Exam and location?just had about 1 month ago - Dr. Waylan   See HM section in Epic for other details of completed HM.    ROS: negative for report of fevers, unintentional weight loss, vision changes, vision loss, hearing loss or change, chest pain, sob, hemoptysis, melena, hematochezia, hematuria,bleeding or bruising  Patient-completed extensive health risk  assessment - reviewed and discussed with the patient: See Health Risk Assessment completed with patient prior to the visit either above or in recent phone note. This was reviewed in detailed with the patient today and appropriate recommendations, orders and referrals were placed as needed per Summary below and patient instructions.   Review of Medical History: -PMH, PSH, Family History and current specialty and care providers reviewed and updated and listed below   Patient Care Team: Micheal Wolm ORN, MD as PCP - General (Family Medicine) Danis, Victory LITTIE MOULD, MD as Consulting Physician (Gastroenterology) Camillo Golas, MD as Consulting Physician (Ophthalmology) Liane Sharyne MATSU, [facility] (Inactive) as Pharmacist (Pharmacist)   Past Medical History:  Diagnosis Date   Arthritis    Diverticulitis    Diverticulitis    GERD (gastroesophageal reflux disease)    Heartburn   Hyperlipemia    Hypertension    IBS (irritable bowel syndrome)    Kidney stones     Past Surgical History:  Procedure Laterality Date   BACK SURGERY     CYSTOSCOPY/URETEROSCOPY/HOLMIUM LASER/STENT PLACEMENT Bilateral 05/03/2023   Procedure: CYSTOSCOPY/URETEROSCOPY/HOLMIUM LASER/STENT PLACEMENT;  Surgeon: Devere Lonni Righter, MD;  Location: [facility];  Service: Urology;  Laterality: Bilateral;   EYE SURGERY  2016   cataract surgery   TONSILLECTOMY  1941   ulcer surgery      Social History   Socioeconomic History   Marital status: Married    Spouse name: Not on file   Number of children: 2   Years of education: Not on file   Highest education level: Not on file  Occupational History   Occupation: Advertising account planner    Comment: Retired  Tobacco Use   Smoking status: Former   Smokeless tobacco: Never   Tobacco comments:  Quit 60 years ago  Vaping Use   Vaping status: Never Used  Substance and Sexual Activity   Alcohol use: No   Drug use: No   Sexual activity: Not Currently  Other Topics Concern   Not  on file  Social History Narrative   Lives with wife on one level condo, recently downsized   Has two children, local, 4 grandchildren   Has one dog, Sam, enjoys walking it, but no other regular exercise   Enjoys doing presenter, broadcasting, wood-working   Social Drivers of Corporate Investment Banker Strain: Low Risk  (07/24/2022)   Overall Financial Resource Strain (CARDIA)    Difficulty of Paying Living Expenses: Not hard at all  Food Insecurity: No Food Insecurity (05/04/2023)   Hunger Vital Sign    Worried About Running Out of Food in the Last Year: Never true    Ran Out of Food in the Last Year: Never true  Transportation Needs: No Transportation Needs (05/04/2023)   PRAPARE - Administrator, Civil Service (Medical): No    Lack of Transportation (Non-Medical): No  Physical Activity: Insufficiently Active (01/06/2024)   Exercise Vital Sign    Days of Exercise per Week: 7 days    Minutes of Exercise per Session: 10 min  Stress: No Stress Concern Present (01/06/2024)   [employer] of Occupational Health - Occupational Stress Questionnaire    Feeling of Stress: Not at all  Social Connections: Moderately Isolated (01/06/2024)   Social Connection and Isolation Panel    Frequency of Communication with Friends and Family: Three times a week    Frequency of Social Gatherings with Friends and Family: Three times a week    Attends Religious Services: Never    Active Member of Clubs or Organizations: No    Attends Banker Meetings: Never    Marital Status: Married  Catering Manager Violence: Not At Risk (05/04/2023)   Humiliation, Afraid, Rape, and Kick questionnaire    Fear of Current or Ex-Partner: No    Emotionally Abused: No    Physically Abused: No    Sexually Abused: No    Family History  Problem Relation Age of Onset   Stomach cancer Mother    Hypertension Mother    Heart disease Father    Heart attack Father    Heart attack Sister    Stroke Maternal  Grandfather    Heart attack Paternal Grandfather     Current Outpatient Medications on File Prior to Visit  Medication Sig Dispense Refill   acetaminophen  (TYLENOL ) 325 MG tablet Take 2 tablets (650 mg total) by mouth every 6 (six) hours as needed for mild pain (pain score 1-3) (or Fever >/= 101).     acetaminophen  (TYLENOL ) 500 MG tablet Take 1,000 mg by mouth every 6 (six) hours as needed for moderate pain, fever or headache.     albuterol  (VENTOLIN  HFA) 108 (90 Base) MCG/ACT inhaler Inhale 2 puffs into the lungs every 4 (four) hours as needed for wheezing or shortness of breath. 6.7 g 0   atorvastatin  (LIPITOR) 20 MG tablet TAKE 1/2 TABLET (=10MG ) BY MOUTH AT BEDTIME 30 tablet 0   Cholecalciferol (VITAMIN D3) 1000 units CAPS Take 1,000 Units by mouth daily.     Lactobacillus-Inulin (PROBIOTIC DIGESTIVE SUPPORT) CAPS Take 1 capsule by mouth daily.     levothyroxine  (SYNTHROID ) 50 MCG tablet TAKE 1 TABLET BY MOUTH ONCE DAILY 30 tablet 0   LORazepam  (ATIVAN ) 1 MG tablet TAKE 1/2 TO  1 TABLET BY MOUTH NIGHTLY AS NEEDED FOR insomnia 90 tablet 1   METAMUCIL FIBER PO Take 1 packet by mouth at bedtime.     Multiple Vitamins-Minerals (CENTRUM SILVER 50+MEN) TABS Take 1 tablet by mouth daily with breakfast.     ondansetron  (ZOFRAN ) 4 MG tablet Take 1 tablet (4 mg total) by mouth every 6 (six) hours as needed for nausea. 20 tablet 0   ondansetron  (ZOFRAN -ODT) 4 MG disintegrating tablet Take 4 mg by mouth every 8 (eight) hours as needed for nausea or vomiting (dissolve orally).     phenazopyridine  (PYRIDIUM ) 100 MG tablet Take 1 tablet (100 mg total) by mouth 3 (three) times daily as needed (dysuria). 10 tablet 0   tamsulosin  (FLOMAX ) 0.4 MG CAPS capsule TAKE 1 CAPSULE BY MOUTH NIGHTLY AT BEDTIME 30 capsule 2   traMADol  (ULTRAM ) 50 MG tablet Take 1 tablet (50 mg total) by mouth every 6 (six) hours as needed. 20 tablet 0   No current facility-administered medications on file prior to visit.     Allergies  Allergen Reactions   Bactrim [Sulfamethoxazole-Trimethoprim] Itching   Milk Protein Diarrhea       Physical Exam Vitals requested from patient and listed below if patient had equipment and was able to obtain at home for this virtual visit: There were no vitals filed for this visit. Estimated body mass index is 21.06 kg/m as calculated from the following:   Height as of 12/17/23: 5' 9 (1.753 m).   Weight as of 10/03/23: 142 lb 9.6 oz (64.7 kg).  EKG (optional): deferred due to virtual visit  GENERAL: alert, oriented, no acute distress detected; full vision exam deferred due to pandemic and/or virtual encounter  PSYCH/NEURO: pleasant and cooperative, no obvious depression or anxiety, speech and thought processing grossly intact, Cognitive function grossly intact  Flowsheet Row Office Visit from 10/03/2023 in [facility] at [facility]  PHQ-9 Total Score 0        01/06/2024    1:16 PM 10/03/2023   11:32 AM 07/24/2022    3:39 PM 07/18/2021    1:21 PM 01/17/2021    9:16 AM  Depression screen PHQ 2/9  Decreased Interest 0 0 0 0 0  Down, Depressed, Hopeless 0 0 0 0 0  PHQ - 2 Score 0 0 0 0 0  Altered sleeping  0     Tired, decreased energy  0     Change in appetite  0     Feeling bad or failure about yourself   0     Trouble concentrating  0     Moving slowly or fidgety/restless  0     Suicidal thoughts  0     PHQ-9 Score  0         Data saved with a previous flowsheet row definition       03/02/2021   10:27 AM 07/18/2021    1:24 PM 07/24/2022    4:02 PM 10/03/2023   10:40 AM 01/06/2024    1:04 PM  Fall Risk  Falls in the past year?  1 0 1 1  Was there an injury with Fall?  0 0 1 0  Was there an injury with Fall? - Comments  No injury or medical attention needed     Fall Risk Category Calculator  2 0 2 2  Fall Risk Category (Retired)  Moderate      (RETIRED) Patient Fall Risk Level Low fall risk  Moderate fall risk  Patient at Risk  for Falls Due to  Other (Comment) No Fall Risks Impaired balance/gait History of fall(s)  Patient at Risk for Falls Due to - Comments  Loss of balance     Fall risk Follow up   Falls prevention discussed Falls evaluation completed Falls evaluation completed;Education provided     Data saved with a previous flowsheet row definition     SUMMARY AND PLAN:  Encounter for Medicare annual wellness exam   Discussed applicable health maintenance/preventive health measures and advised and referred or ordered per patient preferences: -declined shingles and tetanus vaccines.  Health Maintenance  Topic Date Due   Zoster Vaccines- Shingrix (1 of 2) 04/07/2024 (Originally 12/23/1950)   DTaP/Tdap/Td (4 - Td or Tdap) 01/05/2025 (Originally 08/20/2018)   Medicare Annual Wellness (AWV)  01/05/2025   Pneumococcal Vaccine: 50+ Years  Completed   Influenza Vaccine  Completed   Meningococcal B Vaccine  Aged Out   COVID-19 Vaccine  Discontinued     Education and counseling on the following was provided based on the above review of health and a plan/checklist for the patient, along with additional information discussed, was provided for the patient in the patient instructions :   -Provided counseling and plan for increased risk of falling if applicable per above screening. Discussed exercise guidelines for adults with include balance exercises at least 3 days per week.  -Advised and counseled on a healthy lifestyle - including the importance of a healthy diet, regular physical activity, social connections and stress management. -Reviewed patient's current diet. Advised and counseled on a whole foods based healthy diet. A summary of a healthy diet was provided in the Patient Instructions.  -reviewed patient's current physical activity level and discussed exercise guidelines for adults. Discussed community resources and ideas for safe exercise at home to assist in meeting exercise guideline recommendations in a  safe and healthy way.  -Advise yearly dental visits at minimum and regular eye exams   Follow up: see patient instructions   Patient Instructions  I really enjoyed getting to talk with you today! I am available on Tuesdays and Thursdays for virtual visits if you have any questions or concerns, or if I can be of any further assistance.   CHECKLIST FROM ANNUAL WELLNESS VISIT:  -Follow up (please call to schedule if not scheduled after visit):   -yearly for annual wellness visit with primary care office  Here is a list of your preventive care/health maintenance measures and the plan for each if any are due:  PLAN For any measures below that may be due:   Health Maintenance  Topic Date Due   Zoster Vaccines- Shingrix (1 of 2) 04/07/2024 (Originally 12/23/1950)   DTaP/Tdap/Td (4 - Td or Tdap) 01/05/2025 (Originally 08/20/2018)   Medicare Annual Wellness (AWV)  01/05/2025   Pneumococcal Vaccine: 50+ Years  Completed   Influenza Vaccine  Completed   Meningococcal B Vaccine  Aged Out   COVID-19 Vaccine  Discontinued    -See a dentist at least yearly  -Get your eyes checked and then per your eye specialist's recommendations  -Other issues addressed today:   1. Ask physical therapist to give you list of exercises to do at home. Try to get at least 20 minutes of exercise per day. This could significatly reduce your chances of falling.    -I have included below further information regarding a healthy whole foods based diet, physical activity guidelines for adults, stress management and opportunities for social connections. I hope you  find this information useful.   -----------------------------------------------------------------------------------------------------------------------------------------------------------------------------------------------------------------------------------------------------------    NUTRITION: -eat real food: lots of colorful vegetables (half the  plate) and fruits -5-7 servings of vegetables and fruits per day (fresh or steamed is best), exp. 2 servings of vegetables with lunch and dinner and 2 servings of fruit per day. Berries and greens such as kale and collards are great choices.  -consume on a regular basis:  fresh fruits, fresh veggies, fish, nuts, seeds, healthy oils (such as olive oil, avocado oil), whole grains (make sure for bread/pasta/crackers/etc., that the first ingredient on label contains the word whole), legumes. -can eat small amounts of dairy and lean meat (no larger than the palm of your hand), but avoid processed meats such as ham, bacon, lunch meat, etc. -drink water -try to avoid fast food and pre-packaged foods, processed meat, ultra processed foods/beverages (donuts, candy, etc.) -most experts advise limiting sodium to < 2300mg  per day, should limit further is any chronic conditions such as high blood pressure, heart disease, diabetes, etc. The American Heart Association advised that < 1500mg  is is ideal -try to avoid foods/beverages that contain any ingredients with names you do not recognize  -try to avoid foods/beverages  with added sugar or sweeteners/sweets  -try to avoid sweet drinks (including diet drinks): soda, juice, Gatorade, sweet tea, power drinks, diet drinks -try to avoid white rice, white bread, pasta (unless whole grain)  EXERCISE GUIDELINES FOR ADULTS: -if you wish to increase your physical activity, do so gradually and with the approval of your doctor -STOP and seek medical care immediately if you have any chest pain, chest discomfort or trouble breathing when starting or increasing exercise  -move and stretch your body, legs, feet and arms when sitting for long periods -Physical activity guidelines for optimal health in adults: -get at least 150 minutes per week of moderate exercise (can talk, but not sing); this is about 20-30 minutes of sustained activity 5-7 days per week or two 10-15 minute  episodes of sustained activity 5-7 days per week -do some muscle building/resistance training/strength training at least 2 days per week  -balance exercises 3+ days per week:   Stand somewhere where you have something sturdy to hold onto if you lose balance    1) lift up on toes, then back down, start with 5x per day and work up to 20x   2) stand and lift one leg straight out to the side so that foot is a few inches of the floor, start with 5x each side and work up to 20x each side   3) stand on one foot, start with 5 seconds each side and work up to 20 seconds on each side  If you need ideas or help with getting more active:  -Silver sneakers https://tools.silversneakers.com  -Walk with a Doc: Http://www.duncan-williams.com/  -try to include resistance (weight lifting/strength building) and balance exercises twice per week: or the following link for ideas: http://castillo-powell.com/  buyducts.dk  STRESS MANAGEMENT: -can try meditating, or just sitting quietly with deep breathing while intentionally relaxing all parts of your body for 5 minutes daily -if you need further help with stress, anxiety or depression please follow up with your primary doctor or contact the wonderful folks at [facility]: [phone]  SOCIAL CONNECTIONS: -options in [place] if you wish to engage in more social and exercise related activities:  -Silver sneakers https://tools.silversneakers.com  -Walk with a Doc: Http://www.duncan-williams.com/  -Check out the [facility] Active Adults 50+ section on the [place] website (  hiking clubs, book clubs, cards and games, chess, exercise classes, aquatic classes and much more) - see the website for details: https://www.[place]-.gov/departments/parks-recreation/active-adults50  -YouTube has lots of exercise videos for different ages and abilities as  well  -[facility] (a variety of indoor and outdoor inperson activities for adults). [phone]. [address]  -Virtual Online Classes (a variety of topics): see seniorplanet.org or call [phone]  -consider volunteering at a school, hospice center, church, senior center or elsewhere            Chiquita JONELLE Cramp, DO

## 2024-01-06 NOTE — Patient Instructions (Addendum)
 I really enjoyed getting to talk with you today! I am available on Tuesdays and Thursdays for virtual visits if you have any questions or concerns, or if I can be of any further assistance.   CHECKLIST FROM ANNUAL WELLNESS VISIT:  -Follow up (please call to schedule if not scheduled after visit):   -yearly for annual wellness visit with primary care office  Here is a list of your preventive care/health maintenance measures and the plan for each if any are due:  PLAN For any measures below that may be due:   Health Maintenance  Topic Date Due   Zoster Vaccines- Shingrix (1 of 2) 04/07/2024 (Originally 12/23/1950)   DTaP/Tdap/Td (4 - Td or Tdap) 01/05/2025 (Originally 08/20/2018)   Medicare Annual Wellness (AWV)  01/05/2025   Pneumococcal Vaccine: 50+ Years  Completed   Influenza Vaccine  Completed   Meningococcal B Vaccine  Aged Out   COVID-19 Vaccine  Discontinued    -See a dentist at least yearly  -Get your eyes checked and then per your eye specialist's recommendations  -Other issues addressed today:   1. Ask physical therapist to give you list of exercises to do at home. Try to get at least 20 minutes of exercise per day. This could significatly reduce your chances of falling.    -I have included below further information regarding a healthy whole foods based diet, physical activity guidelines for adults, stress management and opportunities for social connections. I hope you find this information useful.   -----------------------------------------------------------------------------------------------------------------------------------------------------------------------------------------------------------------------------------------------------------    NUTRITION: -eat real food: lots of colorful vegetables (half the plate) and fruits -5-7 servings of vegetables and fruits per day (fresh or steamed is best), exp. 2 servings of vegetables with lunch and dinner and 2  servings of fruit per day. Berries and greens such as kale and collards are great choices.  -consume on a regular basis:  fresh fruits, fresh veggies, fish, nuts, seeds, healthy oils (such as olive oil, avocado oil), whole grains (make sure for bread/pasta/crackers/etc., that the first ingredient on label contains the word whole), legumes. -can eat small amounts of dairy and lean meat (no larger than the palm of your hand), but avoid processed meats such as ham, bacon, lunch meat, etc. -drink water -try to avoid fast food and pre-packaged foods, processed meat, ultra processed foods/beverages (donuts, candy, etc.) -most experts advise limiting sodium to < 2300mg  per day, should limit further is any chronic conditions such as high blood pressure, heart disease, diabetes, etc. The American Heart Association advised that < 1500mg  is is ideal -try to avoid foods/beverages that contain any ingredients with names you do not recognize  -try to avoid foods/beverages  with added sugar or sweeteners/sweets  -try to avoid sweet drinks (including diet drinks): soda, juice, Gatorade, sweet tea, power drinks, diet drinks -try to avoid white rice, white bread, pasta (unless whole grain)  EXERCISE GUIDELINES FOR ADULTS: -if you wish to increase your physical activity, do so gradually and with the approval of your doctor -STOP and seek medical care immediately if you have any chest pain, chest discomfort or trouble breathing when starting or increasing exercise  -move and stretch your body, legs, feet and arms when sitting for long periods -Physical activity guidelines for optimal health in adults: -get at least 150 minutes per week of moderate exercise (can talk, but not sing); this is about 20-30 minutes of sustained activity 5-7 days per week or two 10-15 minute episodes of sustained activity 5-7 days per week -  do some muscle building/resistance training/strength training at least 2 days per week  -balance  exercises 3+ days per week:   Stand somewhere where you have something sturdy to hold onto if you lose balance    1) lift up on toes, then back down, start with 5x per day and work up to 20x   2) stand and lift one leg straight out to the side so that foot is a few inches of the floor, start with 5x each side and work up to 20x each side   3) stand on one foot, start with 5 seconds each side and work up to 20 seconds on each side  If you need ideas or help with getting more active:  -Silver sneakers https://tools.silversneakers.com  -Walk with a Doc: Http://www.duncan-williams.com/  -try to include resistance (weight lifting/strength building) and balance exercises twice per week: or the following link for ideas: http://castillo-powell.com/  buyducts.dk  STRESS MANAGEMENT: -can try meditating, or just sitting quietly with deep breathing while intentionally relaxing all parts of your body for 5 minutes daily -if you need further help with stress, anxiety or depression please follow up with your primary doctor or contact the wonderful folks at [facility]: [phone]  SOCIAL CONNECTIONS: -options in [place] if you wish to engage in more social and exercise related activities:  -Silver sneakers https://tools.silversneakers.com  -Walk with a Doc: Http://www.duncan-williams.com/  -Check out the [facility] Active Adults 50+ section on the [place] [employer] companies (hiking clubs, book clubs, cards and games, chess, exercise classes, aquatic classes and much more) - see the website for details: https://www.[place].gov/departments/parks-recreation/active-adults50  -YouTube has lots of exercise videos for different ages and abilities as well  -[facility] (a variety of indoor and outdoor inperson activities for adults). [phone]. [address]  -Virtual Online  Classes (a variety of topics): see seniorplanet.org or call [phone]  -consider volunteering at a school, hospice center, church, senior center or elsewhere

## 2024-01-09 ENCOUNTER — Other Ambulatory Visit: Payer: Self-pay | Admitting: Family Medicine

## 2024-01-09 DIAGNOSIS — E039 Hypothyroidism, unspecified: Secondary | ICD-10-CM

## 2024-01-12 ENCOUNTER — Ambulatory Visit

## 2024-01-12 DIAGNOSIS — R293 Abnormal posture: Secondary | ICD-10-CM | POA: Diagnosis present

## 2024-01-12 DIAGNOSIS — R296 Repeated falls: Secondary | ICD-10-CM | POA: Insufficient documentation

## 2024-01-12 DIAGNOSIS — M48062 Spinal stenosis, lumbar region with neurogenic claudication: Secondary | ICD-10-CM | POA: Diagnosis present

## 2024-01-12 DIAGNOSIS — M6281 Muscle weakness (generalized): Secondary | ICD-10-CM | POA: Insufficient documentation

## 2024-01-12 DIAGNOSIS — R2689 Other abnormalities of gait and mobility: Secondary | ICD-10-CM | POA: Insufficient documentation

## 2024-01-12 NOTE — Therapy (Signed)
 OUTPATIENT PHYSICAL THERAPY TREATMENT   Patient Name: Timothy Garrett MRN: [id number] DOB:1931-07-16, 89 y.o., male Today's Date: 01/12/2024  END OF SESSION:  PT End of Session - 01/12/24 1413     Visit Number 3    Date for Recertification  02/20/24    Authorization Type [employer] Medicare - no auth req    Progress Note Due on Visit 10    PT Start Time 1409   pt arrived late   PT Stop Time 1458    PT Time Calculation (min) 49 min    Activity Tolerance Patient tolerated treatment well    Behavior During Therapy WFL for tasks assessed/performed           Past Medical History:  Diagnosis Date   Arthritis    Diverticulitis    Diverticulitis    GERD (gastroesophageal reflux disease)    Heartburn   Hyperlipemia    Hypertension    IBS (irritable bowel syndrome)    Kidney stones    Past Surgical History:  Procedure Laterality Date   BACK SURGERY     CYSTOSCOPY/URETEROSCOPY/HOLMIUM LASER/STENT PLACEMENT Bilateral 05/03/2023   Procedure: CYSTOSCOPY/URETEROSCOPY/HOLMIUM LASER/STENT PLACEMENT;  Surgeon: Devere Lonni Righter, MD;  Location: [facility];  Service: Urology;  Laterality: Bilateral;   EYE SURGERY  2016   cataract surgery   TONSILLECTOMY  1941   ulcer surgery     Patient Active Problem List   Diagnosis Date Noted   Hydronephrosis of left kidney 05/03/2023   Nephrolithiasis 05/03/2023   AKI (acute kidney injury) 05/03/2023   Pneumonia due to COVID-19 virus 10/16/2022   Acute respiratory failure with hypoxia (HCC) 10/15/2022   CKD (chronic kidney disease) stage 3, GFR 30-59 ml/min (HCC) 08/25/2018   Hypertension 08/25/2018   BPH (benign prostatic hyperplasia) 02/24/2018   NECK PAIN 07/17/2009   CONTUSION OF MULTIPLE SITES NEC 07/17/2009   LACTOSE INTOLERANCE 06/14/2009   INTERNAL HEMORRHOIDS 06/14/2009   GERD 06/14/2009   DIVERTICULITIS, COLON 06/14/2009   IRRITABLE BOWEL SYNDROME 06/14/2009   PUD, HX OF 06/14/2009   History of colonic polyps 06/14/2009    NEPHROLITHIASIS, HX OF 06/14/2009   TRAUMATIC ARTHROPATHY SHOULDER REGION 12/06/2008   SPINAL STENOSIS, LUMBAR 12/06/2008   UNS ADVRS EFF UNS RX MEDICINAL&BIOLOGICAL SBSTNC 05/06/2007   NEOP, MALIGNANT, SKIN, FACE NEC 11/21/2006   Malignant neoplasm of skin of face 11/21/2006   Hypothyroidism 08/01/2006   Hyperlipidemia 08/01/2006   Diverticulosis of colon 08/01/2006    PCP: Wolm Scarlet, MD  REFERRING PROVIDER: Ozell Heron HERO, MD  REFERRING DIAG: W19.CHERENE (ICD-10-CM) - Fall, initial encounter R26.89 (ICD-10-CM) - Balance disorder M48.061 (ICD-10-CM) - Spinal stenosis, lumbar region, without neurogenic claudication  Rationale for Evaluation and Treatment: Rehabilitation  THERAPY DIAG:  Lumbar stenosis with neurogenic claudication  Repeated falls  Muscle weakness (generalized)  Abnormal posture  Other abnormalities of gait and mobility  ONSET DATE: chronic - several falls in past 3 mos  SUBJECTIVE:  SUBJECTIVE STATEMENT: I'm doing my exercises. I had a good Thanksgiving. Daughter reports pt uses a variety of walkers at home. Standard in the bathroom for at night and likes his rollator the best. Does well controlling this with hand brakes per her report.     Pt and his daughter Timothy Garrett present for appt.  Daughter reports increased falls over the past 3 mos.  He uses a walker in the home, has accommodations in bathroom with shower chair, grab bars, several placed walkers throughout home, handheld shower head.  Has higher toilets.  Has lumbar stenosis and legs have been more numb with Lt leg weakness and giving way.   Gets steroid injections every 3-4 mos which helps a little.   Lives in single level townhome with his wife.  Daughter Timothy Garrett lives 10 min away.  Her brother is also local [place]. Has falled 2x in last 2 weeks when up at night to go to the bathroom.  Doesn't remember second fall.  His wife took 30 min to get him up off floor.  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 Yes. 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  DIAGNOSTIC FINDINGS:  EXAM: 1 VIEW XRAY OF THE LEFT SHOULDER 12/17/2023    COMPARISON: X-ray left humerus 12/04/2008.   CLINICAL HISTORY: Left shoulder pain s/p fall.   FINDINGS:   BONES AND JOINTS: Mild glenohumeral joint osteoarthritis. Moderate acromioclavicular joint osteoarthritis. No acute fracture or dislocation.   SOFT TISSUES: No abnormal calcifications. Visualized lung is unremarkable.   IMPRESSION: 1. No acute fracture or dislocation.  PATIENT SURVEYS:  The Patient-Specific Functional Scale  Initial:  I am going to ask you to identify up to 3 important activities that you are unable to do or are having difficulty with as a result of this problem.  Today are there any activities that you are unable to do or having difficulty with because of this?   (Patient shown scale and patient rated each activity)  Follow up: When you first came in you had difficulty performing these activities.  Today do you still have difficulty?  Patient-Specific activity scoring scheme (Point to one number):  0 1 2 3 4 5 6 7 8 9  10 Unable  Able to perform To perform                                                                                                    activity at the same Activity         Level as before                                                                                                                       Injury or problem  Activity           Walking at home with walker                                                                       Initial:         7              follow up:  2.              Getting in/out of car                                                                     Initial:       7                follow up:   SCORE: 7      COGNITION: Overall cognitive status: Within functional limits for tasks assessed     SENSATION: Numb from feet to knees bil from stenosis  MUSCLE LENGTH: Not formally assessed but tight hip flexors secondary to flexed posture  POSTURE: rounded shoulders, forward head, increased thoracic kyphosis, and flexed trunk   PALPATION:   LUMBAR ROM:  Not formally assessed due to poor stability, lacks trunk extension to upright posture secondary to stenosis, lacks trunk rotation with functional mobility  LOWER EXTREMITY ROM:    WFL  LOWER EXTREMITY MMT:   Hips 4/5 bil Knees: quads 4+/5 bil, hamstrings 4-/5 bil Ankles: 3+/5 all directions bil    FUNCTIONAL TESTS:  5 times sit to stand: 20.35 with bil arm rests, trunk remains  flexed and Pt does not leg go of arm rests Timed up and go (TUG): 38.13 with 2-wheel walker  GAIT: Distance walked:  Assistive device utilized:  Single point cane and Walker - 2 wheeled Level of assistance: Modified independence Comments: Pt needs min/mod assist x1 with cane use, mod ind with walker  TREATMENT DATE:  01/12/24: Therapeutic Exercises NuStep: Level 5 (UE 10 / LE 8) x 7 minutes-PTA present to discuss progress with pt and his daughter Seated:   2# each ankle for: Long arc quads 5 sec holds, alt marching  2 x 12 each  Hip adduction with purple ball: 5 hold x20  Hip abduction with red loop x 25 Therapeutic Activities  In // bars with 2# on each ankle for following: Hip 3 way raises x 10 each with demo and VC's throughout for correct technique/erect posture Mini squat in bars with demo and VC's throughout, did not add to HEP due to difficulty with correct technique and relied on UE support on bars heavily due to technique, not leg strength. Worried he would fall backwards when trying this at home so advised him and daughter to not do this at home yet, they verbalized understanding Heel - toe raises 2 x 10 in // bars, demo and VC's for ankle ROM only and to limit weight shifting, he struggled with this but was able to show improved technique after multiple cues Wall Push Ups x 10 with therapist performing with him for demo, VC's to full ROM as able  12/29/23:  NuStep: level 5x 6 minutes-PT present to discuss progress  Seated:   Long arc quads, marching and heel/toe raises 2x10 bil each  Hip adduction with ball: 5# hold x20  Hip abduction with yellow loop x20  Sit to stand 2x5  Rows with red band 2x10 Standing hip abduction and extension 2x5 bil each Adjusted walker to be higher to encourage upright posture  12/26/23    Pt education:  consider getting bedside commode for multiple trips to bathroom each night to reduce risk of night-time falls     PT advised using walker full time vs option to use cane with min/mod A x1, Pt and his daughter agreeable Get a fall alert system in place to wear or have attached to walker in  home Initiated HEP                                                                                                                          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 Person educated: Patient and Child(ren) Education method: Demonstration, Verbal cues, and Handouts Education comprehension: verbalized understanding and returned demonstration, will benefit from further review  HOME EXERCISE PROGRAM: Access Code: [email or web address] URL: [email or web address] Date: 12/29/2023 Prepared by: Burnard  Exercises - Seated March  - 1 x daily - 7 x weekly - 2 sets - 20 reps - Seated Long Arc Quad  - 1 x daily - 7 x weekly - 2 sets -  10 reps - Seated Heel Toe Raises  - 2 x daily - 7 x weekly - 2 sets - 10 reps - Seated Isometric Hip Adduction with Ball  - 2 x daily - 7 x weekly - 2 sets - 10 reps - Seated Isometric Hip Abduction with Resistance  - 2 x daily - 7 x weekly - 2 sets - 10 reps  Access Code: [email or web address] URL: [email or web address] Date: 01/12/2024 Prepared by: Berwyn Knights  Exercises - Standing March with Counter Support  - 2 x daily - 7 x weekly - 1-2 sets - 10 reps - Standing Hip Abduction with Counter Support  - 2 x daily - 7 x weekly - 1-2 sets - 10 reps - Standing Hip Extension with Counter Support  - 2 x daily - 7 x weekly - 1-2 sets - 10 reps - Heel Toe Raises with Counter Support  - 2 x daily - 7 x weekly - 1-2 sets - 20 reps - Tricep Push Up on Wall  - 2 x daily - 7 x weekly - 2 sets - 10 reps  ASSESSMENT:  CLINICAL IMPRESSION: Progressed pt with ankle weights added to seated exs which he tolerated very well. Discussed with daughter that she can get him adjustable ankle weights for home use to progress as he's able to tolerate. Then progressed him with standing hip 3 way raises and added these to his HEP along with heel-toe raises and wall push ups. Pt did struggle with correct technique today even with demo and cuing throughout.  He will benefit from continued physical therapy at this time to progress his bil LE strength as tolerated and to ensure safety and proper technique with his HEP.   OBJECTIVE IMPAIRMENTS: Abnormal gait, decreased activity tolerance, decreased balance, decreased coordination, decreased endurance, decreased mobility, difficulty walking, decreased ROM, decreased strength, hypomobility, increased muscle spasms, impaired flexibility, impaired sensation, improper body mechanics, postural dysfunction, and pain.   ACTIVITY LIMITATIONS: lifting, bending, standing, squatting, stairs, transfers, dressing, and locomotion level  PARTICIPATION LIMITATIONS: community activity  PERSONAL FACTORS: Age and Time since onset of injury/illness/exacerbation are also affecting patient's functional outcome.   REHAB POTENTIAL: Excellent  CLINICAL DECISION MAKING: Stable/uncomplicated  EVALUATION COMPLEXITY: Low   GOALS: Goals reviewed with patient? Yes  SHORT TERM GOALS: Target date: 01/23/24  Pt will be ind with initial HEP Baseline: Goal status: INITIAL  2.  Pt will be compliant with use of walker full time (vs SPC + mod A x1 of family member for outings). Baseline:  Goal status: INITIAL  3.  Pt will report no falls at night with use of a bedside commode (PT advised getting one for nighttime safety for multiple bathroom trips per night). Baseline:  Goal status: INITIAL    LONG TERM GOALS: Target date: 02/20/24  Pt will be ind with targeted HEP and understand the importance of compliance to maintain gains made in PT. Baseline:  Goal status: INITIAL  2.  Pt will report no falls of near falls within episode of care Baseline:  Goal status: INITIAL  3.  Pt will perform 5x sit to stand in 16 sec or less with use of bil UE to demo reduced fall risk Baseline:  Goal status: INITIAL  4.  Pt will achieve at least 4/5 strength in bil ankles and knees to improve gait stability Baseline:  Goal status:  INITIAL  5.  Pt will perform TUG in </= 30 sec with walker to demo improved functional mobility and reduced fall  risk Baseline:  Goal status: INITIAL  6.  Pt will demo gait endurance by completing at least a using walker to improve tolerance of community outings. Baseline:  Goal status: INITIAL  7. PSFS score will improve to at least 8 for both categories (household mobility, getting in/out of car)  Goal status: INITIAL  PLAN:  PT FREQUENCY: 1-2x/week  PT DURATION: 8 weeks  PLANNED INTERVENTIONS: 97110-Therapeutic exercises, 97530- Therapeutic activity, 97112- Neuromuscular re-education, 97535- Self Care, [id number]- Manual therapy, [id number]- Gait training, [phone]- Aquatic Therapy, [phone]- Traction (mechanical), [phone] (1-2 muscles), [phone] (3+ muscles)- Dry Needling, Patient/Family education, Balance training, Stair training, Joint mobilization, Spinal mobilization, DME instructions, Cryotherapy, and Moist heat.  PLAN FOR NEXT SESSION: NuStep for endurance, ankle strength, review and progress seated HEP, Pt is at mod/high risk for falls.  Did Pt get bedside commode?  If brings rollator, assess 2 wheeled walker if he brings next time.   Berwyn Knights, PTA 01/12/24 4:45 PM

## 2024-01-14 ENCOUNTER — Ambulatory Visit

## 2024-01-14 DIAGNOSIS — R296 Repeated falls: Secondary | ICD-10-CM

## 2024-01-14 DIAGNOSIS — M48062 Spinal stenosis, lumbar region with neurogenic claudication: Secondary | ICD-10-CM

## 2024-01-14 DIAGNOSIS — M6281 Muscle weakness (generalized): Secondary | ICD-10-CM

## 2024-01-14 DIAGNOSIS — R293 Abnormal posture: Secondary | ICD-10-CM

## 2024-01-14 DIAGNOSIS — R2689 Other abnormalities of gait and mobility: Secondary | ICD-10-CM

## 2024-01-14 NOTE — Therapy (Signed)
 OUTPATIENT PHYSICAL THERAPY TREATMENT   Patient Name: Timothy Garrett MRN: [id number] DOB:Jan 15, 1932, 89 y.o., male Today's Date: 01/14/2024  END OF SESSION:  PT End of Session - 01/14/24 1711     Visit Number 4    Date for Recertification  02/20/24    Authorization Type BCBS Medicare - no auth req    Progress Note Due on Visit 10    PT Start Time 1621    PT Stop Time 1704    PT Time Calculation (min) 43 min    Activity Tolerance Patient tolerated treatment well            Past Medical History:  Diagnosis Date   Arthritis    Diverticulitis    Diverticulitis    GERD (gastroesophageal reflux disease)    Heartburn   Hyperlipemia    Hypertension    IBS (irritable bowel syndrome)    Kidney stones    Past Surgical History:  Procedure Laterality Date   BACK SURGERY     CYSTOSCOPY/URETEROSCOPY/HOLMIUM LASER/STENT PLACEMENT Bilateral 05/03/2023   Procedure: CYSTOSCOPY/URETEROSCOPY/HOLMIUM LASER/STENT PLACEMENT;  Surgeon: Devere Lonni Righter, MD;  Location: [facility];  Service: Urology;  Laterality: Bilateral;   EYE SURGERY  2016   cataract surgery   TONSILLECTOMY  1941   ulcer surgery     Patient Active Problem List   Diagnosis Date Noted   Hydronephrosis of left kidney 05/03/2023   Nephrolithiasis 05/03/2023   AKI (acute kidney injury) 05/03/2023   Pneumonia due to COVID-19 virus 10/16/2022   Acute respiratory failure with hypoxia (HCC) 10/15/2022   CKD (chronic kidney disease) stage 3, GFR 30-59 ml/min (HCC) 08/25/2018   Hypertension 08/25/2018   BPH (benign prostatic hyperplasia) 02/24/2018   NECK PAIN 07/17/2009   CONTUSION OF MULTIPLE SITES NEC 07/17/2009   LACTOSE INTOLERANCE 06/14/2009   INTERNAL HEMORRHOIDS 06/14/2009   GERD 06/14/2009   DIVERTICULITIS, COLON 06/14/2009   IRRITABLE BOWEL SYNDROME 06/14/2009   PUD, HX OF 06/14/2009   History of colonic polyps 06/14/2009   NEPHROLITHIASIS, HX OF 06/14/2009   TRAUMATIC ARTHROPATHY SHOULDER REGION  12/06/2008   SPINAL STENOSIS, LUMBAR 12/06/2008   UNS ADVRS EFF UNS RX MEDICINAL&BIOLOGICAL SBSTNC 05/06/2007   NEOP, MALIGNANT, SKIN, FACE NEC 11/21/2006   Malignant neoplasm of skin of face 11/21/2006   Hypothyroidism 08/01/2006   Hyperlipidemia 08/01/2006   Diverticulosis of colon 08/01/2006    PCP: Wolm Scarlet, MD  REFERRING PROVIDER: Ozell Heron HERO, MD  REFERRING DIAG: W19.CHERENE (ICD-10-CM) - Fall, initial encounter R26.89 (ICD-10-CM) - Balance disorder M48.061 (ICD-10-CM) - Spinal stenosis, lumbar region, without neurogenic claudication  Rationale for Evaluation and Treatment: Rehabilitation  THERAPY DIAG:  Lumbar stenosis with neurogenic claudication  Repeated falls  Muscle weakness (generalized)  Abnormal posture  Other abnormalities of gait and mobility  ONSET DATE: chronic - several falls in past 3 mos  SUBJECTIVE:  SUBJECTIVE STATEMENT: I've been doing my exercises.  I was sore after last session in my Lt core so I would like to do less intensity today.  Yesterday, the pain was >10/10     Pt and his daughter Arlean present for appt.  Daughter reports increased falls over the past 3 mos.  He uses a walker in the home, has accommodations in bathroom with shower chair, grab bars, several placed walkers throughout home, handheld shower head.  Has higher toilets.  Has lumbar stenosis and legs have been more numb with Lt leg weakness and giving way.   Gets steroid injections every 3-4 mos which helps a little.   Lives in single level townhome with his wife.  Daughter Arlean lives 10 min away.  Her brother is also local [place]. Has falled 2x in last 2 weeks when up at night to go to the bathroom.  Doesn't remember second fall.  His wife took 30 min to get him up off floor.  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 Yes. 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  DIAGNOSTIC FINDINGS:  EXAM: 1 VIEW XRAY OF THE LEFT SHOULDER 12/17/2023    COMPARISON: X-ray left humerus 12/04/2008.   CLINICAL HISTORY: Left shoulder pain s/p fall.   FINDINGS:   BONES AND JOINTS: Mild glenohumeral joint osteoarthritis. Moderate acromioclavicular joint osteoarthritis. No acute fracture or dislocation.   SOFT TISSUES: No abnormal calcifications. Visualized lung is unremarkable.   IMPRESSION: 1. No acute fracture or dislocation.  PATIENT SURVEYS:  The Patient-Specific Functional Scale  Initial:  I am going to ask you to identify up to 3 important activities that you are unable to do or are having difficulty with as a result of this problem.  Today are there any activities that you are unable to do or having difficulty with because of this?  (Patient shown scale and patient rated each activity)  Follow up: When you first came in you had difficulty performing these activities.  Today do you still  have difficulty?  Patient-Specific activity scoring scheme (Point to one number):  0 1 2 3 4 5 6 7 8 9  10 Unable  Able to perform To perform                                                                                                    activity at the same Activity         Level as before                                                                                                                       Injury or problem  Activity           Walking at home with walker                                                                       Initial:         7              follow up:  2.              Getting in/out of car                                                                     Initial:       7                follow up:   SCORE: 7   COGNITION: Overall cognitive status: Within functional limits for tasks assessed     SENSATION: Numb from feet to knees bil from stenosis  MUSCLE LENGTH: Not formally assessed but tight hip flexors secondary to flexed posture  POSTURE: rounded shoulders, forward head, increased thoracic kyphosis, and flexed trunk   PALPATION:   LUMBAR ROM:  Not formally assessed due to poor stability, lacks trunk extension to upright posture secondary to stenosis, lacks trunk rotation with functional mobility  LOWER EXTREMITY ROM:    WFL  LOWER EXTREMITY MMT:   Hips 4/5 bil Knees: quads 4+/5 bil, hamstrings 4-/5 bil Ankles: 3+/5 all directions bil    FUNCTIONAL TESTS:  5 times sit to stand: 20.35 with bil arm rests, trunk remains flexed and Pt  does not leg go of arm rests Timed up and go (TUG): 38.13 with 2-wheel walker  GAIT: Distance walked:  Assistive device utilized: Single point cane and Walker - 2 wheeled Level of assistance: Modified independence Comments: Pt needs min/mod assist x1 with cane use, mod ind with  walker  TREATMENT DATE:  01/14/24: Therapeutic Exercises NuStep: Level 5 (UE 10 / LE 8) x 8 minutes-PT present to discuss progress with pt and his daughter Seated:   2# each ankle for: Long arc quads 5 sec holds   2 x 12 each  Hip adduction with purple ball: 5 hold x20  Row with red band 2x10  ER with 2# weight x15 each    Hamstring curl with red loo 2x10 bil each Therapeutic Activities/Neuro reeducation  At barre:  Sidestepping x 3 laps  Stepping over hurdles with step to gait x2 laps (3 hurdles)- UE support on barre  Weight shifting on balance pad: side to side x 1 min  Alternating step taps 6 step x20 with Rt UE support on barre   01/12/24: Therapeutic Exercises NuStep: Level 5 (UE 10 / LE 8) x 7 minutes-PTA present to discuss progress with pt and his daughter Seated:   2# each ankle for: Long arc quads 5 sec holds, alt marching  2 x 12 each  Hip adduction with purple ball: 5 hold x20  Hip abduction with red loop x 25 Therapeutic Activities  In // bars with 2# on each ankle for following: Hip 3 way raises x 10 each with demo and VC's throughout for correct technique/erect posture Mini squat in bars with demo and VC's throughout, did not add to HEP due to difficulty with correct technique and relied on UE support on bars heavily due to technique, not leg strength. Worried he would fall backwards when trying this at home so advised him and daughter to not do this at home yet, they verbalized understanding Heel - toe raises 2 x 10 in // bars, demo and VC's for ankle ROM only and to limit weight shifting, he struggled with this but was able to show improved technique after multiple cues Wall Push Ups x 10 with therapist performing with him for demo, VC's to full ROM as able  12/29/23:  NuStep: level 5x 6 minutes-PT present to discuss progress  Seated:   Long arc quads, marching and heel/toe raises 2x10 bil each  Hip adduction with ball: 5# hold x20  Hip abduction with yellow  loop x20  Sit to stand 2x5  Rows with red band 2x10 Standing hip abduction and extension 2x5 bil each Adjusted walker to be higher to encourage upright posture                                                                                                                       PATIENT EDUCATION:  Education details: [email or web address] Person educated: Patient and Child(ren) Education method: Demonstration, Verbal cues, and Handouts Education comprehension: verbalized understanding and returned demonstration, will  benefit from further review  HOME EXERCISE PROGRAM: Access Code: [email or web address] URL: [email or web address] Date: 12/29/2023 Prepared by: Burnard  Exercises - Seated March  - 1 x daily - 7 x weekly - 2 sets - 20 reps - Seated Long Arc Quad  - 1 x daily - 7 x weekly - 2 sets - 10 reps - Seated Heel Toe Raises  - 2 x daily - 7 x weekly - 2 sets - 10 reps - Seated Isometric Hip Adduction with Ball  - 2 x daily - 7 x weekly - 2 sets - 10 reps - Seated Isometric Hip Abduction with Resistance  - 2 x daily - 7 x weekly - 2 sets - 10 reps  Access Code: [email or web address] URL: [email or web address] Date: 01/12/2024 Prepared by: Berwyn Knights  Exercises - Standing March with Counter Support  - 2 x daily - 7 x weekly - 1-2 sets - 10 reps - Standing Hip Abduction with Counter Support  - 2 x daily - 7 x weekly - 1-2 sets - 10 reps - Standing Hip Extension with Counter Support  - 2 x daily - 7 x weekly - 1-2 sets - 10 reps - Heel Toe Raises with Counter Support  - 2 x daily - 7 x weekly - 1-2 sets - 20 reps - Tricep Push Up on Wall  - 2 x daily - 7 x weekly - 2 sets - 10 reps  ASSESSMENT:  CLINICAL IMPRESSION: Pt had increased pain after last session in the Lt flank/hip flexor and asked to scale back exercises.  He did well today and was challenged by balance exercises and required close supervision and guard.  He brings a walker without wheels to PT because it folds up.   PT educated pt and his daughter that they can change the front legs to wheels.  He will benefit from continued physical therapy at this time to progress his bil LE strength as tolerated and to ensure safety and proper technique with his HEP.   OBJECTIVE IMPAIRMENTS: Abnormal gait, decreased activity tolerance, decreased balance, decreased coordination, decreased endurance, decreased mobility, difficulty walking, decreased ROM, decreased strength, hypomobility, increased muscle spasms, impaired flexibility, impaired sensation, improper body mechanics, postural dysfunction, and pain.   ACTIVITY LIMITATIONS: lifting, bending, standing, squatting, stairs, transfers, dressing, and locomotion level  PARTICIPATION LIMITATIONS: community activity  PERSONAL FACTORS: Age and Time since onset of injury/illness/exacerbation are also affecting patient's functional outcome.   REHAB POTENTIAL: Excellent  CLINICAL DECISION MAKING: Stable/uncomplicated  EVALUATION COMPLEXITY: Low   GOALS: Goals reviewed with patient? Yes  SHORT TERM GOALS: Target date: 01/23/24  Pt will be ind with initial HEP Baseline: Goal status: MET  2.  Pt will be compliant with use of walker full time (vs SPC + mod A x1 of family member for outings). Baseline: 01/14/24 Goal status: MET  3.  Pt will report no falls at night with use of a bedside commode (PT advised getting one for nighttime safety for multiple bathroom trips per night). Baseline:  Goal status: INITIAL    LONG TERM GOALS: Target date: 02/20/24  Pt will be ind with targeted HEP and understand the importance of compliance to maintain gains made in PT. Baseline:  Goal status: INITIAL  2.  Pt will report no falls of near falls within episode of care Baseline:  Goal status: INITIAL  3.  Pt will perform 5x sit to stand in 16 sec or less with use of bil UE to demo reduced fall risk Baseline:  Goal status: INITIAL  4.  Pt will achieve at least 4/5 strength  in bil ankles and knees to improve gait stability Baseline:  Goal status: INITIAL  5.  Pt will perform TUG in </= 30 sec with walker to demo improved functional mobility and reduced fall risk Baseline:  Goal status: INITIAL  6.  Pt will demo gait endurance by completing at least a using walker to improve tolerance of community outings. Baseline:  Goal status: INITIAL  7. PSFS score will improve to at least 8 for both categories (household mobility, getting in/out of car)  Goal status: INITIAL  PLAN:  PT FREQUENCY: 1-2x/week  PT DURATION: 8 weeks  PLANNED INTERVENTIONS: 97110-Therapeutic exercises, 97530- Therapeutic activity, 97112- Neuromuscular re-education, 97535- Self Care, 02859- Manual therapy, [id number]- Gait training, [phone]- Aquatic Therapy, [phone]- Traction (mechanical), [phone] (1-2 muscles), 20561 (3+ muscles)- Dry Needling, Patient/Family education, Balance training, Stair training, Joint mobilization, Spinal mobilization, DME instructions, Cryotherapy, and Moist heat.  PLAN FOR NEXT SESSION: NuStep for endurance, ankle strength, review and progress seated HEP, Pt is at mod/high risk for falls.  Burnard Joy, PT 01/14/24 5:12 PM

## 2024-01-19 ENCOUNTER — Ambulatory Visit

## 2024-01-20 ENCOUNTER — Ambulatory Visit: Payer: Self-pay

## 2024-01-20 NOTE — Telephone Encounter (Signed)
  FYI Only or Action Required?: Action required by provider: request for appointment.  Patient was last seen in primary care on 01/06/2024 by Luke Chiquita SAUNDERS, DO.  Called Nurse Triage reporting Abdominal Pain.  Symptoms began a week ago.  Interventions attempted: Nothing.  Symptoms are: gradually worsening.Pain middle of stomach. Was worse last night.No other symptoms.  Triage Disposition: See Physician Within 24 Hours  Patient/caregiver understands and will follow disposition?: Yes     Copied from CRM #[id number]. Topic: Clinical - Red Word Triage >> Jan 20, 2024  9:48 AM Timothy Garrett wrote: Red Word that prompted transfer to Nurse Triage: week ago abdominal pain thought was due to PT , but has gotten worse . Reason for Disposition  [1] MODERATE pain (e.g., interferes with normal activities) AND [2] pain comes and goes (cramps) AND [3] present > 24 hours  (Exception: Pain with Vomiting or Diarrhea - see that Guideline.)  Answer Assessment - Initial Assessment Questions 1. LOCATION: Where does it hurt?      Middle 2. RADIATION: Does the pain shoot anywhere else? (e.g., chest, back)     no 3. ONSET: When did the pain begin? (Minutes, hours or days ago)      Last week 4. SUDDEN: Gradual or sudden onset?     gradual 5. PATTERN Does the pain come and go, or is it constant?     Comes and goes 6. SEVERITY: How bad is the pain?  (e.g., Scale 1-10; mild, moderate, or severe)     severe 7. RECURRENT SYMPTOM: Have you ever had this type of stomach pain before? If Yes, ask: When was the last time? and What happened that time?      yes 8. CAUSE: What do you think is causing the stomach pain? (e.g., gallstones, recent abdominal surgery)     unsure 9. RELIEVING/AGGRAVATING FACTORS: What makes it better or worse? (e.g., antacids, bending or twisting motion, bowel movement)     no 10. OTHER SYMPTOMS: Do you have any other symptoms? (e.g., back pain, diarrhea, fever,  urination pain, vomiting)       no  Protocols used: Abdominal Pain - Male-A-AH

## 2024-01-21 ENCOUNTER — Ambulatory Visit

## 2024-01-21 ENCOUNTER — Ambulatory Visit: Admitting: Family Medicine

## 2024-01-21 ENCOUNTER — Ambulatory Visit: Payer: Self-pay

## 2024-01-21 DIAGNOSIS — R1032 Left lower quadrant pain: Secondary | ICD-10-CM | POA: Diagnosis not present

## 2024-01-21 DIAGNOSIS — K5792 Diverticulitis of intestine, part unspecified, without perforation or abscess without bleeding: Secondary | ICD-10-CM | POA: Diagnosis not present

## 2024-01-21 NOTE — Telephone Encounter (Signed)
 FYI Only or Action Required?: FYI only for provider: Going to UC, declined OV tomorrow.  Patient was last seen in primary care on 01/06/2024 by Luke Chiquita SAUNDERS, DO.  Called Nurse Triage reporting Abdominal Pain.  Symptoms began a week ago.  Interventions attempted: Nothing.  Symptoms are: stable.  Triage Disposition: Information or Advice Only Call, See Physician Within 24 Hours  Patient/caregiver understands and will follow disposition?: Yes Reason for Disposition  [1] MODERATE pain (e.g., interferes with normal activities) AND [2] pain comes and goes (cramps) AND [3] present > 24 hours  (Exception: Pain with Vomiting or Diarrhea - see that Guideline.)  Health information question, no triage required and triager able to answer question  Answer Assessment - Initial Assessment Questions They canceled patient's appointment today as abd pain subsided but now is back. Advised there is no available appointments today in PCP or other local offices. Offered first available tomorrow morning, daughter declined and stated she will take patient to UC.   1. REASON FOR CALL: What is the main reason for your call? or How can I best help you?     Patient's daughter Timothy Garrett calling back to schedule appointment  2. SYMPTOMS : Do you have any symptoms?      Abd pain, not new or worsening. Mild constipation  Protocols used: Abdominal Pain - Male-A-AH, Information Only Call - No Triage-A-AH  Copied from CRM #[id number]. Topic: Clinical - Red Word Triage >> Jan 21, 2024 11:18 AM Adelita E wrote: [facility] that prompted transfer to Nurse Triage: Stomach pain, with troubles having a bowel movement. Daughter, Timothy Garrett, on the line.

## 2024-01-22 ENCOUNTER — Other Ambulatory Visit: Payer: Self-pay | Admitting: Family Medicine

## 2024-01-22 DIAGNOSIS — E039 Hypothyroidism, unspecified: Secondary | ICD-10-CM

## 2024-01-26 ENCOUNTER — Ambulatory Visit

## 2024-01-28 ENCOUNTER — Ambulatory Visit

## 2024-01-28 ENCOUNTER — Other Ambulatory Visit: Payer: Self-pay | Admitting: Family Medicine

## 2024-01-28 DIAGNOSIS — E039 Hypothyroidism, unspecified: Secondary | ICD-10-CM

## 2024-02-09 ENCOUNTER — Ambulatory Visit

## 2024-02-09 ENCOUNTER — Ambulatory Visit: Payer: Self-pay

## 2024-02-09 NOTE — Telephone Encounter (Signed)
 FYI Only or Action Required?: FYI only for provider: appointment scheduled on 02/10/24 at [facility].  Patient was last seen in primary care on 01/06/2024 by Luke Chiquita SAUNDERS, DO.  Called Nurse Triage reporting Abdominal Pain.  Symptoms began several days ago.  Interventions attempted: Nothing.  Symptoms are: unchanged.  Triage Disposition: See Physician Within 24 Hours  Patient/caregiver understands and will follow disposition?: Yes  Copied from CRM #[id number]. Topic: Clinical - Red Word Triage >> Feb 09, 2024  8:34 AM Larissa RAMAN wrote: [facility] that prompted transfer to Nurse Triage: abdominal pain Answer Assessment - Initial Assessment Questions 1. LOCATION: Where does it hurt?      Unknown, area of recent diverticulitis flare 2. RADIATION: Does the pain shoot anywhere else? (e.g., chest, back)     unknown 3. ONSET: When did the pain begin? (Minutes, hours or days ago)      2-3 days ago 4. SUDDEN: Gradual or sudden onset?     unknown 5. PATTERN Does the pain come and go, or is it constant?     unknown 6. SEVERITY: How bad is the pain?  (e.g., Scale 1-10; mild, moderate, or severe)     Moderate (discomfort) 7. RECURRENT SYMPTOM: Have you ever had this type of stomach pain before? If Yes, ask: When was the last time? and What happened that time?      Yes, recently treated for diverticulitis flare 8. CAUSE: What do you think is causing the stomach pain? (e.g., gallstones, recent abdominal surgery)     Diverticulitis flare, concern for eating popcorn 9. RELIEVING/AGGRAVATING FACTORS: What makes it better or worse? (e.g., antacids, bending or twisting motion, bowel movement)     unsure 10. OTHER SYMPTOMS: Do you have any other symptoms? (e.g., back pain, diarrhea, fever, urination pain, vomiting)       Unknown  Protocols used: Abdominal Pain - Male-A-AH  Reason for Disposition  [1] MODERATE pain (e.g., interferes with normal activities) AND [2] pain comes  and goes (cramps) AND [3] present > 24 hours  (Exception: Pain with Vomiting or Diarrhea - see that Guideline.)  Answer Assessment - Initial Assessment Questions 1. LOCATION: Where does it hurt?      Unknown, area of recent diverticulitis flare 2. RADIATION: Does the pain shoot anywhere else? (e.g., chest, back)     unknown 3. ONSET: When did the pain begin? (Minutes, hours or days ago)      2-3 days ago 4. SUDDEN: Gradual or sudden onset?     unknown 5. PATTERN Does the pain come and go, or is it constant?     unknown 6. SEVERITY: How bad is the pain?  (e.g., Scale 1-10; mild, moderate, or severe)     Moderate (discomfort) 7. RECURRENT SYMPTOM: Have you ever had this type of stomach pain before? If Yes, ask: When was the last time? and What happened that time?      Yes, recently treated for diverticulitis flare 8. CAUSE: What do you think is causing the stomach pain? (e.g., gallstones, recent abdominal surgery)     Diverticulitis flare, concern for eating popcorn 9. RELIEVING/AGGRAVATING FACTORS: What makes it better or worse? (e.g., antacids, bending or twisting motion, bowel movement)     unsure 10. OTHER SYMPTOMS: Do you have any other symptoms? (e.g., back pain, diarrhea, fever, urination pain, vomiting)       Unknown  Protocols used: Abdominal Pain - Male-A-AH  Reason for Disposition  [1] MODERATE pain (e.g., interferes with normal activities) AND [2]  pain comes and goes (cramps) AND [3] present > 24 hours  (Exception: Pain with Vomiting or Diarrhea - see that Guideline.)  Answer Assessment - Initial Assessment Questions Patient with recent diverticulitis flare, treated with antibiotics that were not completed on time/as prescribed  1. LOCATION: Where does it hurt?      Unknown, area of recent diverticulitis flare 2. RADIATION: Does the pain shoot anywhere else? (e.g., chest, back)     unknown 3. ONSET: When did the pain begin? (Minutes, hours  or days ago)      2-3 days ago 4. SUDDEN: Gradual or sudden onset?     unknown 5. PATTERN Does the pain come and go, or is it constant?     unknown 6. SEVERITY: How bad is the pain?  (e.g., Scale 1-10; mild, moderate, or severe)     Moderate (discomfort) 7. RECURRENT SYMPTOM: Have you ever had this type of stomach pain before? If Yes, ask: When was the last time? and What happened that time?      Yes, recently treated for diverticulitis flare 8. CAUSE: What do you think is causing the stomach pain? (e.g., gallstones, recent abdominal surgery)     Diverticulitis flare, concern for eating popcorn 9. RELIEVING/AGGRAVATING FACTORS: What makes it better or worse? (e.g., antacids, bending or twisting motion, bowel movement)     unsure 10. OTHER SYMPTOMS: Do you have any other symptoms? (e.g., back pain, diarrhea, fever, urination pain, vomiting)       Unknown  Protocols used: Abdominal Pain - Male-A-AH

## 2024-02-10 ENCOUNTER — Ambulatory Visit: Admitting: Student in an Organized Health Care Education/Training Program

## 2024-02-10 VITALS — BP 121/64 | HR 69 | Wt 145.0 lb

## 2024-02-10 DIAGNOSIS — K5792 Diverticulitis of intestine, part unspecified, without perforation or abscess without bleeding: Secondary | ICD-10-CM | POA: Diagnosis not present

## 2024-02-10 DIAGNOSIS — N1832 Chronic kidney disease, stage 3b: Secondary | ICD-10-CM

## 2024-02-10 LAB — COMPREHENSIVE METABOLIC PANEL WITH GFR
ALT: 15 U/L (ref 3–53)
AST: 16 U/L (ref 5–37)
Albumin: 4.4 g/dL (ref 3.5–5.2)
Alkaline Phosphatase: 71 U/L (ref 39–117)
BUN: 16 mg/dL (ref 6–23)
CO2: 30 meq/L (ref 19–32)
Calcium: 9.5 mg/dL (ref 8.4–10.5)
Chloride: 104 meq/L (ref 96–112)
Creatinine, Ser: 1.25 mg/dL (ref 0.40–1.50)
GFR: 50.83 mL/min — ABNORMAL LOW
Glucose, Bld: 95 mg/dL (ref 70–99)
Potassium: 4.3 meq/L (ref 3.5–5.1)
Sodium: 143 meq/L (ref 135–145)
Total Bilirubin: 0.8 mg/dL (ref 0.2–1.2)
Total Protein: 7.2 g/dL (ref 6.0–8.3)

## 2024-02-10 LAB — CBC WITH DIFFERENTIAL/PLATELET
Basophils Absolute: 0.1 K/uL (ref 0.0–0.1)
Basophils Relative: 1.3 % (ref 0.0–3.0)
Eosinophils Absolute: 0.4 K/uL (ref 0.0–0.7)
Eosinophils Relative: 4.1 % (ref 0.0–5.0)
HCT: 40.5 % (ref 39.0–52.0)
Hemoglobin: 13.5 g/dL (ref 13.0–17.0)
Lymphocytes Relative: 14 % (ref 12.0–46.0)
Lymphs Abs: 1.2 K/uL (ref 0.7–4.0)
MCHC: 33.3 g/dL (ref 30.0–36.0)
MCV: 101.6 fl — ABNORMAL HIGH (ref 78.0–100.0)
Monocytes Absolute: 0.8 K/uL (ref 0.1–1.0)
Monocytes Relative: 9.6 % (ref 3.0–12.0)
Neutro Abs: 6 K/uL (ref 1.4–7.7)
Neutrophils Relative %: 71 % (ref 43.0–77.0)
Platelets: 282 K/uL (ref 150.0–400.0)
RBC: 3.99 Mil/uL — ABNORMAL LOW (ref 4.22–5.81)
RDW: 14.3 % (ref 11.5–15.5)
WBC: 8.5 K/uL (ref 4.0–10.5)

## 2024-02-10 MED ORDER — METRONIDAZOLE 500 MG PO TABS: 500.0000 mg | ORAL_TABLET | Freq: Three times a day (TID) | ORAL | 0 refills | Status: AC

## 2024-02-10 MED ORDER — CIPROFLOXACIN HCL 250 MG PO TABS: 250.0000 mg | ORAL_TABLET | Freq: Two times a day (BID) | ORAL | 0 refills | Status: AC

## 2024-02-10 NOTE — Progress Notes (Signed)
 "  Acute Office Visit  Patient ID: Timothy Garrett, male    DOB: [birth year], 89 y.o.   MRN: [id number]  PCP: Timothy Garrett ORN, MD  Chief Complaint  Patient presents with   Abdominal Pain    Was seen on 12/10 at urgent care and it shows he was diagnosed with diverticulitis and was place on antibiotics and stopped 3 days and started again on the 12/18.     Subjective:     HPI  Discussed the use of AI scribe software for clinical note transcription with the patient, who gave verbal consent to proceed.  History of Present Illness Timothy Garrett is a 89 year old male with diverticulitis who presents with abdominal pain. He is accompanied by his daughter, Timothy Garrett.  He has been experiencing abdominal pain for about a week, with the pain becoming severe enough to disturb his sleep approximately three to four days ago. The pain is described as sore and tender, particularly on the left side of the abdomen, and is intermittent, easing off at times but then returning. No fever, diarrhea, blood in stool, or vomiting. He is eating and drinking well.  He was seen at urgent care with [facility], diagnosed with acute diverticulitis based on history and exam, CT scan was not done.  He was prescribed Augmentin  on December 10th but did not take it for several days, resuming on December 18th and completing the course. Despite this, the abdominal pain has persisted.  CT of the abdomen in March 2025 did show diverticulosis throughout the left-sided colon.  His past medical history includes diverticulitis, kidney stones earlier this year, recurrent UTIs, and a severe case of COVID-19 in September of the previous year. He underwent surgery for ulcers in his early thirties. He is currently in a physical therapy program for stenosis, which was paused due to the recent increase in pain.      Objective:    BP 121/64   Pulse 69   Wt 145 lb (65.8 kg)   SpO2 99%   BMI 21.41 kg/m   Physical Exam  Gen:  Frail appearing older man Heart: Regular, 2 out of 6 early stock murmur at the left upper sternal border Abd: Soft, moderately tender to palpation in the left lower quadrants with unintentional guarding, no tenderness in the other quadrants, no rebound tenderness. Ext: Warm, no edema Skin: Diffuse senile purpura.      Assessment & Plan:   Problem List Items Addressed This Visit       Unprioritized   CKD stage 3b, GFR 30-44 ml/min (HCC)   Last estimated GFR was 40 in March.  Will recheck labs today.  Also will check a cystatin C for better estimation of the GFR, I suspect it is probably lower given his sarcopenia and advanced age.  Would like to get a better estimation of the GFR to assess risk of doing a contrasted CT in the setting of acute diverticulitis.      Relevant Orders   Cystatin C with Glomerular Filtration Rate, Estimated (eGFR)   Acute diverticulitis - Primary   Symptoms today of left lower quadrant tenderness and guarding are consistent with mild peritoneal signs, most likely etiology is from acute diverticulitis.  This was partially treated earlier in the month with a course of Augmentin , there was some interruption in that medication because of memory issues.  He is otherwise doing well, good hydration, eating and drinking okay, no systemic symptoms.  Given his advanced age and  frailty, we talked about the risks of progression.  I recommended restarting antibiotics, but this time with ciprofloxacin  dose reduced at 250 mg twice daily and metronidazole  500 mg 3 times daily.  We talked about the risks of flouroquinolones in elderly people, but in this case I think the benefits outweigh the risks given his mild peritoneal signs.  Will check blood work today to look at his white count and his renal function.  We talked about a CT scan with contrast to confirm the diagnosis and rule out complications like intra-abdominal abscess.  We all agree we want to avoid hospitalization and  procedures if possible.  I will follow-up with the patient tomorrow, I think if blood work shows high degree of inflammation or his discomfort is getting worse, will get a CT scan.  We talked about reasons to go to the emergency department.  Will also need to reassess his kidney function to see if he would be safe to receive IV contrast with the CT scan.  Given his advanced age, frailty, and comorbidities, he is at risk for a poor outcome despite our best efforts.      Relevant Medications   ciprofloxacin  (CIPRO ) 250 MG tablet   metroNIDAZOLE  (FLAGYL ) 500 MG tablet   Other Relevant Orders   CBC with Differential/Platelet   Comprehensive metabolic panel with GFR    Meds ordered this encounter  Medications   ciprofloxacin  (CIPRO ) 250 MG tablet    Sig: Take 1 tablet (250 mg total) by mouth 2 (two) times daily for 7 days.    Dispense:  14 tablet    Refill:  0   metroNIDAZOLE  (FLAGYL ) 500 MG tablet    Sig: Take 1 tablet (500 mg total) by mouth 3 (three) times daily for 7 days.    Dispense:  21 tablet    Refill:  0    Cleatus Debby Specking, MD [facility]   "

## 2024-02-10 NOTE — Assessment & Plan Note (Signed)
 Last estimated GFR was 40 in March.  Will recheck labs today.  Also will check a cystatin C for better estimation of the GFR, I suspect it is probably lower given his sarcopenia and advanced age.  Would like to get a better estimation of the GFR to assess risk of doing a contrasted CT in the setting of acute diverticulitis.

## 2024-02-10 NOTE — Patient Instructions (Signed)
" °  VISIT SUMMARY: You visited us  today due to abdominal pain that has been troubling you for about a week. The pain has been particularly severe over the last few days, affecting your sleep. You have a history of diverticulitis, and despite completing a course of antibiotics, the pain has persisted. We discussed your symptoms and reviewed your medical history, including your recent physical therapy for stenosis and past issues with kidney stones, UTIs, and COVID-19.  YOUR PLAN: -ACUTE DIVERTICULITIS OF THE COLON: Diverticulitis is a condition where small pouches in the colon become inflamed or infected. Given your age and overall health, we are opting for medical management rather than surgical intervention. You have been prescribed two antibiotics: one to be taken once daily and another twice daily for one week. It is important to complete the full course of antibiotics. Please monitor for any signs of worsening infection, such as severe abdominal pain, inability to eat or drink, or feeling lightheaded. Contact our office if your symptoms worsen or if you have any concerns.  INSTRUCTIONS: Please follow the prescribed antibiotic regimen carefully. If you experience severe abdominal pain, inability to eat or drink, or lightheadedness, contact our office immediately. Additionally, avoid consuming foods that may aggravate your condition, such as popcorn. Continue to monitor your symptoms and keep us  informed of any changes.  "

## 2024-02-10 NOTE — Assessment & Plan Note (Addendum)
 Symptoms today of left lower quadrant tenderness and guarding are consistent with mild peritoneal signs, most likely etiology is from acute diverticulitis.  This was partially treated earlier in the month with a course of Augmentin , there was some interruption in that medication because of memory issues.  He is otherwise doing well, good hydration, eating and drinking okay, no systemic symptoms.  Given his advanced age and frailty, we talked about the risks of progression.  I recommended restarting antibiotics, but this time with ciprofloxacin  dose reduced at 250 mg twice daily and metronidazole  500 mg 3 times daily.  We talked about the risks of flouroquinolones in elderly people, but in this case I think the benefits outweigh the risks given his mild peritoneal signs.  Will check blood work today to look at his white count and his renal function.  We talked about a CT scan with contrast to confirm the diagnosis and rule out complications like intra-abdominal abscess.  We all agree we want to avoid hospitalization and procedures if possible.  I will follow-up with the patient tomorrow, I think if blood work shows high degree of inflammation or his discomfort is getting worse, will get a CT scan.  We talked about reasons to go to the emergency department.  Will also need to reassess his kidney function to see if he would be safe to receive IV contrast with the CT scan.  Given his advanced age, frailty, and comorbidities, he is at risk for a poor outcome despite our best efforts.

## 2024-02-12 LAB — CYSTATIN C WITH GLOMERULAR FILTRATION RATE, ESTIMATED (EGFR)
CYSTATIN C: 1.76 mg/L
eGFR: 33 mL/min/1.73m2 — ABNORMAL LOW

## 2024-02-13 ENCOUNTER — Ambulatory Visit: Payer: Self-pay | Admitting: Student in an Organized Health Care Education/Training Program

## 2024-02-13 ENCOUNTER — Other Ambulatory Visit: Payer: Self-pay | Admitting: Family Medicine

## 2024-02-13 DIAGNOSIS — E039 Hypothyroidism, unspecified: Secondary | ICD-10-CM

## 2024-02-16 ENCOUNTER — Ambulatory Visit: Attending: Family Medicine

## 2024-02-16 ENCOUNTER — Other Ambulatory Visit: Payer: Self-pay | Admitting: Family Medicine

## 2024-02-16 DIAGNOSIS — R296 Repeated falls: Secondary | ICD-10-CM | POA: Insufficient documentation

## 2024-02-16 DIAGNOSIS — R2689 Other abnormalities of gait and mobility: Secondary | ICD-10-CM | POA: Insufficient documentation

## 2024-02-16 DIAGNOSIS — M6281 Muscle weakness (generalized): Secondary | ICD-10-CM | POA: Insufficient documentation

## 2024-02-16 DIAGNOSIS — R293 Abnormal posture: Secondary | ICD-10-CM | POA: Insufficient documentation

## 2024-02-16 DIAGNOSIS — M48062 Spinal stenosis, lumbar region with neurogenic claudication: Secondary | ICD-10-CM | POA: Diagnosis present

## 2024-02-16 NOTE — Therapy (Signed)
 " OUTPATIENT PHYSICAL THERAPY TREATMENT   Patient Name: Timothy Garrett MRN: [id number] DOB:[birth year], 89 y.o., male Today's Date: 02/16/2024  END OF SESSION:  PT End of Session - 02/16/24 1448     Visit Number 5    Date for Recertification  02/20/24    Authorization Type BCBS Medicare - no auth req    Progress Note Due on Visit 10    PT Start Time 1401    PT Stop Time 1446    PT Time Calculation (min) 45 min    Activity Tolerance Patient tolerated treatment well    Behavior During Therapy WFL for tasks assessed/performed             Past Medical History:  Diagnosis Date   Arthritis    Diverticulitis    Diverticulitis    GERD (gastroesophageal reflux disease)    Heartburn   Hyperlipemia    Hypertension    IBS (irritable bowel syndrome)    Kidney stones    Past Surgical History:  Procedure Laterality Date   BACK SURGERY     CYSTOSCOPY/URETEROSCOPY/HOLMIUM LASER/STENT PLACEMENT Bilateral 05/03/2023   Procedure: CYSTOSCOPY/URETEROSCOPY/HOLMIUM LASER/STENT PLACEMENT;  Surgeon: Devere Lonni Righter, MD;  Location: [facility];  Service: Urology;  Laterality: Bilateral;   EYE SURGERY  2016   cataract surgery   TONSILLECTOMY  1941   ulcer surgery     Patient Active Problem List   Diagnosis Date Noted   Acute diverticulitis 02/10/2024   Hydronephrosis of left kidney 05/03/2023   Nephrolithiasis 05/03/2023   AKI (acute kidney injury) 05/03/2023   Pneumonia due to COVID-19 virus 10/16/2022   Acute respiratory failure with hypoxia (HCC) 10/15/2022   CKD stage 3b, GFR 30-44 ml/min (HCC) 08/25/2018   Hypertension 08/25/2018   BPH (benign prostatic hyperplasia) 02/24/2018   NECK PAIN 07/17/2009   CONTUSION OF MULTIPLE SITES NEC 07/17/2009   LACTOSE INTOLERANCE 06/14/2009   INTERNAL HEMORRHOIDS 06/14/2009   GERD 06/14/2009   DIVERTICULITIS, COLON 06/14/2009   IRRITABLE BOWEL SYNDROME 06/14/2009   PUD, HX OF 06/14/2009   History of colonic polyps 06/14/2009    NEPHROLITHIASIS, HX OF 06/14/2009   TRAUMATIC ARTHROPATHY SHOULDER REGION 12/06/2008   SPINAL STENOSIS, LUMBAR 12/06/2008   UNS ADVRS EFF UNS RX MEDICINAL&BIOLOGICAL SBSTNC 05/06/2007   NEOP, MALIGNANT, SKIN, FACE NEC 11/21/2006   Malignant neoplasm of skin of face 11/21/2006   Hypothyroidism 08/01/2006   Hyperlipidemia 08/01/2006   Diverticulosis of colon 08/01/2006    PCP: Wolm Scarlet, MD  REFERRING PROVIDER: Ozell Heron HERO, MD  REFERRING DIAG: W19.CHERENE (ICD-10-CM) - Fall, initial encounter R26.89 (ICD-10-CM) - Balance disorder M48.061 (ICD-10-CM) - Spinal stenosis, lumbar region, without neurogenic claudication  Rationale for Evaluation and Treatment: Rehabilitation  THERAPY DIAG:  Lumbar stenosis with neurogenic claudication  Repeated falls  Muscle weakness (generalized)  Abnormal posture  Other abnormalities of gait and mobility  ONSET DATE: chronic - several falls in past 3 mos  SUBJECTIVE:  SUBJECTIVE STATEMENT: Lapse in treatment x 1 month.  Pt has not had any falls.  Not consistent with HEP due to diverticulitis and Christmas holiday.     Pt and his daughter Arlean present for appt.  Daughter reports increased falls over the past 3 mos.  He uses a walker in the home, has accommodations in bathroom with shower chair, grab bars, several placed walkers throughout home, handheld shower head.  Has higher toilets.  Has lumbar stenosis and legs have been more numb with Lt leg weakness and giving way.   Gets steroid injections every 3-4 mos which helps a little.   Lives in single level townhome with his wife.  Daughter Arlean lives 10 min away.  Her brother is also local [place]. Has falled 2x in last 2 weeks when up at night to go to the bathroom.  Doesn't remember second fall.  His  wife took 30 min to get him up off floor.  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 Yes. 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  DIAGNOSTIC FINDINGS:  EXAM: 1 VIEW XRAY OF THE LEFT SHOULDER 12/17/2023    COMPARISON: X-ray left humerus 12/04/2008.   CLINICAL HISTORY: Left shoulder pain s/p fall.   FINDINGS:   BONES AND JOINTS: Mild glenohumeral joint osteoarthritis. Moderate acromioclavicular joint osteoarthritis. No acute fracture or dislocation.   SOFT TISSUES: No abnormal calcifications. Visualized lung is unremarkable.   IMPRESSION: 1. No acute fracture or dislocation.  PATIENT SURVEYS:  The Patient-Specific Functional Scale  Initial:  I am going to ask you to identify up to 3 important activities that you are unable to do or are having difficulty with as a result of this problem.  Today are there any activities that you are unable to do or having difficulty with because of this?  (Patient shown scale and patient rated each activity)  Follow up: When you first came in you had  difficulty performing these activities.  Today do you still have difficulty?  Patient-Specific activity scoring scheme (Point to one number):  0 1 2 3 4 5 6 7 8 9  10 Unable  Able to perform To perform                                                                                                    activity at the same Activity         Level as before                                                                                                                       Injury or problem  Activity           Walking at home with walker                                                                       Initial:         7              follow up:   2.              Getting in/out of car                                                                     Initial:       7                follow up:   SCORE: 7   COGNITION: Overall cognitive status: Within functional limits for tasks assessed     SENSATION: Numb from feet to knees bil from stenosis  MUSCLE LENGTH: Not formally assessed but tight hip flexors secondary to flexed posture  POSTURE: rounded shoulders, forward head, increased thoracic kyphosis, and flexed trunk   PALPATION:   LUMBAR ROM:  Not formally assessed due to poor stability, lacks trunk extension to upright posture secondary to stenosis, lacks trunk rotation with functional mobility  LOWER EXTREMITY ROM:    WFL  LOWER EXTREMITY MMT:   Hips 4/5 bil Knees: quads 4+/5 bil, hamstrings 4-/5 bil Ankles: 3+/5 all directions bil    FUNCTIONAL TESTS:  5 times sit to stand: 20.35 with bil arm rests, trunk remains flexed and  Pt does not leg go of arm rests Timed up and go (TUG): 38.13 with 2-wheel walker  02/16/24:  TUG: 25.57 seconds  5x sit to stand: 17.08 seconds  with bil arm rests, trunk remains flexed and Pt does not leg go of arm rests  GAIT: Distance  walked:  Assistive device utilized: Single point cane and Walker - 2 wheeled Level of assistance: Modified independence Comments: Pt needs min/mod assist x1 with cane use, mod ind with walker  TREATMENT DATE:  02/16/23: Therapeutic Exercises NuStep: Level 5 (UE 10 / LE 8) x 8 minutes-PT present to discuss progress with pt and his daughter Seated:   2# each ankle for: Long arc quads 5 sec holds   2 x 12 each  Hip adduction with purple ball: 5 hold x20  Row with red band 2x10  ER with 2# weight x15 each    Hamstring curl with red loop 2x10 bil each Therapeutic Activities/Neuro reeducation  At barre:  Sidestepping x 3 laps  Weight shifting on balance pad: side to side x 1 min  Alternating step taps 6 step x20 with Rt UE support on barre   01/14/24: Therapeutic Exercises NuStep: Level 5 (UE 10 / LE 8) x 8 minutes-PT present to discuss progress with pt and his daughter Seated:   2# each ankle for: Long arc quads 5 sec holds   2 x 12 each  Hip adduction with purple ball: 5 hold x20  Row with red band 2x10  ER with 2# weight x15 each    Hamstring curl with red loop 2x10 bil each Therapeutic Activities/Neuro reeducation  At barre:  Sidestepping x 3 laps  Stepping over hurdles with step to gait x2 laps (3 hurdles)- UE support on barre  Weight shifting on balance pad: side to side x 1 min  Alternating step taps 6 step x20 with Rt UE support on barre   01/12/24: Therapeutic Exercises NuStep: Level 5 (UE 10 / LE 8) x 7 minutes-PTA present to discuss progress with pt and his daughter Seated:   2# each ankle for: Long arc quads 5 sec holds, alt marching  2 x 12 each  Hip adduction with purple ball: 5 hold x20  Hip abduction with red loop x 25 Therapeutic Activities  In // bars with 2# on each ankle for following: Hip 3 way raises x 10 each with demo and VC's throughout for correct technique/erect posture Mini squat in bars with demo and VC's throughout, did not add to HEP due to  difficulty with correct technique and relied on UE support on bars heavily due to technique, not leg strength. 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 Person educated: Patient and Child(ren) Education method: Demonstration, Verbal cues, and Handouts Education comprehension: verbalized understanding and returned demonstration, will benefit from further review  HOME EXERCISE PROGRAM: Access Code: [email or web address] URL: [email or web address] Date: 12/29/2023 Prepared by: Burnard  Exercises - Seated March  - 1 x daily - 7 x weekly - 2 sets - 20 reps - Seated Long Arc Quad  - 1 x daily - 7 x weekly - 2 sets - 10 reps - Seated Heel Toe Raises  - 2 x daily - 7 x weekly - 2 sets - 10 reps - Seated Isometric Hip Adduction with Ball  - 2 x daily - 7 x weekly - 2 sets - 10 reps - Seated Isometric Hip Abduction with Resistance  - 2 x daily - 7 x weekly - 2 sets - 10 reps  Access Code: [email or web address] URL: [email or web address] Date: 01/12/2024 Prepared by: Berwyn Knights  Exercises - Standing March with Counter Support  - 2 x daily - 7 x weekly - 1-2 sets - 10 reps - Standing Hip Abduction with Counter Support  - 2 x daily - 7 x weekly - 1-2 sets - 10 reps - Standing Hip Extension with Counter Support  - 2 x daily - 7 x weekly - 1-2 sets - 10 reps - Heel Toe Raises with Counter Support  - 2 x daily - 7 x weekly - 1-2 sets - 20 reps - Tricep Push Up on  Wall  - 2 x daily - 7 x weekly - 2 sets - 10 reps  ASSESSMENT:  CLINICAL IMPRESSION: Lapse in treatment due to diverticulitis and Christmas holiday.  Pt with improved time on TUG and 5x sit to stand vs initial testing.  Pt continues to require frequent verbal cues for posture and uses max UE support for sit to stand transition.  He will work to get more consistent with his HEP and will be able to be more consistent with attendance.  He will benefit from continued physical therapy at this time to progress his bil LE strength as tolerated and to ensure safety and proper technique with his HEP.   OBJECTIVE IMPAIRMENTS: Abnormal gait, decreased activity tolerance, decreased balance, decreased coordination, decreased endurance, decreased mobility, difficulty walking, decreased ROM, decreased strength, hypomobility, increased muscle spasms, impaired flexibility, impaired sensation, improper body mechanics, postural dysfunction, and pain.   ACTIVITY LIMITATIONS: lifting, bending, standing, squatting, stairs, transfers, dressing, and locomotion level  PARTICIPATION LIMITATIONS: community activity  PERSONAL FACTORS: Age and Time since onset of injury/illness/exacerbation are also affecting patient's functional outcome.   REHAB POTENTIAL: Excellent  CLINICAL DECISION MAKING: Stable/uncomplicated  EVALUATION COMPLEXITY: Low   GOALS: Goals reviewed with patient? Yes  SHORT TERM GOALS: Target date: 01/23/24  Pt will be ind with initial HEP Baseline: Goal status: MET  2.  Pt will be compliant with use of walker full time (vs SPC + mod A x1 of family member for outings). Baseline: 01/14/24 Goal status: MET  3.  Pt will report no falls at night with use of a bedside commode (PT advised getting one for nighttime safety for multiple bathroom trips per night). Baseline: walking to bathroom and no falls (02/16/24) Goal status: in progress     LONG TERM GOALS: Target date: 02/20/24  Pt will be ind  with targeted HEP and understand the importance of compliance to maintain gains made in PT. Baseline:  Goal status: INITIAL  2.  Pt will report no falls of near  falls within episode of care Baseline: no falls, using walker at home Goal status: INITIAL  3.  Pt will perform 5x sit to stand in 16 sec or less with use of bil UE to demo reduced fall risk Baseline: 17.08 (02/16/24) Goal status: In progress   4.  Pt will achieve at least 4/5 strength in bil ankles and knees to improve gait stability Baseline:  Goal status: INITIAL  5.  Pt will perform TUG in </= 30 sec with walker to demo improved functional mobility and reduced fall risk Baseline: 25.57 seconds with walker (02/16/24) Goal status: In progress   6.  Pt will demo gait endurance by completing at least a using walker to improve tolerance of community outings. Baseline:  Goal status: INITIAL  7. PSFS score will improve to at least 8 for both categories (household mobility, getting in/out of car)  Goal status: INITIAL  PLAN:  PT FREQUENCY: 1-2x/week  PT DURATION: 8 weeks  PLANNED INTERVENTIONS: 97110-Therapeutic exercises, 97530- Therapeutic activity, 97112- Neuromuscular re-education, 97535- Self Care, [id number]- Manual therapy, [id number]- Gait training, [phone]- Aquatic Therapy, [phone]- Traction (mechanical), [phone] (1-2 muscles), [phone] (3+ muscles)- Dry Needling, Patient/Family education, Balance training, Stair training, Joint mobilization, Spinal mobilization, DME instructions, Cryotherapy, and Moist heat.  PLAN FOR NEXT SESSION:  NuStep for endurance, ankle strength, review and progress seated HEP, Pt is at mod/high risk for falls. 3 min walk test next session ERO next  Burnard Joy, PT 02/16/2024 2:55 PM     "

## 2024-02-18 ENCOUNTER — Ambulatory Visit

## 2024-02-18 DIAGNOSIS — M48062 Spinal stenosis, lumbar region with neurogenic claudication: Secondary | ICD-10-CM

## 2024-02-18 DIAGNOSIS — M6281 Muscle weakness (generalized): Secondary | ICD-10-CM

## 2024-02-18 DIAGNOSIS — R293 Abnormal posture: Secondary | ICD-10-CM

## 2024-02-18 DIAGNOSIS — R2689 Other abnormalities of gait and mobility: Secondary | ICD-10-CM

## 2024-02-18 DIAGNOSIS — R296 Repeated falls: Secondary | ICD-10-CM

## 2024-02-18 NOTE — Therapy (Signed)
 " OUTPATIENT PHYSICAL THERAPY TREATMENT   Patient Name: Timothy Garrett MRN: [id number] DOB:[birth year], 89 y.o., male Today's Date: 02/18/2024  END OF SESSION:  PT End of Session - 02/18/24 1512     Visit Number 6    Date for Recertification  04/14/24    Authorization Type BCBS Medicare - no auth req    Progress Note Due on Visit 10    PT Start Time 1431    PT Stop Time 1523    PT Time Calculation (min) 52 min    Activity Tolerance Patient tolerated treatment well    Behavior During Therapy WFL for tasks assessed/performed              Past Medical History:  Diagnosis Date   Arthritis    Diverticulitis    Diverticulitis    GERD (gastroesophageal reflux disease)    Heartburn   Hyperlipemia    Hypertension    IBS (irritable bowel syndrome)    Kidney stones    Past Surgical History:  Procedure Laterality Date   BACK SURGERY     CYSTOSCOPY/URETEROSCOPY/HOLMIUM LASER/STENT PLACEMENT Bilateral 05/03/2023   Procedure: CYSTOSCOPY/URETEROSCOPY/HOLMIUM LASER/STENT PLACEMENT;  Surgeon: Devere Lonni Righter, MD;  Location: [facility];  Service: Urology;  Laterality: Bilateral;   EYE SURGERY  2016   cataract surgery   TONSILLECTOMY  1941   ulcer surgery     Patient Active Problem List   Diagnosis Date Noted   Acute diverticulitis 02/10/2024   Hydronephrosis of left kidney 05/03/2023   Nephrolithiasis 05/03/2023   AKI (acute kidney injury) 05/03/2023   Pneumonia due to COVID-19 virus 10/16/2022   Acute respiratory failure with hypoxia (HCC) 10/15/2022   CKD stage 3b, GFR 30-44 ml/min (HCC) 08/25/2018   Hypertension 08/25/2018   BPH (benign prostatic hyperplasia) 02/24/2018   NECK PAIN 07/17/2009   CONTUSION OF MULTIPLE SITES NEC 07/17/2009   LACTOSE INTOLERANCE 06/14/2009   INTERNAL HEMORRHOIDS 06/14/2009   GERD 06/14/2009   DIVERTICULITIS, COLON 06/14/2009   IRRITABLE BOWEL SYNDROME 06/14/2009   PUD, HX OF 06/14/2009   History of colonic polyps 06/14/2009    NEPHROLITHIASIS, HX OF 06/14/2009   TRAUMATIC ARTHROPATHY SHOULDER REGION 12/06/2008   SPINAL STENOSIS, LUMBAR 12/06/2008   UNS ADVRS EFF UNS RX MEDICINAL&BIOLOGICAL SBSTNC 05/06/2007   NEOP, MALIGNANT, SKIN, FACE NEC 11/21/2006   Malignant neoplasm of skin of face 11/21/2006   Hypothyroidism 08/01/2006   Hyperlipidemia 08/01/2006   Diverticulosis of colon 08/01/2006    PCP: Wolm Scarlet, MD  REFERRING PROVIDER: Ozell Heron HERO, MD  REFERRING DIAG: W19.CHERENE (ICD-10-CM) - Fall, initial encounter R26.89 (ICD-10-CM) - Balance disorder M48.061 (ICD-10-CM) - Spinal stenosis, lumbar region, without neurogenic claudication  Rationale for Evaluation and Treatment: Rehabilitation  THERAPY DIAG:  Lumbar stenosis with neurogenic claudication - Plan: PT plan of care cert/re-cert  Repeated falls - Plan: PT plan of care cert/re-cert  Muscle weakness (generalized) - Plan: PT plan of care cert/re-cert  Abnormal posture - Plan: PT plan of care cert/re-cert  Other abnormalities of gait and mobility - Plan: PT plan of care cert/re-cert  ONSET DATE: chronic - several falls in past 3 mos  SUBJECTIVE:  SUBJECTIVE STATEMENT: No pain after last session.  Not consistent with HEP.     Pt and his daughter Arlean present for appt.  Daughter reports increased falls over the past 3 mos.  He uses a walker in the home, has accommodations in bathroom with shower chair, grab bars, several placed walkers throughout home, handheld shower head.  Has higher toilets.  Has lumbar stenosis and legs have been more numb with Lt leg weakness and giving way.   Gets steroid injections every 3-4 mos which helps a little.   Lives in single level townhome with his wife.  Daughter Arlean lives 10 min away.  Her brother is also local [place]. Has falled 2x in last 2 weeks when up at night to go to the bathroom.  Doesn't remember second fall.  His wife took 30 min to get him up off floor.  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 Yes. 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  DIAGNOSTIC FINDINGS:  EXAM: 1 VIEW XRAY OF THE LEFT SHOULDER 12/17/2023    COMPARISON: X-ray left humerus 12/04/2008.   CLINICAL HISTORY: Left shoulder pain s/p fall.   FINDINGS:   BONES AND JOINTS: Mild glenohumeral joint osteoarthritis. Moderate acromioclavicular joint osteoarthritis. No acute fracture or dislocation.   SOFT TISSUES: No abnormal calcifications. Visualized lung is unremarkable.   IMPRESSION: 1. No acute fracture or dislocation.  PATIENT SURVEYS:  The Patient-Specific Functional Scale  Initial:  I am going to ask you to identify up to 3 important activities that you are unable to do or are having difficulty with as a result of this problem.  Today are there any activities that you are unable to do or having difficulty with because  of this?  (Patient shown scale and patient rated each activity)  Follow up: When you first came in you had difficulty performing these activities.  Today do you still have difficulty?  Patient-Specific activity scoring scheme (Point to one number):  0 1 2 3 4 5 6 7 8 9  10 Unable  Able to perform To perform                                                                                                    activity at the same Activity         Level as before                                                                                                                       Injury or problem  Activity           Walking at home with walker                                                                       Initial:         7              follow up: 02/16/24 -8  2.              Getting in/out of car                                                                     Initial:       7                follow up: 02/16/24-8   SCORE: 7   COGNITION: Overall cognitive status: Within functional limits for tasks assessed     SENSATION: Numb from feet to knees bil from stenosis  MUSCLE LENGTH: Not formally assessed but tight hip flexors secondary to flexed posture  POSTURE: rounded shoulders, forward head, increased thoracic kyphosis, and flexed trunk   PALPATION:   LUMBAR ROM:  Not formally assessed due to poor stability, lacks trunk extension to upright posture secondary to stenosis, lacks trunk rotation with functional mobility  LOWER EXTREMITY ROM:    WFL  LOWER EXTREMITY MMT:   Hips 4/5 bil Knees: quads 4+/5 bil, hamstrings 4-/5 bil Ankles: 3+/5 all directions bil    FUNCTIONAL TESTS:  5 times sit to stand: 20.35 with bil arm rests, trunk remains  flexed and Pt does not leg go of arm rests Timed up and go (TUG): 38.13 with 2-wheel walker  02/16/24:  TUG: 25.57 seconds  5x  sit to stand: 17.08 seconds  with bil arm rests, trunk remains flexed and Pt does not leg go of arm rests  02/18/24: 3 min walk test: 306 feet with 1-2/10 RPE  GAIT: Distance walked:  Assistive device utilized: Single point cane and Walker - 2 wheeled Level of assistance: Modified independence Comments: Pt needs min/mod assist x1 with cane use, mod ind with walker  TREATMENT DATE:   02/18/23: Therapeutic Exercises NuStep: Level 5 (UE 10 / LE 8) x 8 minutes-PT present to discuss progress with pt  Seated:   2# each ankle for: Long arc quads 5 sec holds   2 x 12 each  Hip adduction with purple ball: 5 hold x20  Row with red band 2x10  ER with 2# weight x15 each    Hamstring curl with red loop 2x10 bil each Therapeutic Activities/Neuro reeducation 3 min walk with rolling walker: 306 feet   At barre:  Sidestepping x 3 laps  Shoulder flexion: 1# weights 2x10 bil each  Marching with 2# ankle weights: 2x10 bil with UE support.  02/16/23: Therapeutic Exercises NuStep: Level 5 (UE 10 / LE 8) x 8 minutes-PT present to discuss progress with pt and his daughter Seated:   2# each ankle for: Long arc quads 5 sec holds   2 x 12 each  Hip adduction with purple ball: 5 hold x20  Row with red band 2x10  ER with 2# weight x15 each    Hamstring curl with red loop 2x10 bil each Therapeutic Activities/Neuro reeducation  At barre:  Sidestepping x 3 laps  Weight shifting on balance pad: side to side x 1 min  Alternating step taps 6 step x20 with Rt UE support on barre   01/14/24: Therapeutic Exercises NuStep: Level 5 (UE 10 / LE 8) x 8 minutes-PT present to discuss progress with pt and his daughter Seated:   2# each ankle for: Long arc quads 5 sec holds   2 x 12 each  Hip adduction with purple ball: 5 hold x20  Row with red band 2x10  ER with 2# weight x15 each    Hamstring curl with red loop 2x10 bil each Therapeutic Activities/Neuro reeducation  At barre:  Sidestepping x 3 laps   Stepping over hurdles with step to gait x2 laps (3 hurdles)- UE support on barre  Weight shifting on balance pad: side to side x 1 min  Alternating step taps 6 step x20 with Rt UE support on barre   01/12/24: Therapeutic Exercises NuStep: Level 5 (UE 10 / LE 8) x 7 minutes-PTA present to discuss progress with pt and his daughter Seated:   2# each ankle for: Long arc quads 5 sec holds, alt marching  2 x 12 each  Hip adduction with purple ball: 5 hold x20  Hip abduction with red loop x 25 Therapeutic Activities  In // bars with 2# on each ankle for following: Hip 3 way raises x 10 each with demo and VC's throughout for correct technique/erect posture Mini squat in bars with demo and VC's throughout, did not add to HEP due to difficulty with correct technique and relied on UE support on bars heavily due to technique, not leg strength. Worried he would fall backwards when trying this at home so advised him and daughter to not do this at home  yet, they verbalized understanding Heel - toe raises 2 x 10 in // bars, demo and VC's for ankle ROM only and to limit weight shifting, he struggled with this but was able to show improved technique after multiple cues Wall Push Ups x 10 with therapist performing with him for demo, VC's to full ROM as able                                                                                                                    PATIENT EDUCATION:  Education details: [email or web address] Person educated: Patient and Child(ren) Education method: Demonstration, Verbal cues, and Handouts Education comprehension: verbalized understanding and returned demonstration, will benefit from further review  HOME EXERCISE PROGRAM: Access Code: [email or web address] URL: [email or web address] Date: 12/29/2023 Prepared by: Burnard  Exercises - Seated March  - 1 x daily - 7 x weekly - 2 sets - 20 reps - Seated Long Arc Quad  - 1 x daily - 7 x weekly - 2 sets - 10 reps - Seated Heel Toe  Raises  - 2 x daily - 7 x weekly - 2 sets - 10 reps - Seated Isometric Hip Adduction with Ball  - 2 x daily - 7 x weekly - 2 sets - 10 reps - Seated Isometric Hip Abduction with Resistance  - 2 x daily - 7 x weekly - 2 sets - 10 reps  Access Code: [email or web address] URL: [email or web address] Date: 01/12/2024 Prepared by: Berwyn Knights  Exercises - Standing March with Counter Support  - 2 x daily - 7 x weekly - 1-2 sets - 10 reps - Standing Hip Abduction with Counter Support  - 2 x daily - 7 x weekly - 1-2 sets - 10 reps - Standing Hip Extension with Counter Support  - 2 x daily - 7 x weekly - 1-2 sets - 10 reps - Heel Toe Raises with Counter Support  - 2 x daily - 7 x weekly - 1-2 sets - 20 reps - Tricep Push Up on Wall  - 2 x daily - 7 x weekly - 2 sets - 10 reps  ASSESSMENT:  CLINICAL IMPRESSION: Pt has not resumed consistency with his HEP since the Christmas holiday and bout of diverticulitis recently.  Pt with improved time on TUG and 5x sit to stand vs initial testing this week.  He completed 3 min walk test today with ease and covered 306 feet.  Pt continues to require frequent verbal cues for posture and uses max UE support for sit to stand transition.  PSFS values for gait with walker and getting in/out of the car have both improved since the start of care.   He will benefit from continued physical therapy at this time to progress his bil LE strength as tolerated and to ensure safety and proper technique with his HEP.   OBJECTIVE IMPAIRMENTS: Abnormal gait, decreased activity tolerance, decreased balance, decreased coordination, decreased endurance, decreased mobility, difficulty walking, decreased ROM, decreased strength, hypomobility, increased muscle spasms, impaired flexibility, impaired sensation, improper  body mechanics, postural dysfunction, and pain.   ACTIVITY LIMITATIONS: lifting, bending, standing, squatting, stairs, transfers, dressing, and locomotion  level  PARTICIPATION LIMITATIONS: community activity  PERSONAL FACTORS: Age and Time since onset of injury/illness/exacerbation are also affecting patient's functional outcome.   REHAB POTENTIAL: Excellent  CLINICAL DECISION MAKING: Stable/uncomplicated  EVALUATION COMPLEXITY: Low   GOALS: Goals reviewed with patient? Yes  SHORT TERM GOALS: Target date: 01/23/24  Pt will be ind with initial HEP Baseline: Goal status: MET  2.  Pt will be compliant with use of walker full time (vs SPC + mod A x1 of family member for outings). Baseline: 01/14/24 Goal status: MET  3.  Pt will report no falls at night with use of a bedside commode (PT advised getting one for nighttime safety for multiple bathroom trips per night). Baseline: walking to bathroom and no falls (02/16/24) Goal status: MET    LONG TERM GOALS: Target date: 04/14/2024    Pt will be ind with targeted HEP and understand the importance of compliance to maintain gains made in PT. Baseline: min to moderate compliance due to being sick and holidays (02/17/24) Goal status: In progress   2.  Demonstrate erect posture with use of walker with 50% fewer verbal cues during PT session Baseline: flexed posture with frequent verbal cues with standing (02/18/24) Goal status: NEW  3.  Pt will perform 5x sit to stand in 15 sec or less with use of bil UE to demo reduced fall risk Baseline: 17.08 (02/16/24) Goal status: In progress   4.  Improve LE strength to perform sit to stand with min to moderate UE support and stabilize independently Baseline: max UE support and use of hands on chair handles when standing (02/18/24) Goal status: NEW  5.  Pt will perform TUG in </= 21 sec with walker to demo improved functional mobility and reduced fall risk Baseline: 25.57 seconds with walker (02/16/24) Goal status: In progress   6.  Ambulate 400 feet in 3 minutes to improve community ambulation  Baseline: 306 feet with 1-2/10 RPE (02/18/24) Goal  status: NEW  7. PSFS score will improve to at least 8 for both categories (household mobility, getting in/out of car) Baseline: 8/10  Goal status: MET  PLAN:  PT FREQUENCY: 1-2x/week  PT DURATION: 8 weeks  PLANNED INTERVENTIONS: 97110-Therapeutic exercises, 97530- Therapeutic activity, 97112- Neuromuscular re-education, 97535- Self Care, [id number]- Manual therapy, [id number]- Gait training, [phone]- Aquatic Therapy, [phone]- Traction (mechanical), [phone] (1-2 muscles), [id number] (3+ muscles)- Dry Needling, Patient/Family education, Balance training, Stair training, Joint mobilization, Spinal mobilization, DME instructions, Cryotherapy, and Moist heat.  PLAN FOR NEXT SESSION:  NuStep for endurance, ankle strength, review and progress seated HEP, work on standing posture, work on sit to stand and ability to stabilize independently without Ues in standing   [employer], PT 02/18/2024 3:26 PM     "

## 2024-02-23 NOTE — Telephone Encounter (Signed)
 Left message for the patient to return my call.

## 2024-02-25 ENCOUNTER — Other Ambulatory Visit: Payer: Self-pay | Admitting: Family Medicine

## 2024-02-25 ENCOUNTER — Ambulatory Visit

## 2024-02-25 DIAGNOSIS — E039 Hypothyroidism, unspecified: Secondary | ICD-10-CM

## 2024-02-25 DIAGNOSIS — M48062 Spinal stenosis, lumbar region with neurogenic claudication: Secondary | ICD-10-CM | POA: Diagnosis not present

## 2024-02-25 DIAGNOSIS — R296 Repeated falls: Secondary | ICD-10-CM

## 2024-02-25 DIAGNOSIS — R293 Abnormal posture: Secondary | ICD-10-CM

## 2024-02-25 DIAGNOSIS — M6281 Muscle weakness (generalized): Secondary | ICD-10-CM

## 2024-02-25 DIAGNOSIS — R2689 Other abnormalities of gait and mobility: Secondary | ICD-10-CM

## 2024-02-25 NOTE — Therapy (Signed)
 " OUTPATIENT PHYSICAL THERAPY TREATMENT   Patient Name: Timothy Garrett MRN: [id number] DOB:1931-06-10, 89 y.o., male Today's Date: 02/25/2024  END OF SESSION:  PT End of Session - 02/25/24 1606     Visit Number 7    Date for Recertification  04/14/24    Authorization Type BCBS Medicare - no auth req    Progress Note Due on Visit 10    PT Start Time 1602    PT Stop Time 1648    PT Time Calculation (min) 46 min    Activity Tolerance Patient tolerated treatment well    Behavior During Therapy WFL for tasks assessed/performed              Past Medical History:  Diagnosis Date   Arthritis    Diverticulitis    Diverticulitis    GERD (gastroesophageal reflux disease)    Heartburn   Hyperlipemia    Hypertension    IBS (irritable bowel syndrome)    Kidney stones    Past Surgical History:  Procedure Laterality Date   BACK SURGERY     CYSTOSCOPY/URETEROSCOPY/HOLMIUM LASER/STENT PLACEMENT Bilateral 05/03/2023   Procedure: CYSTOSCOPY/URETEROSCOPY/HOLMIUM LASER/STENT PLACEMENT;  Surgeon: Devere Lonni Righter, MD;  Location: [facility];  Service: Urology;  Laterality: Bilateral;   EYE SURGERY  2016   cataract surgery   TONSILLECTOMY  1941   ulcer surgery     Patient Active Problem List   Diagnosis Date Noted   Acute diverticulitis 02/10/2024   Hydronephrosis of left kidney 05/03/2023   Nephrolithiasis 05/03/2023   AKI (acute kidney injury) 05/03/2023   Pneumonia due to COVID-19 virus 10/16/2022   Acute respiratory failure with hypoxia (HCC) 10/15/2022   CKD stage 3b, GFR 30-44 ml/min (HCC) 08/25/2018   Hypertension 08/25/2018   BPH (benign prostatic hyperplasia) 02/24/2018   NECK PAIN 07/17/2009   CONTUSION OF MULTIPLE SITES NEC 07/17/2009   LACTOSE INTOLERANCE 06/14/2009   INTERNAL HEMORRHOIDS 06/14/2009   GERD 06/14/2009   DIVERTICULITIS, COLON 06/14/2009   IRRITABLE BOWEL SYNDROME 06/14/2009   PUD, HX OF 06/14/2009   History of colonic polyps 06/14/2009    NEPHROLITHIASIS, HX OF 06/14/2009   TRAUMATIC ARTHROPATHY SHOULDER REGION 12/06/2008   SPINAL STENOSIS, LUMBAR 12/06/2008   UNS ADVRS EFF UNS RX MEDICINAL&BIOLOGICAL SBSTNC 05/06/2007   NEOP, MALIGNANT, SKIN, Garrett NEC 11/21/2006   Malignant neoplasm of skin of Garrett 11/21/2006   Hypothyroidism 08/01/2006   Hyperlipidemia 08/01/2006   Diverticulosis of colon 08/01/2006    PCP: Wolm Scarlet, MD  REFERRING PROVIDER: Ozell Heron HERO, MD  REFERRING DIAG: W19.CHERENE (ICD-10-CM) - Fall, initial encounter R26.89 (ICD-10-CM) - Balance disorder M48.061 (ICD-10-CM) - Spinal stenosis, lumbar region, without neurogenic claudication  Rationale for Evaluation and Treatment: Rehabilitation  THERAPY DIAG:  Lumbar stenosis with neurogenic claudication  Repeated falls  Muscle weakness (generalized)  Abnormal posture  Other abnormalities of gait and mobility  ONSET DATE: chronic - several falls in past 3 mos  SUBJECTIVE:  SUBJECTIVE STATEMENT: Pt reports has started doing his HEP and exs are going well.     Pt and his daughter Arlean present for appt.  Daughter reports increased falls over the past 3 mos.  He uses a walker in the home, has accommodations in bathroom with shower chair, grab bars, several placed walkers throughout home, handheld shower head.  Has higher toilets.  Has lumbar stenosis and legs have been more numb with Lt leg weakness and giving way.   Gets steroid injections every 3-4 mos which helps a little.   Lives in single level townhome with his wife.  Daughter Arlean lives 10 min away.  Her brother is also local [place]. Has falled 2x in last 2 weeks when up at night to go to the bathroom.  Doesn't remember second fall.  His wife took 30 min to get him up off floor.  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 Yes. 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  DIAGNOSTIC FINDINGS:  EXAM: 1 VIEW XRAY OF THE LEFT SHOULDER 12/17/2023    COMPARISON: X-ray left humerus 12/04/2008.   CLINICAL HISTORY: Left shoulder pain s/p fall.   FINDINGS:   BONES AND JOINTS: Mild glenohumeral joint osteoarthritis. Moderate acromioclavicular joint osteoarthritis. No acute fracture or dislocation.   SOFT TISSUES: No abnormal calcifications. Visualized lung is unremarkable.   IMPRESSION: 1. No acute fracture or dislocation.  PATIENT SURVEYS:  The Patient-Specific Functional Scale  Initial:  I am going to ask you to identify up to 3 important activities that you are unable to do or are having difficulty with as a result of this problem.  Today are there any activities that you are unable to do or having difficulty with because of this?  (Patient shown scale and patient rated each activity)  Follow up: When you first came in you had difficulty performing these activities.  Today do you still have  difficulty?  Patient-Specific activity scoring scheme (Point to one number):  0 1 2 3 4 5 6 7 8 9  10 Unable  Able to perform To perform                                                                                                    activity at the same Activity         Level as before                                                                                                                       Injury or problem  Activity           Walking at home with walker                                                                       Initial:         7              follow up: 02/16/24 -8  2.              Getting in/out of car                                                                     Initial:       7                follow up: 02/16/24-8   SCORE: 7   COGNITION: Overall cognitive status: Within functional limits for tasks assessed     SENSATION: Numb from feet to knees bil from stenosis  MUSCLE LENGTH: Not formally assessed but tight hip flexors secondary to flexed posture  POSTURE: rounded shoulders, forward head, increased thoracic kyphosis, and flexed trunk   PALPATION:   LUMBAR ROM:  Not formally assessed due to poor stability, lacks trunk extension to upright posture secondary to stenosis, lacks trunk rotation with functional mobility  LOWER EXTREMITY ROM:    WFL  LOWER EXTREMITY MMT:   Hips 4/5 bil Knees: quads 4+/5 bil, hamstrings 4-/5 bil Ankles: 3+/5 all directions bil    FUNCTIONAL TESTS:  5 times sit to stand: 20.35 with bil arm rests, trunk remains  flexed and Pt does not leg go of arm rests Timed up and go (TUG): 38.13 with 2-wheel walker  02/16/24:  TUG: 25.57 seconds  5x sit to stand: 17.08 seconds  with bil arm rests, trunk remains flexed and Pt does not leg go of arm rests  02/18/24: 3 min walk test: 306 feet with 1-2/10  RPE  GAIT: Distance walked:  Assistive device utilized: Single point cane and Walker - 2 wheeled Level of assistance: Modified independence Comments: Pt needs min/mod assist x1 with cane use, mod ind with walker  TREATMENT DATE:  02/25/24: Therapeutic Exercises NuStep: Level 5 (UE 10 / LE 8) x 8 minutes-PTA present to monitor pt Seated:   2# each ankle for: Long arc quads 5 sec holds   2 x 12 each  Hip adduction with purple ball: 5 hold x 20      ER with red theraband 2 x 10   Row with red band 2 x 10 with demo and VC's during for correct scapular motion  Hamstring curl with green loop  x10 bil each Therapeutic Activities/Neuro Re education 4 step ups in // bars 2 x 5 each with PTA demo throughout for correct technique Bil hip 3 way raises on blue oval with 2# into flex, ext and abd (without blue oval due to ankle instability preventing even weight bearing on foot) x 10 each with demo throughout and VC's for correct technique and less UE support as able with erect posture Heel-toe walking front x 2 laps  02/18/23: Therapeutic Exercises NuStep: Level 5 (UE 10 / LE 8) x 8 minutes-PT present to discuss progress with pt  Seated:   2# each ankle for: Long arc quads 5 sec holds   2 x 12 each  Hip adduction with purple ball: 5 hold x20  Row with red band 2x10  ER with 2# weight x15 each    Hamstring curl with red loop 2x10 bil each Therapeutic Activities/Neuro reeducation 3 min walk with rolling walker: 306 feet   At barre:  Sidestepping x 3 laps  Shoulder flexion: 1# weights 2x10 bil each  Marching with 2# ankle weights: 2x10 bil with UE support.  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 Person educated: Patient and Child(ren) Education method: Demonstration, Verbal cues, and Handouts Education comprehension: verbalized understanding and returned demonstration, will benefit from further review  HOME EXERCISE PROGRAM: Access Code: [email or web address] URL: [email or web address] Date: 12/29/2023 Prepared by: Burnard  Exercises - Seated March  - 1 x daily - 7 x weekly - 2 sets - 20 reps - Seated Long Arc Quad  - 1 x daily - 7 x weekly - 2 sets - 10 reps - Seated Heel Toe Raises  - 2 x daily - 7 x weekly - 2 sets - 10 reps - Seated Isometric Hip Adduction with Ball  - 2 x daily - 7 x weekly - 2 sets - 10 reps - Seated Isometric Hip Abduction with Resistance  - 2 x daily - 7 x weekly - 2 sets - 10 reps  Access Code: [email or web address] URL: [email or web address] Date: 01/12/2024 Prepared by: Berwyn Knights  Exercises - Standing March with Counter Support  - 2 x daily - 7 x weekly - 1-2 sets - 10 reps - Standing Hip Abduction with Counter Support  - 2 x daily - 7 x weekly - 1-2 sets - 10 reps - Standing Hip Extension with Counter Support  - 2 x daily - 7 x weekly - 1-2 sets - 10 reps - Heel Toe Raises with Counter Support  - 2 x daily - 7 x weekly - 1-2 sets - 20 reps - Tricep Push Up on Wall  - 2 x daily - 7 x weekly - 2 sets - 10 reps  ASSESSMENT:  CLINICAL IMPRESSION: Continued with bil LE strength and static and dynamic balance activities. He has started doing some of his LE exs at home and reports they are going well. He required less VC's towards end of session for erect posture and  lessening his grip on bars. Will benefit from continued physical therapy to promote independence with his HEP and improve his bil LE strength and stability.   OBJECTIVE IMPAIRMENTS: Abnormal gait, decreased activity tolerance, decreased balance, decreased coordination, decreased endurance, decreased mobility, difficulty walking, decreased ROM, decreased strength, hypomobility, increased muscle spasms, impaired flexibility, impaired sensation, improper body mechanics, postural dysfunction, and pain.   ACTIVITY LIMITATIONS: lifting, bending, standing, squatting, stairs, transfers, dressing, and locomotion level  PARTICIPATION LIMITATIONS: community activity  PERSONAL FACTORS: Age and Time since onset of injury/illness/exacerbation are also affecting patient's functional outcome.   REHAB POTENTIAL: Excellent  CLINICAL DECISION MAKING: Stable/uncomplicated  EVALUATION COMPLEXITY: Low   GOALS: Goals reviewed with patient? Yes  SHORT TERM GOALS: Target date: 01/23/24  Pt will be ind with initial HEP Baseline: Goal status: MET  2.  Pt will be compliant with use of walker full time (vs SPC + mod A x1 of family member for outings). Baseline: 01/14/24 Goal status: MET  3.  Pt will report no falls at night with use of a bedside commode (PT advised getting one for nighttime safety for multiple bathroom trips per night). Baseline: walking to bathroom and no falls (02/16/24) Goal status: MET    LONG TERM GOALS: Target date: 04/14/2024    Pt will be ind with targeted HEP and understand the importance of compliance to maintain gains made in PT. Baseline: min to moderate compliance due to being sick and holidays (02/17/24) Goal status: In progress   2.  Demonstrate erect posture with use of walker with 50% fewer verbal cues during PT session Baseline: flexed posture with frequent verbal cues with standing (02/18/24)  Goal status: NEW  3.  Pt will perform 5x sit to stand in 15 sec or less with use  of bil UE to demo reduced fall risk Baseline: 17.08 (02/16/24) Goal status: In progress   4.  Improve LE strength to perform sit to stand with min to moderate UE support and stabilize independently Baseline: max UE support and use of hands on chair handles when standing (02/18/24) Goal status: NEW  5.  Pt will perform TUG in </= 21 sec with walker to demo improved functional mobility and reduced fall risk Baseline: 25.57 seconds with walker (02/16/24) Goal status: In progress   6.  Ambulate 400 feet in 3 minutes to improve community ambulation  Baseline: 306 feet with 1-2/10 RPE (02/18/24) Goal status: NEW  7. PSFS score will improve to at least 8 for both categories (household mobility, getting in/out of car) Baseline: 8/10  Goal status: MET  PLAN:  PT FREQUENCY: 1-2x/week  PT DURATION: 8 weeks  PLANNED INTERVENTIONS: 97110-Therapeutic exercises, 97530- Therapeutic activity, 97112- Neuromuscular re-education, 97535- Self Care, [id number]- Manual therapy, [id number]- Gait training, [phone]- Aquatic Therapy, [phone]- Traction (mechanical), [phone] (1-2 muscles), [phone] (3+ muscles)- Dry Needling, Patient/Family education, Balance training, Stair training, Joint mobilization, Spinal mobilization, DME instructions, Cryotherapy, and Moist heat.  PLAN FOR NEXT SESSION:  NuStep for endurance, ankle strength, add heel raises for ankle strength, review and progress seated HEP, work on standing posture, work on sit to stand and ability to stabilize independently without Ues in standing  Berwyn Knights, PTA 02/25/2024 5:00 PM     "

## 2024-03-01 ENCOUNTER — Encounter: Payer: Self-pay | Admitting: Family Medicine

## 2024-03-01 ENCOUNTER — Ambulatory Visit

## 2024-03-01 ENCOUNTER — Ambulatory Visit: Admitting: Family Medicine

## 2024-03-01 ENCOUNTER — Ambulatory Visit: Payer: Self-pay

## 2024-03-01 VITALS — BP 118/64 | HR 76 | Temp 98.3°F | Wt 147.0 lb

## 2024-03-01 DIAGNOSIS — M25472 Effusion, left ankle: Secondary | ICD-10-CM | POA: Diagnosis not present

## 2024-03-01 DIAGNOSIS — N1832 Chronic kidney disease, stage 3b: Secondary | ICD-10-CM

## 2024-03-01 DIAGNOSIS — M25471 Effusion, right ankle: Secondary | ICD-10-CM

## 2024-03-01 DIAGNOSIS — E039 Hypothyroidism, unspecified: Secondary | ICD-10-CM

## 2024-03-01 NOTE — Telephone Encounter (Signed)
 FYI Only or Action Required?: FYI only for provider: appt today.  Patient was last seen in primary care on 02/10/2024 by Jerrell Cleatus Ned, MD.  Called Nurse Triage reporting Foot Swelling.  Symptoms began a week ago.  Interventions attempted: Rest, hydration, or home remedies.elevation, increased water  Symptoms are: Legs are less swolllen after elevation and increasing fluids.  Triage Disposition: See PCP When Office is Open (Within 3 Days)  Patient/caregiver understands and will follow disposition?: Yes                    Reason for Triage: bilateral swelling in feet that began last week. Pt is drinking water, he reduced sodium in his diet, and has been keeping his feet elevated but the swelling is not going down.    Reason for Disposition  [1] MILD swelling of both ankles (i.e., pedal edema) AND [2] new-onset or getting worse  Answer Assessment - Initial Assessment Questions 1. ONSET: When did the swelling start? (e.g., minutes, hours, days)     Started last week -  2. LOCATION: What part of the leg is swollen?  Are both legs swollen or just one leg?     both 3. SEVERITY: How bad is the swelling? (e.g., localized; mild, moderate, severe)     Feet and legs more so in the feet - legs have gone down a bit 4. REDNESS: Is there redness or signs of infection?     Might be 5. PAIN: Is the swelling painful to touch? If Yes, ask: How painful is it?   (Scale 1-10; mild, moderate or severe)     Does not know 6. FEVER: Do you have a fever? If Yes, ask: What is it, how was it measured, and when did it start?      no 7. CAUSE: What do you think is causing the leg swelling?     Unsure - kidney? 8. MEDICAL HISTORY: Do you have a history of blood clots (e.g., DVT), cancer, heart failure, kidney disease, or liver failure?     no 9. RECURRENT SYMPTOM: Have you had leg swelling before? If Yes, ask: When was the last time? What happened that  time?     Yes - not sure when not recently 10. OTHER SYMPTOMS: Do you have any other symptoms? (e.g., chest pain, difficulty breathing)       no  Protocols used: Leg Swelling and Edema-A-AH

## 2024-03-01 NOTE — Progress Notes (Signed)
" ° °  Subjective:    Patient ID: Timothy Garrett, male    DOB: [birth year], 89 y.o.   MRN: [id number]  HPI Here with his daughter for one week of mild swelling in both feet and ankles. He has never had this before. No recent change in medications, although he was treated for diverticulitis a few weeks ago with Cipro  and Metronidazole . He has recovered well from this. He has mild CKD, but this has been stable. On 02-10-24 his creatinine was 1.25, and his GFR was 50.83. No hx of heart failure. He is treated for hypothyroidism. He denies any chest pain or SOB.    Review of Systems  Constitutional: Negative.   Respiratory: Negative.    Cardiovascular:  Positive for leg swelling. Negative for chest pain and palpitations.  Gastrointestinal: Negative.   Genitourinary: Negative.   Neurological: Negative.        Objective:   Physical Exam Constitutional:      Appearance: Normal appearance. He is not ill-appearing.     Comments: Walks with a walker   Cardiovascular:     Rate and Rhythm: Normal rate and regular rhythm.     Pulses: Normal pulses.     Heart sounds: Normal heart sounds.  Pulmonary:     Effort: Pulmonary effort is normal.     Breath sounds: Normal breath sounds.  Musculoskeletal:     Comments: 1+ edema in both feet and ankles   Neurological:     Mental Status: He is alert and oriented to person, place, and time.           Assessment & Plan:  Recent onset of mild ankle swelling. His daughter thinks part of the cause for this is eating more salty food over the holidays than he is used to. We will get labs today including a thyroid  panel, GFR, and BNP. I advised him to elevate his legs when he can. He will follow up with Dr. Micheal on this in one month. I personally spent a total of 34  minutes in the care of the patient today including getting/reviewing separately obtained history, performing a medically appropriate exam/evaluation, counseling and educating, and placing  orders.  Garnette Olmsted, MD  Garnette Olmsted, MD   "

## 2024-03-02 LAB — CBC WITH DIFFERENTIAL/PLATELET
Basophils Absolute: 0.1 K/uL (ref 0.0–0.1)
Basophils Relative: 1.3 % (ref 0.0–3.0)
Eosinophils Absolute: 0.3 K/uL (ref 0.0–0.7)
Eosinophils Relative: 3.2 % (ref 0.0–5.0)
HCT: 39.7 % (ref 39.0–52.0)
Hemoglobin: 13.5 g/dL (ref 13.0–17.0)
Lymphocytes Relative: 13.8 % (ref 12.0–46.0)
Lymphs Abs: 1.1 K/uL (ref 0.7–4.0)
MCHC: 34 g/dL (ref 30.0–36.0)
MCV: 100.5 fl — ABNORMAL HIGH (ref 78.0–100.0)
Monocytes Absolute: 0.8 K/uL (ref 0.1–1.0)
Monocytes Relative: 10.8 % (ref 3.0–12.0)
Neutro Abs: 5.5 K/uL (ref 1.4–7.7)
Neutrophils Relative %: 70.9 % (ref 43.0–77.0)
Platelets: 291 K/uL (ref 150.0–400.0)
RBC: 3.94 Mil/uL — ABNORMAL LOW (ref 4.22–5.81)
RDW: 14.1 % (ref 11.5–15.5)
WBC: 7.8 K/uL (ref 4.0–10.5)

## 2024-03-02 LAB — BASIC METABOLIC PANEL WITH GFR
BUN: 19 mg/dL (ref 6–23)
CO2: 28 meq/L (ref 19–32)
Calcium: 9.5 mg/dL (ref 8.4–10.5)
Chloride: 104 meq/L (ref 96–112)
Creatinine, Ser: 1.34 mg/dL (ref 0.40–1.50)
GFR: 46.74 mL/min — ABNORMAL LOW
Glucose, Bld: 102 mg/dL — ABNORMAL HIGH (ref 70–99)
Potassium: 4.5 meq/L (ref 3.5–5.1)
Sodium: 140 meq/L (ref 135–145)

## 2024-03-02 LAB — T3, FREE: T3, Free: 2.7 pg/mL (ref 2.3–4.2)

## 2024-03-02 LAB — T4, FREE: Free T4: 0.89 ng/dL (ref 0.60–1.60)

## 2024-03-02 LAB — TSH: TSH: 1.42 u[IU]/mL (ref 0.35–5.50)

## 2024-03-02 LAB — BRAIN NATRIURETIC PEPTIDE: Pro B Natriuretic peptide (BNP): 35 pg/mL (ref 1.0–100.0)

## 2024-03-03 ENCOUNTER — Telehealth: Payer: Self-pay | Admitting: *Deleted

## 2024-03-03 ENCOUNTER — Ambulatory Visit

## 2024-03-03 ENCOUNTER — Ambulatory Visit: Payer: Self-pay | Admitting: Family Medicine

## 2024-03-03 ENCOUNTER — Telehealth: Payer: Self-pay

## 2024-03-03 DIAGNOSIS — R296 Repeated falls: Secondary | ICD-10-CM

## 2024-03-03 DIAGNOSIS — R293 Abnormal posture: Secondary | ICD-10-CM

## 2024-03-03 DIAGNOSIS — R2689 Other abnormalities of gait and mobility: Secondary | ICD-10-CM

## 2024-03-03 DIAGNOSIS — M48062 Spinal stenosis, lumbar region with neurogenic claudication: Secondary | ICD-10-CM

## 2024-03-03 DIAGNOSIS — M6281 Muscle weakness (generalized): Secondary | ICD-10-CM

## 2024-03-03 NOTE — Telephone Encounter (Signed)
 Sent to wrong location  Error reported   Copied from CRM #[id number]. Topic: Clinical - Lab/Test Results >> Mar 03, 2024 12:00 PM Berwyn MATSU wrote: Reason for CRM: Patients daughter called in to request an update on patients lab work. Patients daughter is requesting a call back.  May you please advise.

## 2024-03-03 NOTE — Therapy (Addendum)
 " OUTPATIENT PHYSICAL THERAPY TREATMENT   Patient Name: Timothy Garrett MRN: [id number] DOB:1931-11-08, 89 y.o., male Today's Date: 03/03/2024  END OF SESSION:  PT End of Session - 03/03/24 1455     Visit Number 8    Date for Recertification  04/14/24    Authorization Type BC[employer] Medicare - no auth req    Progress Note Due on Visit 10    PT Start Time 1300    PT Stop Time 1344    PT Time Calculation (min) 44 min    Activity Tolerance Patient tolerated treatment well    Behavior During Therapy WFL for tasks assessed/performed               Past Medical History:  Diagnosis Date   Arthritis    Diverticulitis    Diverticulitis    GERD (gastroesophageal reflux disease)    Heartburn   Hyperlipemia    Hypertension    IBS (irritable bowel syndrome)    Kidney stones    Past Surgical History:  Procedure Laterality Date   BACK SURGERY     CYSTOSCOPY/URETEROSCOPY/HOLMIUM LASER/STENT PLACEMENT Bilateral 05/03/2023   Procedure: CYSTOSCOPY/URETEROSCOPY/HOLMIUM LASER/STENT PLACEMENT;  Surgeon: Devere Lonni Righter, MD;  Location: [facility];  Service: Urology;  Laterality: Bilateral;   EYE SURGERY  2016   cataract surgery   TONSILLECTOMY  1941   ulcer surgery     Patient Active Problem List   Diagnosis Date Noted   Acute diverticulitis 02/10/2024   Hydronephrosis of left kidney 05/03/2023   Nephrolithiasis 05/03/2023   AKI (acute kidney injury) 05/03/2023   Pneumonia due to COVID-19 virus 10/16/2022   Acute respiratory failure with hypoxia (HCC) 10/15/2022   CKD stage 3b, GFR 30-44 ml/min (HCC) 08/25/2018   Hypertension 08/25/2018   BPH (benign prostatic hyperplasia) 02/24/2018   NECK PAIN 07/17/2009   CONTUSION OF MULTIPLE SITES NEC 07/17/2009   LACTOSE INTOLERANCE 06/14/2009   INTERNAL HEMORRHOIDS 06/14/2009   GERD 06/14/2009   DIVERTICULITIS, COLON 06/14/2009   IRRITABLE BOWEL SYNDROME 06/14/2009   PUD, HX OF 06/14/2009   History of colonic polyps 06/14/2009    NEPHROLITHIASIS, HX OF 06/14/2009   TRAUMATIC ARTHROPATHY SHOULDER REGION 12/06/2008   SPINAL STENOSIS, LUMBAR 12/06/2008   UNS ADVRS EFF UNS RX MEDICINAL&BIOLOGICAL SBSTNC 05/06/2007   NEOP, MALIGNANT, SKIN, FACE NEC 11/21/2006   Malignant neoplasm of skin of face 11/21/2006   Hypothyroidism 08/01/2006   Hyperlipidemia 08/01/2006   Diverticulosis of colon 08/01/2006    PCP: Wolm Scarlet, MD  REFERRING PROVIDER: Ozell Heron HERO, MD  REFERRING DIAG: W19.CHERENE (ICD-10-CM) - Fall, initial encounter R26.89 (ICD-10-CM) - Balance disorder M48.061 (ICD-10-CM) - Spinal stenosis, lumbar region, without neurogenic claudication  Rationale for Evaluation and Treatment: Rehabilitation  THERAPY DIAG:  Lumbar stenosis with neurogenic claudication  Repeated falls  Muscle weakness (generalized)  Abnormal posture  Other abnormalities of gait and mobility  ONSET DATE: chronic - several falls in past 3 mos  SUBJECTIVE:  SUBJECTIVE STATEMENT: Patient reports he is doing well today, and denies any pain today. Reported that he saw the doctor earlier this week due to some swelling into the leg and into the feet, causing him to miss his previous session. The swelling is being monitored by the doctor.   From eval:  He uses a walker in the home, has accommodations in bathroom with shower chair, grab bars, several placed walkers throughout home, handheld shower head.  Has higher toilets.  Has lumbar stenosis and legs have been more numb with Lt leg weakness and giving way.   Gets steroid injections every 3-4 mos which helps a little.   Lives in single level townhome with his wife.  Daughter Arlean lives 10 min away.  Her brother is also local [place]. Has falled 2x in last 2 weeks when up at night to go to the  bathroom.  Doesn't remember second fall.  His wife took 30 min to get him up off floor.  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 Yes. 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  DIAGNOSTIC FINDINGS:  EXAM: 1 VIEW XRAY OF THE LEFT SHOULDER 12/17/2023    COMPARISON: X-ray left humerus 12/04/2008.   CLINICAL HISTORY: Left shoulder pain s/p fall.   FINDINGS:   BONES AND JOINTS: Mild glenohumeral joint osteoarthritis. Moderate acromioclavicular joint osteoarthritis. No acute fracture or dislocation.   SOFT TISSUES: No abnormal calcifications. Visualized lung is unremarkable.   IMPRESSION: 1. No acute fracture or dislocation.  PATIENT SURVEYS:  The Patient-Specific Functional Scale  Initial:  I am going to ask you to identify up to 3 important activities that you are unable to do or are having difficulty with as a result of this problem.  Today are there any activities that you are unable to do or having difficulty with because of this?  (Patient shown scale and patient rated each  activity)  Follow up: When you first came in you had difficulty performing these activities.  Today do you still have difficulty?  Patient-Specific activity scoring scheme (Point to one number):  0 1 2 3 4 5 6 7 8 9  10 Unable  Able to perform To perform                                                                                                    activity at the same Activity         Level as before                                                                                                                       Injury or problem  Activity           Walking at home with walker                                                                       Initial:         7              follow up: 02/16/24 -8  2.              Getting in/out of car                                                                     Initial:       7                follow up: 02/16/24-8   SCORE: 7   COGNITION: Overall cognitive status: Within functional limits for tasks assessed     SENSATION: Numb from feet to knees bil from stenosis  MUSCLE LENGTH: Not formally assessed but tight hip flexors secondary to flexed posture  POSTURE: rounded shoulders, forward head, increased thoracic kyphosis, and flexed trunk   PALPATION:   LUMBAR ROM:  Not formally assessed due to poor stability, lacks trunk extension to upright posture secondary to stenosis, lacks trunk rotation with functional mobility  LOWER EXTREMITY ROM:    WFL  LOWER EXTREMITY MMT:   Hips 4/5 bil Knees: quads 4+/5 bil, hamstrings 4-/5 bil Ankles: 3+/5 all directions bil    FUNCTIONAL TESTS:  5 times sit to stand: 20.35 with bil arm rests, trunk remains  flexed and Pt does not leg go of arm rests Timed up and go (TUG): 38.13 with 2-wheel walker  02/16/24:  TUG: 25.57 seconds  5x sit to stand: 17.08 seconds  with bil arm rests, trunk  remains flexed and Pt does not leg go of arm rests  02/18/24: 3 min walk test: 306 feet with 1-2/10 RPE  GAIT: Distance walked:  Assistive device utilized: Single point cane and Walker - 2 wheeled Level of assistance: Modified independence Comments: Pt needs min/mod assist x1 with cane use, mod ind with walker  TREATMENT DATE:  03/03/2024: Therapeutic Exercises NuStep: Level 5 (UE 10 / LE 8) x 8 minutes-PTA present to monitor pt Seated LAQ with 3 sec hold with 2# ankle weight 2x12 Seated marches with 2# ankle weights 2x12 Seated hip adduction with purple ball: 5 hold x 20 Seated shoulder ER with green theraband 2 x 10  Seated shoulder horizontal abduction with green tband 2x10 Seated hamstring curl with green tband 2x12 Standing heel raises at barre 2x10 Therapeutic Activities/Neuro Re education Standing toe taps to 6 facing barre 2x10 Standing on foam pad alternating hip abduction x10 Standing on foam pad alternating marches x10 Standing on foam pad weight shifts side to side x1 min   02/25/24: Therapeutic Exercises NuStep: Level 5 (UE 10 / LE 8) x 8 minutes-PTA present to monitor pt Seated:   2# each ankle for: Long arc quads 5 sec holds   2 x 12 each  Hip adduction with purple ball: 5 hold x 20      ER with red theraband 2 x 10   Row with red band 2 x 10 with demo and VC's during for correct scapular motion  Hamstring curl with green loop  x10 bil each Therapeutic Activities/Neuro Re education 4 step ups in // bars 2 x 5 each with PTA demo throughout for correct technique Bil hip 3 way raises on blue oval with 2# into flex, ext and abd (without blue oval due to ankle instability preventing even weight bearing on foot) x 10 each with demo throughout and VC's for correct technique and less UE support as able with erect posture Heel-toe walking front x 2 laps  02/18/23: Therapeutic Exercises NuStep: Level 5 (UE 10 / LE 8) x 8 minutes-PT present to discuss progress with pt   Seated:   2# each ankle for: Long arc quads 5 sec holds   2 x 12 each  Hip adduction with purple ball: 5 hold x20  Row with red band 2x10  ER with 2# weight x15 each    Hamstring curl with red loop 2x10 bil each Therapeutic Activities/Neuro reeducation 3 min walk with rolling walker: 306 feet   At barre:  Sidestepping x 3 laps  Shoulder flexion: 1# weights 2x10 bil each  Marching with 2# ankle weights: 2x10 bil with UE support.  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 Person educated: Patient and Child(ren) Education method: Demonstration, Verbal cues, and Handouts Education comprehension: verbalized understanding and returned demonstration, will benefit from further review  HOME EXERCISE PROGRAM: Access Code: [email or web address] URL: [email or web address] Date: 12/29/2023 Prepared by: Burnard  Exercises - Seated March  - 1 x daily - 7 x weekly - 2 sets - 20 reps - Seated Long Arc Quad  - 1 x daily - 7 x weekly - 2 sets - 10 reps - Seated Heel Toe Raises  - 2 x daily - 7 x weekly - 2 sets - 10 reps - Seated Isometric Hip Adduction with Ball  - 2 x daily - 7 x weekly - 2 sets - 10 reps - Seated Isometric Hip Abduction with Resistance  - 2 x daily - 7 x weekly - 2 sets - 10 reps  Access Code: [email or web address] URL: [email or web address] Date: 01/12/2024 Prepared by: Berwyn Knights  Exercises - Standing March with Counter Support  - 2 x daily - 7 x weekly - 1-2 sets - 10 reps - Standing Hip Abduction with Counter Support  - 2 x daily - 7 x weekly - 1-2 sets - 10 reps - Standing Hip Extension with Counter Support  - 2 x daily - 7 x weekly - 1-2 sets - 10 reps - Heel Toe Raises with Counter Support  - 2 x daily - 7 x weekly - 1-2 sets - 20 reps - Tricep Push Up on Wall  - 2 x daily - 7 x weekly - 2 sets - 10 reps  ASSESSMENT:  CLINICAL  IMPRESSION:  Mr Daoust reported to PT today reporting he is doing well. His son in law stated he missed the previous session due to some swelling in his lower legs and into his feet, he saw the doctor and they are monitoring the swelling. Today his strengthening was progressed, standing heel raises were added to his session which he tolerated well but had limited clearance of his heels reporting they were challenging. Through standing exercises he appeared to rely heavily on the barre for UE support and he required continuous cueing to stand erect posture. He will benefit from continued physical therapy to promote independence with his HEP and improve his bil LE strength and stability.   OBJECTIVE IMPAIRMENTS: Abnormal gait, decreased activity tolerance, decreased balance, decreased coordination, decreased endurance, decreased mobility, difficulty walking, decreased ROM, decreased strength, hypomobility, increased muscle spasms, impaired flexibility, impaired sensation, improper body mechanics, postural dysfunction, and pain.   ACTIVITY LIMITATIONS: lifting, bending, standing, squatting, stairs, transfers, dressing, and locomotion level  PARTICIPATION LIMITATIONS: community activity  PERSONAL FACTORS: Age and Time since onset of injury/illness/exacerbation are also affecting patient's functional outcome.   REHAB POTENTIAL: Excellent  CLINICAL DECISION MAKING: Stable/uncomplicated  EVALUATION COMPLEXITY: Low   GOALS: Goals reviewed with patient? Yes  SHORT TERM GOALS: Target date: 01/23/24  Pt will be ind with initial HEP Baseline: Goal status: MET  2.  Pt will be compliant with use of walker full time (vs SPC + mod A x1 of family member for outings). Baseline: 01/14/24 Goal status: MET  3.  Pt will report no falls at night with use of a bedside commode (PT advised getting one for nighttime safety for multiple bathroom trips per night). Baseline: walking to bathroom and no falls  (02/16/24) Goal status: MET    LONG TERM GOALS: Target date: 04/14/2024    Pt will be ind with targeted HEP and understand the importance of  compliance to maintain gains made in PT. Baseline: min to moderate compliance due to being sick and holidays (02/17/24) Goal status: In progress   2.  Demonstrate erect posture with use of walker with 50% fewer verbal cues during PT session Baseline: flexed posture with frequent verbal cues with standing (02/18/24) Goal status: NEW  3.  Pt will perform 5x sit to stand in 15 sec or less with use of bil UE to demo reduced fall risk Baseline: 17.08 (02/16/24) Goal status: In progress   4.  Improve LE strength to perform sit to stand with min to moderate UE support and stabilize independently Baseline: max UE support and use of hands on chair handles when standing (02/18/24) Goal status: NEW  5.  Pt will perform TUG in </= 21 sec with walker to demo improved functional mobility and reduced fall risk Baseline: 25.57 seconds with walker (02/16/24) Goal status: In progress   6.  Ambulate 400 feet in 3 minutes to improve community ambulation  Baseline: 306 feet with 1-2/10 RPE (02/18/24) Goal status: NEW  7. PSFS score will improve to at least 8 for both categories (household mobility, getting in/out of car) Baseline: 8/10  Goal status: MET  PLAN:  PT FREQUENCY: 1-2x/week  PT DURATION: 8 weeks  PLANNED INTERVENTIONS: 97110-Therapeutic exercises, 97530- Therapeutic activity, 97112- Neuromuscular re-education, 97535- Self Care, [id number]- Manual therapy, [id number]- Gait training, [phone]- Aquatic Therapy, [phone]- Traction (mechanical), [phone] (1-2 muscles), [phone] (3+ muscles)- Dry Needling, Patient/Family education, Balance training, Stair training, Joint mobilization, Spinal mobilization, DME instructions, Cryotherapy, and Moist heat.  PLAN FOR NEXT SESSION:  NuStep for endurance, ankle strength, add heel raises for ankle strength, review and progress seated HEP, work  on standing posture, work on sit to stand and ability to stabilize independently without Ues in standing  [employer], SPT 03/03/24, 4:15 PM  I agree with the following treatment note after reviewing documentation. This session was performed under the supervision of a licensed clinician.  Burnard Joy, PT 03/03/24 4:21 PM     "

## 2024-03-03 NOTE — Telephone Encounter (Signed)
 Copied from CRM #[id number]. Topic: Clinical - Lab/Test Results >> Mar 03, 2024 12:00 PM Berwyn MATSU wrote: Reason for CRM: Patients daughter called in to request an update on patients lab work. Patients daughter is requesting a call back.  May you please advise.

## 2024-03-04 NOTE — Telephone Encounter (Signed)
 Reviewed lab results with pt daughter who is listed in pt DPR,voiced understanding

## 2024-03-08 ENCOUNTER — Ambulatory Visit

## 2024-03-10 ENCOUNTER — Ambulatory Visit

## 2024-03-15 ENCOUNTER — Other Ambulatory Visit: Payer: Self-pay | Admitting: Family Medicine

## 2024-03-15 ENCOUNTER — Ambulatory Visit: Admitting: Physical Therapy

## 2024-03-15 DIAGNOSIS — E039 Hypothyroidism, unspecified: Secondary | ICD-10-CM

## 2024-03-16 ENCOUNTER — Other Ambulatory Visit: Payer: Self-pay | Admitting: Family Medicine

## 2024-03-17 ENCOUNTER — Other Ambulatory Visit: Payer: Self-pay | Admitting: Family Medicine

## 2024-03-17 ENCOUNTER — Ambulatory Visit

## 2024-03-17 DIAGNOSIS — M25511 Pain in right shoulder: Secondary | ICD-10-CM

## 2024-03-22 ENCOUNTER — Ambulatory Visit: Admitting: Physical Therapy

## 2024-03-24 ENCOUNTER — Ambulatory Visit

## 2024-03-29 ENCOUNTER — Ambulatory Visit: Admitting: Physical Therapy
